# Patient Record
Sex: Female | Born: 1955 | Race: Black or African American | Hispanic: No | Marital: Single | Smoking: Never smoker
Health system: Southern US, Community
[De-identification: ages and names within clinical notes are randomized; demographics above are authoritative.]

## PROBLEM LIST (undated history)

## (undated) DIAGNOSIS — I739 Peripheral vascular disease, unspecified: Secondary | ICD-10-CM

## (undated) DIAGNOSIS — E785 Hyperlipidemia, unspecified: Secondary | ICD-10-CM

## (undated) DIAGNOSIS — A630 Anogenital (venereal) warts: Secondary | ICD-10-CM

## (undated) DIAGNOSIS — I219 Acute myocardial infarction, unspecified: Secondary | ICD-10-CM

## (undated) DIAGNOSIS — I10 Essential (primary) hypertension: Secondary | ICD-10-CM

## (undated) DIAGNOSIS — Z95820 Peripheral vascular angioplasty status with implants and grafts: Secondary | ICD-10-CM

## (undated) HISTORY — DX: Acute myocardial infarction, unspecified: I21.9

## (undated) HISTORY — DX: Anogenital (venereal) warts: A63.0

## (undated) HISTORY — DX: Peripheral vascular angioplasty status with implants and grafts: Z95.820

## (undated) HISTORY — DX: Hyperlipidemia, unspecified: E78.5

## (undated) HISTORY — DX: Peripheral vascular disease, unspecified: I73.9

## (undated) HISTORY — DX: Essential (primary) hypertension: I10

---

## 2006-07-08 HISTORY — PX: ANGIOPLASTY / STENTING FEMORAL: SUR30

## 2008-08-31 ENCOUNTER — Encounter: Admission: RE | Admit: 2008-08-31 | Discharge: 2008-08-31 | Payer: Self-pay | Admitting: General Practice

## 2009-09-04 ENCOUNTER — Ambulatory Visit: Payer: Self-pay | Admitting: Surgery

## 2009-09-11 ENCOUNTER — Ambulatory Visit: Payer: Self-pay | Admitting: Surgery

## 2009-09-11 ENCOUNTER — Encounter: Admission: RE | Admit: 2009-09-11 | Discharge: 2009-09-11 | Payer: Self-pay | Admitting: Surgery

## 2010-10-02 ENCOUNTER — Other Ambulatory Visit: Payer: Self-pay | Admitting: Orthopedic Surgery

## 2010-10-02 DIAGNOSIS — M25511 Pain in right shoulder: Secondary | ICD-10-CM

## 2010-10-08 ENCOUNTER — Other Ambulatory Visit: Payer: Self-pay

## 2010-10-13 ENCOUNTER — Other Ambulatory Visit: Payer: Self-pay

## 2010-10-15 ENCOUNTER — Ambulatory Visit
Admission: RE | Admit: 2010-10-15 | Discharge: 2010-10-15 | Disposition: A | Discharge status: Home / Self Care | Payer: BC Managed Care – PPO | Source: Ambulatory Visit | Attending: Orthopedic Surgery | Admitting: Orthopedic Surgery

## 2010-10-15 DIAGNOSIS — M25511 Pain in right shoulder: Secondary | ICD-10-CM

## 2010-10-30 ENCOUNTER — Encounter (HOSPITAL_COMMUNITY)
Admission: RE | Admit: 2010-10-30 | Discharge: 2010-10-30 | Disposition: A | Discharge status: Home / Self Care | Payer: BC Managed Care – PPO | Source: Ambulatory Visit | Attending: Orthopedic Surgery | Admitting: Orthopedic Surgery

## 2010-10-30 ENCOUNTER — Other Ambulatory Visit (HOSPITAL_COMMUNITY): Payer: Self-pay | Admitting: Orthopedic Surgery

## 2010-10-30 DIAGNOSIS — M7540 Impingement syndrome of unspecified shoulder: Secondary | ICD-10-CM

## 2010-10-30 LAB — COMPREHENSIVE METABOLIC PANEL
ALT: 33 U/L (ref 0–35)
AST: 25 U/L (ref 0–37)
Albumin: 4 g/dL (ref 3.5–5.2)
Alkaline Phosphatase: 68 U/L (ref 39–117)
BUN: 28 mg/dL — ABNORMAL HIGH (ref 6–23)
CO2: 29 mEq/L (ref 19–32)
Calcium: 10.1 mg/dL (ref 8.4–10.5)
Chloride: 109 mEq/L (ref 96–112)
Creatinine, Ser: 0.87 mg/dL (ref 0.4–1.2)
GFR calc Af Amer: >60 mL/min (ref >60)
GFR calc non Af Amer: >60 mL/min (ref >60)
Glucose, Bld: 102 mg/dL — ABNORMAL HIGH (ref 70–99)
Potassium: 4.8 mEq/L (ref 3.5–5.1)
Sodium: 143 mEq/L (ref 135–145)
Total Bilirubin: 0.3 mg/dL (ref 0.3–1.2)
Total Protein: 6.7 g/dL (ref 6.0–8.3)

## 2010-10-30 LAB — URINALYSIS, ROUTINE W REFLEX MICROSCOPIC
Bilirubin Urine: NEGATIVE
Glucose, UA: NEGATIVE mg/dL
Hgb urine dipstick: NEGATIVE
Ketones, ur: NEGATIVE mg/dL
Nitrite: NEGATIVE
Protein, ur: NEGATIVE mg/dL
Specific Gravity, Urine: 1.025 (ref 1.005–1.030)
Urobilinogen, UA: 0.2 mg/dL (ref 0.0–1.0)
pH: 5 (ref 5.0–8.0)

## 2010-10-30 LAB — URINE MICROSCOPIC-ADD ON

## 2010-10-30 LAB — CBC
HCT: 45.2 % (ref 36.0–46.0)
Hemoglobin: 15.1 g/dL — ABNORMAL HIGH (ref 12.0–15.0)
MCH: 28.1 pg (ref 26.0–34.0)
MCHC: 33.4 g/dL (ref 30.0–36.0)
MCV: 84 fL (ref 78.0–100.0)
Platelets: 207 10*3/uL (ref 150–400)
RBC: 5.38 MIL/uL — ABNORMAL HIGH (ref 3.87–5.11)
RDW: 14.5 % (ref 11.5–15.5)
WBC: 9.5 10*3/uL (ref 4.0–10.5)

## 2010-10-30 LAB — PROTIME-INR
INR: 0.92 (ref 0.00–1.49)
Prothrombin Time: 12.6 seconds (ref 11.6–15.2)

## 2010-10-30 LAB — SURGICAL PCR SCREEN
MRSA, PCR: NEGATIVE
Staphylococcus aureus: NEGATIVE

## 2010-10-30 LAB — APTT: aPTT: 25 seconds (ref 24–37)

## 2010-11-05 ENCOUNTER — Ambulatory Visit (HOSPITAL_COMMUNITY)
Admission: RE | Admit: 2010-11-05 | Discharge: 2010-11-05 | Disposition: A | Discharge status: Home / Self Care | Payer: BC Managed Care – PPO | Source: Ambulatory Visit | Attending: Orthopedic Surgery | Admitting: Orthopedic Surgery

## 2010-11-05 ENCOUNTER — Ambulatory Visit (HOSPITAL_BASED_OUTPATIENT_CLINIC_OR_DEPARTMENT_OTHER)
Admission: RE | Admit: 2010-11-05 | Discharge: 2010-11-05 | Disposition: A | Discharge status: Home / Self Care | Payer: BC Managed Care – PPO | Source: Ambulatory Visit | Attending: Orthopedic Surgery | Admitting: Orthopedic Surgery

## 2010-11-05 DIAGNOSIS — M67919 Unspecified disorder of synovium and tendon, unspecified shoulder: Secondary | ICD-10-CM | POA: Insufficient documentation

## 2010-11-05 DIAGNOSIS — Z01812 Encounter for preprocedural laboratory examination: Secondary | ICD-10-CM | POA: Insufficient documentation

## 2010-11-05 DIAGNOSIS — M719 Bursopathy, unspecified: Secondary | ICD-10-CM | POA: Insufficient documentation

## 2010-11-05 DIAGNOSIS — I739 Peripheral vascular disease, unspecified: Secondary | ICD-10-CM | POA: Insufficient documentation

## 2010-11-05 DIAGNOSIS — M19019 Primary osteoarthritis, unspecified shoulder: Secondary | ICD-10-CM | POA: Insufficient documentation

## 2010-11-05 DIAGNOSIS — M25819 Other specified joint disorders, unspecified shoulder: Secondary | ICD-10-CM | POA: Insufficient documentation

## 2010-11-05 DIAGNOSIS — E669 Obesity, unspecified: Secondary | ICD-10-CM | POA: Insufficient documentation

## 2010-11-05 DIAGNOSIS — Z01810 Encounter for preprocedural cardiovascular examination: Secondary | ICD-10-CM | POA: Insufficient documentation

## 2010-11-05 DIAGNOSIS — Z01818 Encounter for other preprocedural examination: Secondary | ICD-10-CM | POA: Insufficient documentation

## 2010-11-05 DIAGNOSIS — I10 Essential (primary) hypertension: Secondary | ICD-10-CM | POA: Insufficient documentation

## 2010-11-13 NOTE — Op Note (Signed)
  NAMETRU, LEOPARD               ACCOUNT NO.:  [id number]  MEDICAL RECORD NO.:  [id number]           PATIENT TYPE:  O  LOCATION:                                FACILITY:  [facility]  PHYSICIAN:  Loreta Ave, M.D. DATE OF BIRTH:  02/20/1956  DATE OF PROCEDURE:  11/07/2010 DATE OF DISCHARGE:  11/05/2010                              OPERATIVE REPORT   PREOPERATIVE DIAGNOSES:  Right shoulder chronic impingement, partial rotator cuff tear, distal clavicle osteolysis.  POSTOPERATIVE DIAGNOSES:  Right shoulder chronic impingement, partial rotator cuff tear, distal clavicle osteolysis with very superficial tearing rotator cuff above.  PROCEDURES:  Right shoulder exam under anesthesia, arthroscopy with bursectomy, rotator cuff debridement, acromioplasty, CA ligament release, excision of distal clavicle.  SURGEON:  Loreta Ave, MD  ASSISTANT:  Zonia Kief, PA present throughout the entire case and necessary for timely completion of procedure.  ANESTHESIA:  General.  ESTIMATED BLOOD LOSS:  Minimal.  SPECIMENS:  None.  CULTURES:  None.  COMPLICATIONS:  None.  DRESSINGS:  Soft compressive with sling.  PROCEDURE:  The patient was brought to the operating room, placed on the operating table in a supine position.  After adequate anesthesia had been obtained, shoulder examined.  Full motion stable shoulder.  Placed in beach-chair position on the shoulder positioner.  Prepped and draped in usual sterile fashion.  Three portals anterior, posterior, and lateral.  Arthroscope introduced, shoulder distended and inspected.  The interior of the shoulder looked good.  Articular cartilage, biceps tendon, biceps anchor, labrum intact.  A little roughening on the undersurface of the cuff where she has tendinopathy, but no structural tearing seen from below.  Articular cartilage looked good.  Cannula redirected subacromially.  Type 2 to 3 acromion.  Bursa resected. Abrasive changes on  the top of the cuff debrided.  Acromioplasty to a type 1 acromion with shaver and high-speed bur.  CA ligament released with cautery.  Adequacy of decompression confirmed.  Distal clavicle grade 4 changes.  Periarticular spurs.  Periarticular spurs and lateral centimeter of clavicle resected.  Adequacy of decompression confirmed viewing from all portals.  Top of the cuff thoroughly inspected.  No other repair necessary.  Instruments and fluid removed.  Portals of shoulder bursa injected with Marcaine.  Portals closed with nylon.  Sterile compressive dressing applied.  Sling applied.  Anesthesia reversed.  Brought to the recovery room.  Tolerated surgery well.  No complications.     Loreta Ave, M.D.     DFM/MEDQ  D:  11/07/2010  T:  11/08/2010  Job:  [id number]  Electronically Signed by Mckinley Jewel M.D. on 11/13/2010 01:32:22 PM

## 2010-11-20 NOTE — Assessment & Plan Note (Signed)
OFFICE VISIT   Debbie [name], Debbie [name]  DOB:  [birth year]                                       09/11/2009  CHART#:[id number]   REASON FOR VISIT:  Follow-up.   HISTORY:  Patient is a 55 year old female that came in to see me last  week at the request of Dr. Eulah Pont for evaluation of right leg numbness.  This was a new finding for her.  It happened while she was working out  with a Systems analyst.  She has undergone percutaneous intervention in  her right leg in [place].  This was a Viabahn stent to her right SFA  and popliteal artery.  Her ABIs when I last saw her were 0.6 on the  right and 1 on the left.  She also had a small ulcer on her ankle.  I  had planned to get an arteriogram; however, she said that she had  bleeding problems in the past with an arteriogram; therefore, I had  scheduled her for a CT scans instead.  She comes back in today to  discuss the results.  The patient states that she gets numbness with  walking around.  When she rests, it goes away.  She is not having any  acute ischemic symptoms.   PHYSICAL EXAMINATION:  Heart rate 87.  Blood pressure is 136/83.  Respirations are 20.  General:  She is in no acute distress.  Respirations are not labored.  Extremities are warm and well-perfused.  The ulcer that was present last week atraumatically improved.   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   ASSESSMENT:  Right leg numbness.   PLAN:  I certainly feel that with the occlusion of her right leg stent,  that some of her symptoms are related to arterial insufficiency.  She  does not, however, given off the impression that this is limb-  threatening.  Her symptoms appear to be relatively stable and mild.  She  is not having any rest pain, and she has been able to heal a wound on  her ankle.  I discussed that I think our best course of action is  continued exercise, to help  improve the distance walking she can  tolerate.  I have also given her prescription for cilostazol to see if  this helps her symptoms.  I will plan on seeing her back in 6 months or  sooner if she has worsening problems.  I have also told her to continue  to explore her back as being a potential source of the numbness.     Jorge Ny, MD  Electronically Signed   VWB/MEDQ  D:  09/11/2009  T:  09/12/2009  Job:  [id number]   cc:   Loreta Ave, M.D.

## 2010-11-20 NOTE — Assessment & Plan Note (Signed)
OFFICE VISIT   Debbie [name], Debbie [name]  DOB:  March 10, 1956                                       09/04/2009  CHART#:[id number]   REASON FOR VISIT:  Right leg numbness.   REFERRING PHYSICIAN:  Loreta Ave, M.D.   HISTORY:  This is a 55 year old female who I am seeing at request of Dr.  Eulah Pont for evaluation of right leg numbness and pain.  The patient  states this has been going on since this past Friday.  It happened while  she was working with her Systems analyst.  Currently she is doing  multiple exercises, including lunges, which after her work out, she  suffers numbness in her right leg.  She is also getting numbness at home  without significant activity.  Her feet feel better after icing them  following her workouts.   The patient has a history of a nonhealing right lateral ankle stasis  ulcer in [place], [place].  She underwent placement of a right above-  knee 6 x 10 Viabahn stent following suboptimal results from a popliteal  angioplasty.  Operative findings from that case included an occluded  right above-knee popliteal artery, as well as a tibioperoneal trunk  occlusion and single-vessel runoff via the peroneal artery.  Patient  also says that she has been told she has degenerative disk disease that  could potentially causing her symptoms.   REVIEW OF SYSTEMS:  Positive for weight loss, otherwise negative except  for what is documented above, negative x 10 and detailed in the  encounter form.   PAST MEDICAL HISTORY:  Hypercholesterolemia, history of an MI.   FAMILY HISTORY:  Negative for cardiovascular disease at an early age.   SOCIAL HISTORY:  She is single.  She works as an Charity fundraiser.  Does not smoke,  does not drink.   ALLERGIES:  Sulfa.   PHYSICAL EXAMINATION:  Heart rate is 69, blood pressure 147/90,  temperature is 98.2.  General:  She is well-appearing in no distress.  HEENT:  Within normal limits.  Respirations are nonlabored.  Her  abdomen  is obese.  Musculoskeletal is without major deformities.  Neuro:  She  has no focal deficits.  Skin without rash.  Extremities are warm and  well-perfused.  Pedal pulses are not palpable on the right.   DIAGNOSTIC STUDIES:  Ultrasound has been independently reviewed.  This  reveals an ABI of 0.6 on the right and 1.0 on the left.   ASSESSMENT:  Right leg numbness.   PLAN:  I have recommended that we proceed with arteriogram to further  delineate the patient's anatomy, given that she has significant  atherosclerotic changes in her right leg.  She told me that her of  procedure in [place] was complicated by significant left groin hematoma  which required inpatient hospitalization.  She is a little reluctant to  proceed with arteriogram; therefore, I have recommended that we obtain a  CT angiogram of the abdomen and pelvis with bilateral runoff to serve as  our baseline.  I stated that if her CT angiogram shows reveals the  findings that are detailed in the operative report from her initial  study in 2008, I feel that her symptoms would be most likely neuropathic  in origin and that we should focus on the back.  If however we find that  her Viobahn stent  is occluded, we would need to proceed with an  arteriogram to figure out our next options to revascularize her leg.  I  have encouraged her to continue with her exercises.  She will not do any  damage by continuing to exercise.  I will see her back in a week after  her study.     Jorge Ny, MD  Electronically Signed   VWB/MEDQ  D:  09/04/2009  T:  09/05/2009  Job:  [id number]   cc:   Loreta Ave, M.D.

## 2011-08-01 ENCOUNTER — Ambulatory Visit (INDEPENDENT_AMBULATORY_CARE_PROVIDER_SITE_OTHER): Payer: No Typology Code available for payment source | Admitting: Vascular Surgery

## 2011-08-01 ENCOUNTER — Other Ambulatory Visit (INDEPENDENT_AMBULATORY_CARE_PROVIDER_SITE_OTHER): Payer: BC Managed Care – PPO | Admitting: *Deleted

## 2011-08-01 ENCOUNTER — Encounter: Payer: Self-pay | Admitting: Vascular Surgery

## 2011-08-01 ENCOUNTER — Other Ambulatory Visit: Payer: Self-pay | Admitting: *Deleted

## 2011-08-01 VITALS — BP 153/82 | HR 84 | Resp 16 | Ht 65.0 in | Wt 279.0 lb

## 2011-08-01 DIAGNOSIS — Z9861 Coronary angioplasty status: Secondary | ICD-10-CM

## 2011-08-01 DIAGNOSIS — M79609 Pain in unspecified limb: Secondary | ICD-10-CM | POA: Insufficient documentation

## 2011-08-01 DIAGNOSIS — Z95820 Peripheral vascular angioplasty status with implants and grafts: Secondary | ICD-10-CM | POA: Insufficient documentation

## 2011-08-01 DIAGNOSIS — I739 Peripheral vascular disease, unspecified: Secondary | ICD-10-CM

## 2011-08-01 DIAGNOSIS — I70219 Atherosclerosis of native arteries of extremities with intermittent claudication, unspecified extremity: Secondary | ICD-10-CM

## 2011-08-01 NOTE — Progress Notes (Signed)
[facility] [place] HISTORY AND PHYSICAL   History of Present Illness:  Patient is a 55 y.o. year old female who presents for evaluation of right leg pain. The patient was previously seen by Dr. Brabham in March of 2011. At that time she was noted to have occlusion of her right superficial femoral artery Viabahn stent. This had been placed in [place] and 2008. At that time Dr. Brabham opted for conservative management with Pletal and a walking program. The patient was initially compliant with this and lost approximately 50 pounds. However she began to have some problems with other physical ailments and stopped walking. She has noticed worsening symptoms in her right leg since that time. She is also regained all of her weight. She has some complaints of aching this and tingling in her right foot at night, and she sleeping. She gets up and walks around this improves. She has not really ambulatory enough to really describe classic claudication symptoms. She does have some vague complaints of calf pain with walking. She states her symptoms got worse over the last few days.  Other medical problems include hypertension. She also has a history of hyperlipidemia and coronary artery disease. These apparently are stable currently. She states that she was on a statin in the past but stopped this because she was worried about potential side effects.  She does take aspirin.  Past Medical History  Diagnosis Date  . Peripheral arterial disease   . Status post angioplasty with stent     Known femoral stent occlusion  . Myocardial infarction   . Hyperlipidemia     Past Surgical History  Procedure Date  . Angioplasty / stenting femoral 2008    Right femoral artery stenting done in [place]     Social History History  Substance Use Topics  . Smoking status: Never Smoker   . Smokeless tobacco: Not on file  . Alcohol Use: No    Family History No family history on  file.  Allergies  Allergies  Allergen Reactions  . Sulfa Antibiotics      No current outpatient prescriptions on file.    ROS:   General:  No weight loss, Fever, chills  HEENT: No recent headaches, no nasal bleeding, no visual changes, no sore throat  Neurologic: No dizziness, blackouts, seizures. No recent symptoms of stroke or mini- stroke. No recent episodes of slurred speech, or temporary blindness.  Cardiac: No recent episodes of chest pain/pressure, no shortness of breath at rest.  No shortness of breath with exertion.  Denies history of atrial fibrillation or irregular heartbeat  Vascular: No history of rest pain in feet.  No history of claudication.  No history of non-healing ulcer, No history of DVT   Pulmonary: No home oxygen, no productive cough, no hemoptysis,  No asthma or wheezing  Musculoskeletal:  [ ] Arthritis, [ ] Low back pain,  [ ] Joint pain  Hematologic:No history of hypercoagulable state.  No history of easy bleeding.  No history of anemia  Gastrointestinal: No hematochezia or melena,  No gastroesophageal reflux, no trouble swallowing  Urinary: [ ] chronic Kidney disease, [ ] on HD - [ ] MWF or [ ] TTHS, [ ] Burning with urination, [ ] Frequent urination, [ ] Difficulty urinating;   Skin: No rashes  Psychological: No history of anxiety,  No history of depression   Physical Examination  Filed Vitals:   08/01/11 1132  BP: 153/82  Pulse: 84  Resp: 16    Height: 5' 5" (1.651 m)  Weight: 279 lb (126.554 kg)  SpO2: 97%    Body mass index is 46.43 kg/(m^2).  General:  Alert and oriented, no acute distress HEENT: Normal Neck: No bruit or JVD Pulmonary: Clear to auscultation bilaterally Cardiac: Regular Rate and Rhythm without murmur Abdomen: Soft, non-tender, non-distended, no mass, no scars, very obese Skin: No rash Extremity Pulses:  2+ radial, brachial, femoral, dorsalis pedis, posterior tibial pulses left leg, right leg absent posterior  tibial and dorsalis pedis pulse Musculoskeletal: No deformity or edema  Neurologic: Upper and lower extremity motor 5/5 and symmetric  DATA: She had bilateral ABIs performed today which are reviewed and interpreted. Left ABI was 0.99 with biphasic waveforms right ABI was 0.39 which is a decline of approximately 30% since her previous ABI of 0.6 one year ago   ASSESSMENT: Claudication right lower extremity with early rest pain, obesity, multiple risk factors for peripheral arterial disease   PLAN: Risks benefits possible complications and procedure details of arteriogram was explained to the patient today. I believe she needs this for operative planning purposes. These risks were including but limited to bleeding infection vessel injury contrast reaction. We will also schedule her for a cardiology risk stratification visit. She will most likely need a right femoropopliteal bypass to improve perfusion in her right lower extremity since she currently has rest pain.  Kristi Norment, MD [facility] [place] Office: [phone] Pager: [phone]   Tieasha Larsen, MD [facility] [place] Office: [phone] Pager: [phone]  

## 2011-08-02 ENCOUNTER — Other Ambulatory Visit: Payer: Self-pay

## 2011-08-02 ENCOUNTER — Ambulatory Visit: Payer: BC Managed Care – PPO | Admitting: Cardiovascular Disease

## 2011-08-06 ENCOUNTER — Encounter (HOSPITAL_COMMUNITY): Payer: Self-pay | Admitting: Pharmacy Technician

## 2011-08-08 MED ORDER — SODIUM CHLORIDE 0.9 % IV SOLN
INTRAVENOUS | Status: DC
  Administered 2011-08-09: 1000 mL via INTRAVENOUS

## 2011-08-09 ENCOUNTER — Encounter (HOSPITAL_COMMUNITY)
Admission: RE | Disposition: A | Discharge status: Home / Self Care | Payer: Self-pay | Source: Ambulatory Visit | Attending: Vascular Surgery

## 2011-08-09 ENCOUNTER — Ambulatory Visit (HOSPITAL_COMMUNITY)
Admission: RE | Admit: 2011-08-09 | Discharge: 2011-08-09 | Disposition: A | Discharge status: Home / Self Care | Payer: BC Managed Care – PPO | Source: Ambulatory Visit | Attending: Vascular Surgery | Admitting: Vascular Surgery

## 2011-08-09 ENCOUNTER — Other Ambulatory Visit: Payer: Self-pay

## 2011-08-09 DIAGNOSIS — E785 Hyperlipidemia, unspecified: Secondary | ICD-10-CM | POA: Insufficient documentation

## 2011-08-09 DIAGNOSIS — I70219 Atherosclerosis of native arteries of extremities with intermittent claudication, unspecified extremity: Secondary | ICD-10-CM

## 2011-08-09 DIAGNOSIS — I10 Essential (primary) hypertension: Secondary | ICD-10-CM | POA: Insufficient documentation

## 2011-08-09 DIAGNOSIS — I739 Peripheral vascular disease, unspecified: Secondary | ICD-10-CM

## 2011-08-09 DIAGNOSIS — E669 Obesity, unspecified: Secondary | ICD-10-CM | POA: Insufficient documentation

## 2011-08-09 DIAGNOSIS — I252 Old myocardial infarction: Secondary | ICD-10-CM | POA: Insufficient documentation

## 2011-08-09 DIAGNOSIS — I2510 Atherosclerotic heart disease of native coronary artery without angina pectoris: Secondary | ICD-10-CM | POA: Insufficient documentation

## 2011-08-09 HISTORY — PX: FEMORAL BYPASS: SHX50

## 2011-08-09 HISTORY — PX: ABDOMINAL AORTAGRAM: SHX5454

## 2011-08-09 LAB — POCT I-STAT, CHEM 8
BUN: 24 mg/dL — ABNORMAL HIGH (ref 6–23)
Calcium, Ion: 1.21 mmol/L (ref 1.12–1.32)
Chloride: 111 mEq/L (ref 96–112)
Creatinine, Ser: 0.9 mg/dL (ref 0.50–1.10)
Glucose, Bld: 118 mg/dL — ABNORMAL HIGH (ref 70–99)
HCT: 41 % (ref 36.0–46.0)
Hemoglobin: 13.9 g/dL (ref 12.0–15.0)
Potassium: 3.7 mEq/L (ref 3.5–5.1)
Sodium: 145 mEq/L (ref 135–145)
TCO2: 24 mmol/L (ref 0–100)

## 2011-08-09 LAB — HEMOGLOBIN A1C
Hgb A1c MFr Bld: 6.8 % — ABNORMAL HIGH (ref <5.7)
Mean Plasma Glucose: 148 mg/dL — ABNORMAL HIGH (ref <117)

## 2011-08-09 LAB — GLUCOSE, CAPILLARY: Glucose-Capillary: 109 mg/dL — ABNORMAL HIGH (ref 70–99)

## 2011-08-09 SURGERY — ABDOMINAL AORTAGRAM
Anesthesia: LOCAL

## 2011-08-09 MED ORDER — LIDOCAINE HCL (PF) 1 % IJ SOLN
INTRAMUSCULAR | Status: AC
  Filled 2011-08-09: qty 30

## 2011-08-09 MED ORDER — HEPARIN (PORCINE) IN NACL 2-0.9 UNIT/ML-% IJ SOLN
INTRAMUSCULAR | Status: AC
  Filled 2011-08-09: qty 1000

## 2011-08-09 MED ORDER — MIDAZOLAM HCL 2 MG/2ML IJ SOLN
INTRAMUSCULAR | Status: AC
  Filled 2011-08-09: qty 2

## 2011-08-09 MED ORDER — FENTANYL CITRATE 0.05 MG/ML IJ SOLN
INTRAMUSCULAR | Status: AC
  Filled 2011-08-09: qty 2

## 2011-08-09 NOTE — H&P (View-Only) (Signed)
[facility] [place] HISTORY AND PHYSICAL   History of Present Illness:  Patient is a 56 y.o. year old female who presents for evaluation of right leg pain. The patient was previously seen by Dr. Myra Gianotti in March of 2011. At that time she was noted to have occlusion of her right superficial femoral artery Viabahn stent. This had been placed in [place] and 2008. At that time Dr. Myra Gianotti opted for conservative management with Pletal and a walking program. The patient was initially compliant with this and lost approximately 50 pounds. However she began to have some problems with other physical ailments and stopped walking. She has noticed worsening symptoms in her right leg since that time. She is also regained all of her weight. She has some complaints of aching this and tingling in her right foot at night, and she sleeping. She gets up and walks around this improves. She has not really ambulatory enough to really describe classic claudication symptoms. She does have some vague complaints of calf pain with walking. She states her symptoms got worse over the last few days.  Other medical problems include hypertension. She also has a history of hyperlipidemia and coronary artery disease. These apparently are stable currently. She states that she was on a statin in the past but stopped this because she was worried about potential side effects.  She does take aspirin.  Past Medical History  Diagnosis Date  . Peripheral arterial disease   . Status post angioplasty with stent     Known femoral stent occlusion  . Myocardial infarction   . Hyperlipidemia     Past Surgical History  Procedure Date  . Angioplasty / stenting femoral 2008    Right femoral artery stenting done in [place]     Social History History  Substance Use Topics  . Smoking status: Never Smoker   . Smokeless tobacco: Not on file  . Alcohol Use: No    Family History No family history on  file.  Allergies  Allergies  Allergen Reactions  . Sulfa Antibiotics      No current outpatient prescriptions on file.    ROS:   General:  No weight loss, Fever, chills  HEENT: No recent headaches, no nasal bleeding, no visual changes, no sore throat  Neurologic: No dizziness, blackouts, seizures. No recent symptoms of stroke or mini- stroke. No recent episodes of slurred speech, or temporary blindness.  Cardiac: No recent episodes of chest pain/pressure, no shortness of breath at rest.  No shortness of breath with exertion.  Denies history of atrial fibrillation or irregular heartbeat  Vascular: No history of rest pain in feet.  No history of claudication.  No history of non-healing ulcer, No history of DVT   Pulmonary: No home oxygen, no productive cough, no hemoptysis,  No asthma or wheezing  Musculoskeletal:  [ ]  Arthritis, [ ]  Low back pain,  [ ]  Joint pain  Hematologic:No history of hypercoagulable state.  No history of easy bleeding.  No history of anemia  Gastrointestinal: No hematochezia or melena,  No gastroesophageal reflux, no trouble swallowing  Urinary: [ ]  chronic Kidney disease, [ ]  on HD - [ ]  MWF or [ ]  TTHS, [ ]  Burning with urination, [ ]  Frequent urination, [ ]  Difficulty urinating;   Skin: No rashes  Psychological: No history of anxiety,  No history of depression   Physical Examination  Filed Vitals:   08/01/11 1132  BP: 153/82  Pulse: 84  Resp: 16  Height: 5\' 5"  (1.651 m)  Weight: 279 lb (126.554 kg)  SpO2: 97%    Body mass index is 46.43 kg/(m^2).  General:  Alert and oriented, no acute distress HEENT: Normal Neck: No bruit or JVD Pulmonary: Clear to auscultation bilaterally Cardiac: Regular Rate and Rhythm without murmur Abdomen: Soft, non-tender, non-distended, no mass, no scars, very obese Skin: No rash Extremity Pulses:  2+ radial, brachial, femoral, dorsalis pedis, posterior tibial pulses left leg, right leg absent posterior  tibial and dorsalis pedis pulse Musculoskeletal: No deformity or edema  Neurologic: Upper and lower extremity motor 5/5 and symmetric  DATA: She had bilateral ABIs performed today which are reviewed and interpreted. Left ABI was 0.99 with biphasic waveforms right ABI was 0.39 which is a decline of approximately 30% since her previous ABI of 0.6 one year ago   ASSESSMENT: Claudication right lower extremity with early rest pain, obesity, multiple risk factors for peripheral arterial disease   PLAN: Risks benefits possible complications and procedure details of arteriogram was explained to the patient today. I believe she needs this for operative planning purposes. These risks were including but limited to bleeding infection vessel injury contrast reaction. We will also schedule her for a cardiology risk stratification visit. She will most likely need a right femoropopliteal bypass to improve perfusion in her right lower extremity since she currently has rest pain.  Fabienne Bruns, MD [facility] [place] Office: [phone] Pager: [phone]   Fabienne Bruns, MD [facility] [place] Office: [phone] Pager: [phone]

## 2011-08-09 NOTE — Op Note (Signed)
Procedure: Aortogram with bilateral lower extremity runoff  Preoperative diagnosis: Claudication  Postoperative diagnosis: Same  Anesthesia Local  Operative details: After obtaining informed consent, the patient was taken to the [facility] LAB. The patient was placed in supine position on the Angio table. Both groins were prepped and draped in usual sterile fashion. Local anesthesia was infiltrated over the left common femoral artery. Initially, ultrasound was used to identify the common femoral artery and an attempt was made to use a micropuncture to establish access.  After 2 passes I felt that I could not visualized the artery very well so I reverted to a standard technique of palpation.  An introducer needle was used to cannulate the left common femoral artery and 035 versacore wire threaded into the abdominal aorta under fluoroscopic guidance. Next a 5 French sheath is placed over the guidewire in the left common femoral artery. A 5 French pigtail catheter was placed over the guidewire into the abdominal aorta and abdominal aortogram was obtained. The infrarenal abdominal aorta is patent. The left and right common internal and external iliac arteries are patent.    In order to get increased opacification of the tibials a 5 Fr crossover catheter was brought up on the field.  The crossover catheter was used to selectively catheterize the right common iliac artery and the guidewire advanced into the right distal external iliac artery. The crossover catheter was removed and replaced with a 5 French straight catheter. Angiogram was then performed the right lower extremity.  In the right leg, the common femoral artery is patent.  The right SFA has a Viabahn stent in its distal portion which is occluded. The stent extends into the above knee popliteal artery which is also occluded.  The profunda is patent.  The below knee popliteal artery is patent and reconstituted by SFA and profunda collaterals. The posterior  tibial artery is occluded.  The peroneal artery is patent to the ankle but diseased diffusely.  The anterior tibial artery is occluded at its origin but refills distally via collaterals.  It is a small vessel.  The peroneal artery is the primary runoff vessel and fills the DP artery which does fill a complete plantar arch.    Next the 5 French straight catheter was removed over a guidewire. The 5 French sheath was then connected to obtain a left lower extremity runoff.  In the left leg, the common femoral artery is patent.  The left SFA is patent. The profunda is patent.  The popliteal artery is patent. The posterior tibial artery is occluded.  The peroneal artery is occluded at its origin but does fill late via collaterals but is diminutive.  The anterior tibial artery is the dominant runoff to the foot. This is in continuity to the DP.  An attempt was then made to use a Proglide suture device.  I place the [employer] through the 5 Fr sheath and exchanged for a 6 Fr Proglide.  However, due to the patients obesity the proglide was bowing in its central portion and would not easily advance into the artery so attempt at closure was aborted.  The Proglide was removed over the [facility] wire and a 6 Fr sheath placed in the left femoral artery.  The 6 Fr sheath was left in place to be pulled in the holding area. The patient tolerated the procedure well and there were no complications. Patient was taken to the holding area in stable condition.  Operative findings: Left leg- patent inflow, SFA,  profunda, popliteal, 1 vessel runoff via the peroneal Right leg- SFA occlusion, patent below knee popliteal one vessel runoff via the AT There is a complete plantar arch.     Operative management: The patient will be scheduled for a right femoral to below knee bypass in the near future.  Fabienne Bruns, MD [facility] [place] Office: [phone] Pager: [phone]

## 2011-08-09 NOTE — Interval H&P Note (Signed)
History and Physical Interval Note:  08/09/2011 7:58 AM  Debbie [name]  has presented today for surgery, with the diagnosis of PVD  The various methods of treatment have been discussed with the patient and family. After consideration of risks, benefits and other options for treatment, the patient has consented to  Procedure(s): ABDOMINAL AORTAGRAM as a surgical intervention .  The patients' history has been reviewed, patient examined, no change in status, stable for surgery.  I have reviewed the patients' chart and labs.  Questions were answered to the patient's satisfaction.     Gessica Jawad E

## 2011-08-12 ENCOUNTER — Encounter: Payer: Self-pay | Admitting: Vascular Surgery

## 2011-08-12 ENCOUNTER — Ambulatory Visit: Payer: BC Managed Care – PPO | Admitting: Surgery

## 2011-08-12 DIAGNOSIS — E119 Type 2 diabetes mellitus without complications: Secondary | ICD-10-CM | POA: Insufficient documentation

## 2011-08-20 ENCOUNTER — Ambulatory Visit: Payer: BC Managed Care – PPO | Admitting: Cardiovascular Disease

## 2011-08-28 ENCOUNTER — Telehealth: Payer: Self-pay | Admitting: Vascular Surgery

## 2011-08-28 NOTE — Telephone Encounter (Addendum)
Patient reports increased difficulty walking and having to get up every two hours due to tingling, numbness, and swelling in her right toes, spreading to her entire right foot.  She is not resting at night and she feels this is causing her to have problems with sleepiness and depression during the day.  She is on the waiting list for [facility] for cancellations before her currently scheduled appointment on March 5th with Dr. Clifton James, but she wants to be seen sooner if there is another cardiologist who can see her at another practice or if Dr. Darrick Penna is willing to call a [place] doctor to discuss her case and see if she can have an earlier appointment.  She understands that she has cancelled previous appointments, but her symptoms have increased over since then.  She saw a provider at [facility] for a second opinion who told her to go to the emergency room if her toes or foot  turned blue.  She also asks if bypass is her only viable option - she has researched some other treatments and wants to know if they are a possibility.  Her next appointment is with Dr. Darrick Penna on March 7th.  She reports that she has never had a myocardial infarction of which she is aware and so that should be taken out of her past medical history.    When her records were released to her appointment at [facility], she noticed a diagnosis of diabetes in her record (it is on her problem list).  She reports that she has not been told that she has diabetes and would like some follow-up on that. She is calling Dr. Wylene Simmer to confirm an appointment that she believes she has scheduled in March and would like the lab results sent to him.    This patient had an arteriogram recently and was set up for cardiac risk stratification appt. prior to lower extremity bypass.  She has shown up to our office on numerous occasions previously without an appointment demanding to be seen immediately.  She cancelled her recent cardiology evaluation and then  demanded to have the appt rescheduled.  She does have PAD and symptoms of early rest pain; however, I will not do her bypass without appropriate pre-operative cardiac work-up.  The patient was informed of her HGBA1C results on the day of her angiogram and a note and results forwarded to her primary MD.  Fabienne Bruns, MD [facility] Office: [phone] Pager: [phone]

## 2011-08-28 NOTE — Telephone Encounter (Signed)
Debbie [name] walked into clinic yesterday complaining of her right foot "keeping her up at night pacing the floor". She requested that we call around to different cardiologists to see if they could see her sooner. [name] called [facility] and they could not. She then called [facility] again and she was already scheduled for the first they had but was on a cancellation list to see anyone if an appt became available. She did say that she had an appt on Ash Wed but had to miss because of "church duties". I told her that if her toes, foot or leg became blue or cold to please go to the ER for treatment. I will discuss with Dr Darrick Penna Thursday during clinic.

## 2011-09-06 DIAGNOSIS — E785 Hyperlipidemia, unspecified: Secondary | ICD-10-CM | POA: Insufficient documentation

## 2011-09-06 DIAGNOSIS — R7303 Prediabetes: Secondary | ICD-10-CM | POA: Insufficient documentation

## 2011-09-10 ENCOUNTER — Ambulatory Visit: Payer: BC Managed Care – PPO | Admitting: Cardiovascular Disease

## 2011-09-11 ENCOUNTER — Encounter: Payer: Self-pay | Admitting: Vascular Surgery

## 2011-09-12 ENCOUNTER — Ambulatory Visit: Payer: BC Managed Care – PPO | Admitting: Vascular Surgery

## 2011-09-12 NOTE — Progress Notes (Signed)
Pt was a no show for appt today  Fabienne Bruns, MD [facility] [place] Office: [phone] Pager: [phone]

## 2012-05-13 DIAGNOSIS — E559 Vitamin D deficiency, unspecified: Secondary | ICD-10-CM | POA: Insufficient documentation

## 2014-06-16 ENCOUNTER — Encounter (HOSPITAL_COMMUNITY): Payer: Self-pay | Admitting: Vascular Surgery

## 2016-07-09 ENCOUNTER — Other Ambulatory Visit: Payer: Self-pay | Admitting: Family Medicine

## 2016-07-09 DIAGNOSIS — Z1239 Encounter for other screening for malignant neoplasm of breast: Secondary | ICD-10-CM

## 2016-08-12 ENCOUNTER — Ambulatory Visit (INDEPENDENT_AMBULATORY_CARE_PROVIDER_SITE_OTHER): Payer: BLUE CROSS/BLUE SHIELD | Admitting: Obstetrics & Gynecology

## 2016-08-12 ENCOUNTER — Encounter: Payer: Self-pay | Admitting: Obstetrics & Gynecology

## 2016-08-12 VITALS — BP 106/68 | HR 70 | Resp 14 | Ht 65.5 in | Wt 247.0 lb

## 2016-08-12 DIAGNOSIS — Z6841 Body Mass Index (BMI) 40.0 and over, adult: Secondary | ICD-10-CM | POA: Diagnosis not present

## 2016-08-12 DIAGNOSIS — E669 Obesity, unspecified: Secondary | ICD-10-CM

## 2016-08-12 DIAGNOSIS — Z01419 Encounter for gynecological examination (general) (routine) without abnormal findings: Secondary | ICD-10-CM

## 2016-08-12 DIAGNOSIS — Z0000 Encounter for general adult medical examination without abnormal findings: Secondary | ICD-10-CM | POA: Diagnosis not present

## 2016-08-12 DIAGNOSIS — Z124 Encounter for screening for malignant neoplasm of cervix: Secondary | ICD-10-CM | POA: Diagnosis not present

## 2016-08-12 DIAGNOSIS — IMO0001 Reserved for inherently not codable concepts without codable children: Secondary | ICD-10-CM

## 2016-08-12 LAB — POCT URINALYSIS DIPSTICK
Bilirubin, UA: NEGATIVE
Blood, UA: NEGATIVE
Glucose, UA: NEGATIVE
Ketones, UA: NEGATIVE
Leukocytes, UA: NEGATIVE
Nitrite, UA: NEGATIVE
Protein, UA: NEGATIVE
Urobilinogen, UA: NEGATIVE
pH, UA: 5

## 2016-08-12 MED ORDER — METRONIDAZOLE 0.75 % VA GEL: 1.0000 | Freq: Every day | VAGINAL | 0 refills | Status: DC

## 2016-08-12 NOTE — Progress Notes (Signed)
61 y.o. G1P1 SingleAfrican AmericanF here for annual exam.  She is a new pt who I have seen only once in the past.  It has been several years since I saw her last.  Pt states "I wouldn't let anyone do a pelvic exam on my until I saw you" but states she waited to see me again as she has worked on weight loss.    However, this has been complicated by h/o PVD.  She had a femoral bypass at [facility] in 2013.  Has extensive scar on RLE and had lengthy recovery according to her.  She was evaluated by cardiology for clearance for procedure.  Still sees vascular surgeon.  Did go to diet/nutrition program at [facility].  Has lost some weight but states she was 50 pound lighter than now.  Frustrated with self.  Speaks negatively about her self on several occasions.  Wants information about weight loss surgery and plastic surgery for pannus.  Denies vaginal bleeding.    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  PCP:  Dr. Johny Drilling, in [facility] MD at [facility]:  Dr. Tobie Poet  No LMP recorded (lmp unknown). Patient is postmenopausal.          Sexually active: No.  The current method of family planning is post menopausal status.    Exercising: Yes.    cardio, weights Smoker:  no  Health Maintenance: Pap:  Patient states was last done with Dr. Sabra Heck History of abnormal Pap:  yes MMG:  07/09/16 BIRADS 1 negative- in Care Everywhere Colonoscopy:  declines BMD:   remote TDaP:  07/08/08 Pneumonia vaccine(s):  Not done Zostavax:   unsure Hep C testing: vrecommended she have this done Screening Labs: PCP, Hb today: PCP, Urine today: normal    reports that she has never smoked. She has never used smokeless tobacco. She reports that she does not drink alcohol or use drugs.  Past Medical History:  Diagnosis Date  . Genital warts   . Hyperlipidemia   . Hypertension   . Myocardial infarction   . Peripheral arterial disease ([place])   . Status post angioplasty with  stent    Known femoral stent occlusion    Past Surgical History:  Procedure Laterality Date  . ABDOMINAL AORTAGRAM N/A 08/09/2011   Procedure: ABDOMINAL Maxcine Ham;  Surgeon: Elam Dutch, MD;  Location: [facility] CATH LAB;  Service: Cardiovascular;  Laterality: N/A;  . ANGIOPLASTY / STENTING FEMORAL  2008   Right femoral artery stenting done in [place]    Current Outpatient Prescriptions  Medication Sig Dispense Refill  . atorvastatin (LIPITOR) 40 MG tablet Take by mouth.    . clopidogrel (PLAVIX) 75 MG tablet Take by mouth.    . valsartan-hydrochlorothiazide (DIOVAN-HCT) 160-25 MG tablet Take by mouth.    Marland Kitchen aspirin EC 81 MG tablet Take by mouth.     No current facility-administered medications for this visit.     Family History  Problem Relation Age of Onset  . Uterine cancer Mother   . Diabetes Sister   . Hypertension Sister   . Breast cancer Maternal Aunt   . Stroke Maternal Uncle     ROS:  Pertinent items are noted in HPI.  Otherwise, a comprehensive ROS was negative.  Exam:   BP 106/68 (BP Location: Right Arm, Patient Position: Sitting, Cuff Size: Large)   Pulse 70   Resp 14   Ht 5' 5.5" (1.664 m)   [employer]  247 lb (112 kg) Comment: pt denied weight here- states this is weight from home  LMP  (LMP Unknown)   BMI 40.48 kg/m   Height: 5' 5.5" (166.4 cm)  Ht Readings from Last 3 Encounters:  08/12/16 5' 5.5" (1.664 m)  08/09/11 5' 5.5" (1.664 m)  08/01/11 5\' 5"  (1.651 m)   General appearance: alert, cooperative and appears stated age Head: Normocephalic, without obvious abnormality, atraumatic Neck: no adenopathy, supple, symmetrical, trachea midline and thyroid normal to inspection and palpation Lungs: clear to auscultation bilaterally Breasts: normal appearance, no masses or tenderness Heart: regular rate and rhythm Abdomen: soft, non-tender; bowel sounds normal; no masses,  no organomegaly Extremities: extremities normal, atraumatic, no cyanosis or edema Skin: Skin  color, texture, turgor normal. No rashes or lesions Lymph nodes: Cervical, supraclavicular, and axillary nodes normal. No abnormal inguinal nodes palpated Neurologic: Grossly normal   Pelvic: External genitalia:  no lesions              Urethra:  normal appearing urethra with no masses, tenderness or lesions              Bartholins and Skenes: normal                 Vagina: normal appearing vagina with normal color and discharge, no lesions              Cervix: no lesions              Pap taken: Yes.   Bimanual Exam:  Uterus:  normal size, contour, position, consistency, mobility, non-tender              Adnexa: normal adnexa and no mass, fullness, tenderness               Rectovaginal: Confirms               Anus:  normal sphincter tone, no lesions  Chaperone was present for exam.  A:  Well Woman with normal exam PMP, no HRT Morbid obesity, back at [facility] Weight Management program PVD, s/p right femoral bypass, on Plavix Vaginal odor c/w BV, not SA Lapse of gyn care  P:   Mammogram guidelines reviewed pap smear and HR HPV obtained Declines colonoscopy.  Will try and have pt do Cologuard this year Metrogel 0.75% nightly x 5 nights Recommended having shingles vaccine and Hep C testing bu tshe declines at this time. return annually or prn  Lengthy visit with pt, approximately 1 hour.  Pt and I discussed bariatric surgery, plastic surgery, prior weight loss attempts.

## 2016-08-12 NOTE — Patient Instructions (Signed)
[place]

## 2016-08-14 LAB — IPS PAP TEST WITH HPV

## 2016-08-27 ENCOUNTER — Telehealth: Payer: Self-pay

## 2016-08-27 NOTE — Telephone Encounter (Signed)
Spoke with patient regarding Cologaurd form. Advised patient form has been completed for submission. Advised she will need to contact her insurance company to discuss benefit coverage and cost. Patient is at the grocery store and would like to return call to discuss to obtain all information needed to contact her insurance company.   Cologuard CPT code [id number]  [employer] NPI [id number]  [employer] Tax ID [id number]

## 2016-09-03 NOTE — Telephone Encounter (Signed)
Spoke with patient. Advised patient will need to contact her insurance provider to verify her benefits for her Cologuard before proceeding. All information provided below for patient to call her insurance company. Patient will contact the office to let Dr.Miller know if she would like to proceed or not proceed with Cologuard testing.

## 2016-10-18 NOTE — Telephone Encounter (Signed)
Spoke with patient. Patient states that she has not had time to call her insurance company regarding Cologuard and is not sure if she will proceed. Patient will return call to the office if she decided to proceed with Cologuard testing.  Routing to provider for final review. Patient agreeable to disposition. Will close encounter.

## 2017-02-04 ENCOUNTER — Ambulatory Visit: Payer: Self-pay | Admitting: Dietician

## 2017-02-04 ENCOUNTER — Encounter: Payer: Self-pay | Admitting: Dietician

## 2017-11-13 ENCOUNTER — Ambulatory Visit: Payer: BLUE CROSS/BLUE SHIELD | Admitting: Obstetrics & Gynecology

## 2017-11-13 ENCOUNTER — Encounter

## 2017-11-18 ENCOUNTER — Ambulatory Visit: Payer: BLUE CROSS/BLUE SHIELD | Admitting: Obstetrics & Gynecology

## 2017-11-27 NOTE — Progress Notes (Signed)
62 y.o. Debbie [name] here for annual exam.  Has a job in [place].  Took job there and she is commuting home on the weekends.  Doing some new exercising.  Having some left hip pain and lower back pain.  Is frustrated with weight.  Feels commute and stressor of still having home in [place] have contributed.    Denies vaginal bleeding.    PCP:  Dr. Kym Groom. Saw him in October.  Had blood work then.  Patient's last menstrual period was 07/09/2007 (approximate).          Sexually active: No.  The current method of family planning is post menopausal status.    Exercising: Yes.    upper body only--first time in weeks. Smoker:  no  Health Maintenance: Pap: 08-12-16 Neg:Neg HR HPV History of abnormal Pap:  No. MMG: 07-09-16 Fatty/Neg/BiRads1:[facility]--in Epic Colonoscopy:  NEVER BMD:  Patient states years ago ordered by Dr.Milana Salay--normal TDaP:  07-08-08 Pneumonia vaccine(s):  declines Shingrix:   declines Hep C testing: declines Screening Labs: PCP/vascular   reports that she has never smoked. She has never used smokeless tobacco. She reports that she does not drink alcohol or use drugs.  Past Medical History:  Diagnosis Date  . Genital warts   . Hyperlipidemia   . Hypertension   . Myocardial infarction ([place])   . Peripheral arterial disease ([place])   . Status post angioplasty with stent    Known femoral stent occlusion    Past Surgical History:  Procedure Laterality Date  . ABDOMINAL AORTAGRAM N/A 08/09/2011   Procedure: ABDOMINAL Maxcine Ham;  Surgeon: Elam Dutch, MD;  [facility] C[facility];  Service: Cardiovascular;  Laterality: N/A;  . ANGIOPLASTY / STENTING FEMORAL  2008   Right femoral artery stenting done in [place]  . FEMORAL BYPASS Right 08/2011   Dr. Tobie Poet at [facility]    Current Outpatient Medications  Medication Sig Dispense Refill  . aspirin EC 81 MG tablet Take by mouth.    . clopidogrel (PLAVIX) 75 MG tablet Take 75 mg by mouth daily.    .  valsartan-hydrochlorothiazide (DIOVAN-HCT) 160-25 MG tablet Take by mouth.     No current facility-administered medications for this visit.     Family History  Problem Relation Age of Onset  . Uterine cancer Mother   . Diabetes Sister   . Hypertension Sister   . Breast cancer Maternal Aunt   . Stroke Maternal Uncle     Review of Systems  All other systems reviewed and are negative.   Exam:   BP 124/72 (BP Location: Right Arm, Patient Position: Sitting, Cuff Size: Large)   Pulse 88   Resp 20   Ht 5' 3.5" (1.613 m)   Wt 256 lb (116.1 kg)   LMP 07/09/2007 (Approximate)   BMI 44.64 kg/m   Height: +9#   Height: 5' 3.5" (161.3 cm)  Ht Readings from Last 3 Encounters:  11/28/17 5' 3.5" (1.613 m)  08/12/16 5' 5.5" (1.664 m)  08/09/11 5' 5.5" (1.664 m)    General appearance: alert, cooperative and appears stated age Head: Normocephalic, without obvious abnormality, atraumatic Neck: no adenopathy, supple, symmetrical, trachea midline and thyroid normal to inspection and palpation Lungs: clear to auscultation bilaterally Breasts: normal appearance, no masses or tenderness Heart: regular rate and rhythm Abdomen: soft, non-tender; bowel sounds normal; no masses,  no organomegaly Extremities: extremities normal, atraumatic, no cyanosis or edema Skin: Skin color, texture, turgor normal. No rashes or lesions Lymph nodes: Cervical, supraclavicular, and axillary  nodes normal. No abnormal inguinal nodes palpated Neurologic: Grossly normal   Pelvic: External genitalia:  no lesions              Urethra:  normal appearing urethra with no masses, tenderness or lesions              Bartholins and Skenes: normal                 Vagina: normal appearing vagina with normal color and discharge, no lesions              Cervix: no lesions              Pap taken: No. Bimanual Exam:  Uterus:  normal size, contour, position, consistency, mobility, non-tender              Adnexa: normal adnexa  and no mass, fullness, tenderness               Rectovaginal: Confirms               Anus:  normal sphincter tone, no lesions  Chaperone was present for exam.  A:  Well Woman with normal exam PMP, no HRT Morbid obesity PVD, s/p right femoral bypass, on Plavix  P:   Mammogram guidelines reviewed.  Aware this is due.  She wants [place] to schedule this for her. pap smear and HR HPV obtained 2018.  No pap needed today. IFOB given Reviewed Shingrix vaccination, declined today Lab work done 10/18 with Dr. Jefm Bryant although desires some today as well:  Vit D, Lipids, TSH, HbA1C, CMP, CBC, and B 12 obtained.   Return annually or prn

## 2017-11-28 ENCOUNTER — Other Ambulatory Visit: Payer: Self-pay

## 2017-11-28 ENCOUNTER — Encounter: Payer: Self-pay | Admitting: Obstetrics & Gynecology

## 2017-11-28 ENCOUNTER — Other Ambulatory Visit: Payer: Self-pay | Admitting: Obstetrics & Gynecology

## 2017-11-28 ENCOUNTER — Ambulatory Visit (INDEPENDENT_AMBULATORY_CARE_PROVIDER_SITE_OTHER): Payer: BLUE CROSS/BLUE SHIELD | Admitting: Obstetrics & Gynecology

## 2017-11-28 VITALS — BP 124/72 | HR 88 | Resp 20 | Ht 63.5 in | Wt 256.0 lb

## 2017-11-28 DIAGNOSIS — I10 Essential (primary) hypertension: Secondary | ICD-10-CM | POA: Insufficient documentation

## 2017-11-28 DIAGNOSIS — R7303 Prediabetes: Secondary | ICD-10-CM | POA: Diagnosis not present

## 2017-11-28 DIAGNOSIS — Z1231 Encounter for screening mammogram for malignant neoplasm of breast: Secondary | ICD-10-CM

## 2017-11-28 DIAGNOSIS — Z0000 Encounter for general adult medical examination without abnormal findings: Secondary | ICD-10-CM | POA: Diagnosis not present

## 2017-11-28 DIAGNOSIS — Z1211 Encounter for screening for malignant neoplasm of colon: Secondary | ICD-10-CM | POA: Diagnosis not present

## 2017-11-28 DIAGNOSIS — Z01419 Encounter for gynecological examination (general) (routine) without abnormal findings: Secondary | ICD-10-CM

## 2017-11-28 DIAGNOSIS — Z780 Asymptomatic menopausal state: Secondary | ICD-10-CM | POA: Diagnosis not present

## 2017-11-29 LAB — COMPREHENSIVE METABOLIC PANEL
ALT: 25 IU/L (ref 0–32)
AST: 27 IU/L (ref 0–40)
Albumin/Globulin Ratio: 1.7 (ref 1.2–2.2)
Albumin: 4.2 g/dL (ref 3.6–4.8)
Alkaline Phosphatase: 64 IU/L (ref 39–117)
BUN/Creatinine Ratio: 25 (ref 12–28)
BUN: 29 mg/dL — ABNORMAL HIGH (ref 8–27)
Bilirubin Total: <0.2 mg/dL (ref 0.0–1.2)
CO2: 25 mmol/L (ref 20–29)
Calcium: 10 mg/dL (ref 8.7–10.3)
Chloride: 105 mmol/L (ref 96–106)
Creatinine, Ser: 1.15 mg/dL — ABNORMAL HIGH (ref 0.57–1.00)
GFR calc Af Amer: 58 mL/min/{1.73_m2} — ABNORMAL LOW (ref >59)
GFR calc non Af Amer: 50 mL/min/{1.73_m2} — ABNORMAL LOW (ref >59)
Globulin, Total: 2.5 g/dL (ref 1.5–4.5)
Glucose: 110 mg/dL — ABNORMAL HIGH (ref 65–99)
Potassium: 3.9 mmol/L (ref 3.5–5.2)
Sodium: 146 mmol/L — ABNORMAL HIGH (ref 134–144)
Total Protein: 6.7 g/dL (ref 6.0–8.5)

## 2017-11-29 LAB — VITAMIN D 25 HYDROXY (VIT D DEFICIENCY, FRACTURES): Vit D, 25-Hydroxy: 24.3 ng/mL — ABNORMAL LOW (ref 30.0–100.0)

## 2017-11-29 LAB — CBC
Hematocrit: 40.9 % (ref 34.0–46.6)
Hemoglobin: 13.3 g/dL (ref 11.1–15.9)
MCH: 26.2 pg — ABNORMAL LOW (ref 26.6–33.0)
MCHC: 32.5 g/dL (ref 31.5–35.7)
MCV: 81 fL (ref 79–97)
Platelets: 237 10*3/uL (ref 150–450)
RBC: 5.07 x10E6/uL (ref 3.77–5.28)
RDW: 14.8 % (ref 12.3–15.4)
WBC: 6.5 10*3/uL (ref 3.4–10.8)

## 2017-11-29 LAB — LIPID PANEL
Chol/HDL Ratio: 4.4 ratio (ref 0.0–4.4)
Cholesterol, Total: 208 mg/dL — ABNORMAL HIGH (ref 100–199)
HDL: 47 mg/dL (ref >39)
LDL Calculated: 138 mg/dL — ABNORMAL HIGH (ref 0–99)
Triglycerides: 117 mg/dL (ref 0–149)
VLDL Cholesterol Cal: 23 mg/dL (ref 5–40)

## 2017-11-29 LAB — HEMOGLOBIN A1C
Est. average glucose Bld gHb Est-mCnc: 123 mg/dL
Hgb A1c MFr Bld: 5.9 % — ABNORMAL HIGH (ref 4.8–5.6)

## 2017-11-29 LAB — VITAMIN B12: Vitamin B-12: >2000 pg/mL — ABNORMAL HIGH (ref 232–1245)

## 2017-11-29 LAB — TSH: TSH: 1.53 u[IU]/mL (ref 0.450–4.500)

## 2017-12-10 LAB — FECAL OCCULT BLOOD, IMMUNOCHEMICAL: Fecal Occult Bld: NEGATIVE

## 2017-12-12 ENCOUNTER — Telehealth: Payer: Self-pay | Admitting: Obstetrics & Gynecology

## 2017-12-12 NOTE — Telephone Encounter (Signed)
Spoke with patient. Advised per result note from 11/28/2017 she should be taking Vitamin D 800 IU daily. Patient verbalizes understanding. Encounter closed.

## 2017-12-12 NOTE — Telephone Encounter (Signed)
Patient would like to know dose of Vitamin D she was recommended to take.

## 2018-01-09 ENCOUNTER — Other Ambulatory Visit: Payer: Self-pay

## 2018-01-09 ENCOUNTER — Ambulatory Visit: Payer: Self-pay

## 2018-02-24 ENCOUNTER — Ambulatory Visit
Admission: RE | Admit: 2018-02-24 | Discharge: 2018-02-24 | Disposition: A | Discharge status: Home / Self Care | Payer: BLUE CROSS/BLUE SHIELD | Source: Ambulatory Visit | Attending: Obstetrics & Gynecology | Admitting: Obstetrics & Gynecology

## 2018-02-24 ENCOUNTER — Telehealth: Payer: Self-pay | Admitting: Obstetrics & Gynecology

## 2018-02-24 DIAGNOSIS — Z780 Asymptomatic menopausal state: Secondary | ICD-10-CM

## 2018-02-24 DIAGNOSIS — Z1231 Encounter for screening mammogram for malignant neoplasm of breast: Secondary | ICD-10-CM

## 2018-02-24 NOTE — Telephone Encounter (Signed)
Spoke with patient. Confirmed orders in Epic for bilateral 3D screening MMG and BMD, no referral needed. Patient verbalizes understanding and is agreeable. Encounter closed.

## 2018-02-24 NOTE — Telephone Encounter (Signed)
Left message to call Debbie [name] at [phone].  

## 2018-02-24 NOTE — Telephone Encounter (Signed)
Patient is going to the breast center for 3d mammogram and want to make sure she does not need an order or referral.

## 2018-06-09 ENCOUNTER — Ambulatory Visit: Payer: BLUE CROSS/BLUE SHIELD | Admitting: Obstetrics & Gynecology

## 2018-06-09 ENCOUNTER — Encounter

## 2019-03-12 ENCOUNTER — Other Ambulatory Visit: Payer: Self-pay

## 2019-03-12 ENCOUNTER — Other Ambulatory Visit: Payer: Self-pay | Admitting: Obstetrics & Gynecology

## 2019-03-12 ENCOUNTER — Encounter: Payer: Self-pay | Admitting: *Deleted

## 2019-03-12 ENCOUNTER — Encounter: Payer: Self-pay | Admitting: Obstetrics & Gynecology

## 2019-03-12 ENCOUNTER — Ambulatory Visit (INDEPENDENT_AMBULATORY_CARE_PROVIDER_SITE_OTHER): Payer: BC Managed Care – PPO | Admitting: Obstetrics & Gynecology

## 2019-03-12 VITALS — BP 122/72 | HR 84 | Temp 97.1°F | Resp 14 | Ht 63.5 in | Wt 253.0 lb

## 2019-03-12 DIAGNOSIS — Z23 Encounter for immunization: Secondary | ICD-10-CM | POA: Diagnosis not present

## 2019-03-12 DIAGNOSIS — Z01419 Encounter for gynecological examination (general) (routine) without abnormal findings: Secondary | ICD-10-CM | POA: Diagnosis not present

## 2019-03-12 DIAGNOSIS — Z1231 Encounter for screening mammogram for malignant neoplasm of breast: Secondary | ICD-10-CM

## 2019-03-12 NOTE — Progress Notes (Signed)
63 y.o. [place] or [place] American female here for annual exam.  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  She was living in an apartment around [place].  She has been doing work remotely.    Denies vaginal bleeding.    Has been having hip issues.  Has seen ortho.  She did have an ultrasound with injection of steroid in the hip.    PCP:  Dr. Kym Groom.  Last appt was in August.  Had blood work.  Cholesterol was 191.  Is on crestor now.  HbA1C was 6.0.  Vit D was low.  She is on 4000 IU daily.  Patient's last menstrual period was 07/09/2007 (approximate).          Sexually active: No.  The current method of family planning is post menopausal status.    Exercising: Yes.    Walking and Physical Therapy  Smoker:  no  Health Maintenance: Pap:  08/12/16 Neg. HR HPV:neg  History of abnormal Pap:  no MMG:  02/24/18 BIRADS1:neg  Colonoscopy:  None.  Cologuard was discussed last year but her insurance did not cover it.   BMD:   02/24/18 Normal  TDaP:  Thinks she is Due  Pneumonia vaccine(s):  Declines Shingrix:   Declines Hep C testing: Decline  Screening Labs: PCP    reports that she has never smoked. She has never used smokeless tobacco. She reports that she does not drink alcohol or use drugs.  Past Medical History:  Diagnosis Date  . Genital warts   . Hyperlipidemia   . Hypertension   . Myocardial infarction ([place])   . Peripheral arterial disease ([place])   . Status post angioplasty with stent    Known femoral stent occlusion    Past Surgical History:  Procedure Laterality Date  . ABDOMINAL AORTAGRAM N/A 08/09/2011   Procedure: ABDOMINAL Maxcine Ham;  Surgeon: Elam Dutch, MD;  Location: [facility] CATH LAB;  Service: Cardiovascular;  Laterality: N/A;  . ANGIOPLASTY / STENTING FEMORAL  2008   Right femoral artery stenting done in [place]  . FEMORAL BYPASS Right 08/2011   [facility] Tobie Poet at [facility]    Current Outpatient Medications  Medication Sig Dispense Refill  . aspirin EC 81 MG tablet Take by  mouth.    . clopidogrel (PLAVIX) 75 MG tablet Take 75 mg by mouth daily.    . valsartan-hydrochlorothiazide (DIOVAN-HCT) 160-25 MG tablet Take by mouth.     No current facility-administered medications for this visit.     Family History  Problem Relation Age of Onset  . Uterine cancer Mother   . Diabetes Sister   . Hypertension Sister   . Breast cancer Maternal Aunt   . Stroke Maternal Uncle     Review of Systems  All other systems reviewed and are negative.   Exam:   BP 122/72   Pulse 84   Temp (!) 97.1 F (36.2 C)   Resp 14   Ht 5' 3.5" (1.613 m)   Wt 253 lb (114.8 kg) Comment: Patient weighed this morning at home refused at office.  LMP 07/09/2007 (Approximate)   BMI 44.11 kg/m   Height:   Height: 5' 3.5" (161.3 cm)  Ht Readings from Last 3 Encounters:  03/12/19 5' 3.5" (1.613 m)  11/28/17 5' 3.5" (1.613 m)  08/12/16 5' 5.5" (1.664 m)    General appearance: alert, cooperative and appears stated age Head: Normocephalic, without obvious abnormality, atraumatic Neck: no adenopathy, supple, symmetrical, trachea midline and thyroid normal to inspection and  palpation Lungs: clear to auscultation bilaterally Breasts: normal appearance, no masses or tenderness Heart: regular rate and rhythm Abdomen: soft, non-tender; bowel sounds normal; no masses,  no organomegaly Extremities: extremities normal, atraumatic, no cyanosis or edema Skin: Skin color, texture, turgor normal. No rashes or lesions Lymph nodes: Cervical, supraclavicular, and axillary nodes normal. No abnormal inguinal nodes palpated Neurologic: Grossly normal   Pelvic: External genitalia:  no lesions              Urethra:  normal appearing urethra with no masses, tenderness or lesions              Bartholins and Skenes: normal                 Vagina: normal appearing vagina with normal color and discharge, no lesions              Cervix: no lesions              Pap taken: Yes.   Bimanual Exam:  Uterus:   normal size, contour, position, consistency, mobility, non-tender              Adnexa: normal adnexa and no mass, fullness, tenderness               Rectovaginal: Confirms               Anus:  normal sphincter tone, no lesions  Chaperone was present for exam.  A:  Well Woman with normal exam PMP, no HRT Morbid obesity PVD, s/p right femoral bypass, on Plavix  P:   Mammogram guidelines reviewed.   pap smear with neg HR HPV 2018.  Not indicated today Tdap updated today Lab work done in August Declines pneumonia and shingrix vaccination Cologuard order placed today Return annually or prn

## 2019-04-26 ENCOUNTER — Other Ambulatory Visit: Payer: Self-pay

## 2019-04-26 ENCOUNTER — Ambulatory Visit
Admission: RE | Admit: 2019-04-26 | Discharge: 2019-04-26 | Disposition: A | Discharge status: Home / Self Care | Payer: BC Managed Care – PPO | Source: Ambulatory Visit | Attending: Obstetrics & Gynecology | Admitting: Obstetrics & Gynecology

## 2019-04-26 DIAGNOSIS — Z1231 Encounter for screening mammogram for malignant neoplasm of breast: Secondary | ICD-10-CM

## 2019-10-13 ENCOUNTER — Telehealth: Payer: Self-pay

## 2019-10-13 DIAGNOSIS — Z8049 Family history of malignant neoplasm of other genital organs: Secondary | ICD-10-CM

## 2019-10-13 NOTE — Telephone Encounter (Signed)
Patient called and stated Dr. Sabra Heck wanted her to get a colon test. Patient needs reference number to give to insurance company to see if it is covered.

## 2019-10-13 NOTE — Telephone Encounter (Signed)
Left message to call Sharee Pimple, RN at [facility].    Per review of AEX 03/12/19, cologuard was ordered.   Dx: Z12.11 & Z12.12 Encounter for screening for malignant neoplasm of colon and rectum

## 2019-10-13 NOTE — Telephone Encounter (Signed)
Spoke with patient.   1. Patient requesting Dx codes for Cologuard. MyChart message sent.   2. Patient reports family Hx of uterine cancer in mother, patient states she has discussed PUS with Dr. Sabra Heck, would like to proceed. Last AEX 03/12/19. PUS scheduled for 10/28/19 at 2:30pm, consult to follow with Dr. Sabra Heck.   Advised I will review with Dr. Sabra Heck and return call if any additional recommendations. Advised our business office will precert PUS and call prior to appt to review. Patient verbalizes understanding and is agreeable.   Order pended for PUS.   Dr. Sabra Heck -ok to proceed with PUS as scheduled?

## 2019-10-15 NOTE — Telephone Encounter (Signed)
Order placed for PUS. Message to business office.   Encounter closed.

## 2019-10-15 NOTE — Telephone Encounter (Signed)
Yes, PUS is fine.  Thanks.

## 2019-10-28 ENCOUNTER — Telehealth: Payer: Self-pay | Admitting: Obstetrics & Gynecology

## 2019-10-28 ENCOUNTER — Other Ambulatory Visit: Payer: Self-pay | Admitting: Obstetrics & Gynecology

## 2019-10-28 ENCOUNTER — Other Ambulatory Visit: Payer: BC Managed Care – PPO

## 2019-10-28 ENCOUNTER — Other Ambulatory Visit: Payer: Self-pay | Admitting: Family Medicine

## 2019-10-28 DIAGNOSIS — Z780 Asymptomatic menopausal state: Secondary | ICD-10-CM

## 2019-10-28 DIAGNOSIS — Z1231 Encounter for screening mammogram for malignant neoplasm of breast: Secondary | ICD-10-CM

## 2019-10-28 NOTE — Telephone Encounter (Signed)
Patient canceled her PUS appointment today. She woke up with a fever and request to reschedule. To triage to reschedule.

## 2019-10-28 NOTE — Telephone Encounter (Signed)
Spoke with patient. Patient request to r/s PUS for family Hx of uterine cancer in mother. PUS scheduled for 12/02/19 at 12:30pm with consult to follow at 1pm with Dr. Sabra Heck. Patient declined earlier appt, will be traveling.   Order previously placed for PUS.   Routing to provider for final review. Patient is agreeable to disposition. Will close encounter.  Cc: Wilson Singer, Magdalene Patricia

## 2019-12-01 ENCOUNTER — Other Ambulatory Visit: Payer: Self-pay

## 2019-12-02 ENCOUNTER — Encounter: Payer: Self-pay | Admitting: Obstetrics & Gynecology

## 2019-12-02 ENCOUNTER — Ambulatory Visit (INDEPENDENT_AMBULATORY_CARE_PROVIDER_SITE_OTHER): Payer: BC Managed Care – PPO

## 2019-12-02 ENCOUNTER — Ambulatory Visit (INDEPENDENT_AMBULATORY_CARE_PROVIDER_SITE_OTHER): Payer: BC Managed Care – PPO | Admitting: Obstetrics & Gynecology

## 2019-12-02 VITALS — BP 122/80 | Temp 97.2°F | Wt 265.4 lb

## 2019-12-02 DIAGNOSIS — Z8049 Family history of malignant neoplasm of other genital organs: Secondary | ICD-10-CM | POA: Diagnosis not present

## 2019-12-02 NOTE — Progress Notes (Signed)
64 y.o. [place] or [place] American female here for pelvic ultrasound due to family hx of uterine cancer in her mother.  Has not had vaginal bleeding in >10 years.  Is aware this is the most important symptom to watch for and to call about for evaluation.  We discussed this again today.  Pt requested ultrasound.  We have discussed there are no recommended screening tests for this.  Pt just came by from [place], [place] due to interviewing for position at [employer] in [place], [place].    Patient's last menstrual period was 07/09/2007 (approximate).  Contraception: PMP  Findings:  UTERUS: 6.8 x 4.5 x 4.1cm, calcifications with shadowing present, possible fibroids with calcifications vs uterine calcifications EMS: 3.17mm with  Scant free fluid within endometrial cavity measuring 9 x 5 x 2mm ADNEXA: Left ovary: 1.4 x 1.0 x 0.9cm       Right ovary: 1.9 x 1.0 x 1.0cm CUL DE SAC: no free fluid noted  Discussion:  Ultrasonographer supervised.  Images reviewed.  Pt reassured about findings.  No additional evaluation needed for incidental finding of endometrial fluid.  Possible history of prior fibroids discussed.  She is aware if ever has VB, should be seen for endometrial biopsy.  Questions answered.  Assessment:  Family hx of ovarian cancer Uterine calcifications Sliver of endometrial fluid  Plan:  Will return for AEX in November which is already scheduled.  Pt advised to call and let me know if gets jobs in [facility].  Depending on when this is to start, may try to move up appt.  20 minutes spent in face to face discussion with pt regarding ultrasound findings, screening for endometrial cancer, signs/symptoms of endometrial cancer.

## 2020-04-07 ENCOUNTER — Telehealth: Payer: Self-pay

## 2020-04-07 NOTE — Telephone Encounter (Signed)
Left message on voicemail to call and reschedule cancelled appointment. °

## 2020-05-15 ENCOUNTER — Ambulatory Visit: Payer: BC Managed Care – PPO | Admitting: Obstetrics & Gynecology

## 2020-05-19 ENCOUNTER — Ambulatory Visit: Payer: BC Managed Care – PPO | Admitting: Obstetrics & Gynecology

## 2020-06-19 ENCOUNTER — Other Ambulatory Visit (HOSPITAL_COMMUNITY)
Admission: RE | Admit: 2020-06-19 | Discharge: 2020-06-19 | Disposition: A | Discharge status: Home / Self Care | Payer: Medicare Other | Source: Ambulatory Visit | Attending: Obstetrics & Gynecology | Admitting: Obstetrics & Gynecology

## 2020-06-19 ENCOUNTER — Encounter: Payer: Self-pay | Admitting: Obstetrics & Gynecology

## 2020-06-19 ENCOUNTER — Ambulatory Visit (INDEPENDENT_AMBULATORY_CARE_PROVIDER_SITE_OTHER): Payer: Medicare Other | Admitting: Obstetrics & Gynecology

## 2020-06-19 VITALS — BP 135/82 | HR 60 | Ht 65.5 in | Wt 244.0 lb

## 2020-06-19 DIAGNOSIS — Z124 Encounter for screening for malignant neoplasm of cervix: Secondary | ICD-10-CM | POA: Insufficient documentation

## 2020-06-19 DIAGNOSIS — Z95820 Peripheral vascular angioplasty status with implants and grafts: Secondary | ICD-10-CM

## 2020-06-19 DIAGNOSIS — I10 Essential (primary) hypertension: Secondary | ICD-10-CM

## 2020-06-19 DIAGNOSIS — Z1231 Encounter for screening mammogram for malignant neoplasm of breast: Secondary | ICD-10-CM | POA: Diagnosis not present

## 2020-06-19 DIAGNOSIS — Z780 Asymptomatic menopausal state: Secondary | ICD-10-CM | POA: Diagnosis not present

## 2020-06-19 DIAGNOSIS — Z8049 Family history of malignant neoplasm of other genital organs: Secondary | ICD-10-CM

## 2020-06-19 DIAGNOSIS — I739 Peripheral vascular disease, unspecified: Secondary | ICD-10-CM

## 2020-06-19 DIAGNOSIS — E7800 Pure hypercholesterolemia, unspecified: Secondary | ICD-10-CM

## 2020-06-19 NOTE — Progress Notes (Signed)
64 y.o. G1P1 Single Black or [place] American female here for breast and pelvic exam.  Job in [place] finished in August.  Didn't get job in [place], [place].  Decided to retire but is still considering options.  Last HbA1C was 6.2.  Followed by DR. Olmeda.  Denies vaginal bleeding.  Patient's last menstrual period was 07/09/2007 (approximate).          Sexually active: No.  The current method of family planning is post menopausal status.    Exercising: Yes.    gyn regularly Smoker:  no  Health Maintenance: Pap:  Neg with neg HR HPV 08/12/2016 History of abnormal Pap:  no MMG:  04/2019 Colonoscopy:  Did mail in test for Dr. Jefm Bryant BMD:   Normal 02/2019 Vaccines and screening Labs: Dr. Jefm Bryant   reports that she has never smoked. She has never used smokeless tobacco. She reports that she does not drink alcohol and does not use drugs.  Past Medical History:  Diagnosis Date  . Genital warts   . Hyperlipidemia   . Hypertension   . Myocardial infarction ([place])   . Peripheral arterial disease ([place])   . Status post angioplasty with stent    Known femoral stent occlusion    Past Surgical History:  Procedure Laterality Date  . ABDOMINAL AORTAGRAM N/A 08/09/2011   Procedure: ABDOMINAL Maxcine Ham;  Surgeon: Elam Dutch, MD;  Location: [facility] CATH LAB;  Service: Cardiovascular;  Laterality: N/A;  . ANGIOPLASTY / STENTING FEMORAL  2008   Right femoral artery stenting done in [place]  . FEMORAL BYPASS Right 08/2011   Dr. Tobie Poet at [facility]    Current Outpatient Medications  Medication Sig Dispense Refill  . aspirin EC 81 MG tablet Take by mouth.    . clopidogrel (PLAVIX) 75 MG tablet Take 75 mg by mouth daily.    . rosuvastatin (CRESTOR) 20 MG tablet Take 20 mg by mouth at bedtime.    . valsartan-hydrochlorothiazide (DIOVAN-HCT) 160-25 MG tablet Take by mouth.     No current facility-administered medications for this visit.    Family History  Problem Relation Age of Onset  . Uterine cancer  Mother   . Diabetes Sister   . Hypertension Sister   . Breast cancer Maternal Aunt   . Stroke Maternal Uncle     Review of Systems  Exam:   BP 135/82   Pulse 60   Ht 5' 5.5" (1.664 m)   Wt 244 lb (110.7 kg)   LMP 07/09/2007 (Approximate)   BMI 39.99 kg/m   Height: 5' 5.5" (166.4 cm)  General appearance: alert, cooperative and appears stated age Head: Normocephalic, without obvious abnormality, atraumatic Breasts: normal appearance, no masses or tenderness Abdomen: soft, non-tender; bowel sounds normal; no masses,  no organomegaly Extremities: extremities normal, atraumatic, no cyanosis or edema Skin: Skin color, texture, turgor normal. No rashes or lesions Lymph nodes: Cervical, supraclavicular, and axillary nodes normal. No abnormal inguinal nodes palpated Neurologic: Grossly normal   Pelvic: External genitalia:  no lesions              Urethra:  normal appearing urethra with no masses, tenderness or lesions              Bartholins and Skenes: normal                 Vagina: normal appearing vagina with normal color and discharge, no lesions              Cervix: no lesions  Pap taken: Yes.   Bimanual Exam:  Uterus:  normal size, contour, position, consistency, mobility, non-tender              Adnexa: no mass, fullness, tenderness               Rectovaginal: Confirms               Anus:  normal sphincter tone, no lesions  Chaperone CMA, was present for exam.  Assessment/Plan: 1. Family history of uterine cancer - Shared decision making about pap smears discussed today.  She desires to continue.  Pt may desire to be seen yearly. - Pt knows to call with any vaginal bleeding  2. Postmenopausal - No HRT  3. Primary hypertension/Pure hypercholesterolemia/PAD (peripheral artery disease) (HCC) - Followed by Dr. Jefm Bryant  4. Encounter for screening mammogram for malignant neoplasm of breast - MM 3D SCREEN BREAST BILATERAL; Future   32 minutes of total time  was spent for this patient encounter, including preparation, face-to-face counseling with the patient and coordination of care, and documentation of the encounter.

## 2020-06-19 NOTE — Patient Instructions (Addendum)
Dr. Delano [facility] [address] [place], [place] [address] [phone]  Dr. Crissie Reese Address: [address], [place], [place] [address] Phone: [phone]  Make sure to get your pneumonia vaccine with Dr. Jefm Bryant at next visit

## 2020-06-20 LAB — CYTOLOGY - PAP: Diagnosis: NEGATIVE

## 2020-07-28 ENCOUNTER — Ambulatory Visit: Payer: Medicare Other

## 2020-08-08 ENCOUNTER — Institutional Professional Consult (permissible substitution): Payer: Medicare Other | Admitting: Plastic Surgery

## 2020-09-14 ENCOUNTER — Ambulatory Visit
Admission: RE | Admit: 2020-09-14 | Discharge: 2020-09-14 | Disposition: A | Discharge status: Home / Self Care | Payer: Medicare Other | Source: Ambulatory Visit | Attending: Obstetrics & Gynecology | Admitting: Obstetrics & Gynecology

## 2020-09-14 ENCOUNTER — Other Ambulatory Visit: Payer: Self-pay

## 2020-09-14 DIAGNOSIS — Z1231 Encounter for screening mammogram for malignant neoplasm of breast: Secondary | ICD-10-CM

## 2020-09-15 ENCOUNTER — Ambulatory Visit
Admission: RE | Admit: 2020-09-15 | Discharge: 2020-09-15 | Disposition: A | Discharge status: Home / Self Care | Payer: Medicare Other | Source: Ambulatory Visit | Attending: Family Medicine | Admitting: Family Medicine

## 2020-09-15 ENCOUNTER — Other Ambulatory Visit: Payer: Self-pay | Admitting: Family Medicine

## 2020-09-15 DIAGNOSIS — R1011 Right upper quadrant pain: Secondary | ICD-10-CM | POA: Diagnosis present

## 2020-11-08 ENCOUNTER — Ambulatory Visit: Payer: Medicare Other

## 2021-04-21 LAB — COLOGUARD: COLOGUARD: POSITIVE — AB

## 2021-04-24 ENCOUNTER — Telehealth: Payer: Self-pay

## 2021-04-24 NOTE — Telephone Encounter (Signed)
Pt. Calling about scheduling colonoscopy. Pt says her Dr. Durene Cal the referral for her to have a procedure only.

## 2021-04-25 ENCOUNTER — Other Ambulatory Visit: Payer: Self-pay

## 2021-04-25 ENCOUNTER — Other Ambulatory Visit: Payer: Self-pay | Admitting: Gastroenterology

## 2021-04-25 DIAGNOSIS — Z1211 Encounter for screening for malignant neoplasm of colon: Secondary | ICD-10-CM

## 2021-04-25 DIAGNOSIS — R195 Other fecal abnormalities: Secondary | ICD-10-CM

## 2021-04-25 MED ORDER — CLENPIQ 10-3.5-12 MG-GM -GM/160ML PO SOLN: 1.0000 | Freq: Once | ORAL | 0 refills | Status: DC

## 2021-04-25 NOTE — Progress Notes (Signed)
Gastroenterology Pre-Procedure Review  Request Date: 05/15/21 Requesting Physician: Dr. Vicente Males  PATIENT REVIEW QUESTIONS: The patient responded to the following health history questions as indicated:    1. Are you having any GI issues? no 2. Do you have a personal history of Polyps? no 3. Do you have a family history of Colon Cancer or Polyps? no 4. Diabetes Mellitus? no 5. Joint replacements in the past 12 months?no 6. Major health problems in the past 3 months?no 7. Any artificial heart valves, MVP, or defibrillator?no    MEDICATIONS & ALLERGIES:    Patient reports the following regarding taking any anticoagulation/antiplatelet therapy:   Plavix, Coumadin, Eliquis, Xarelto, Lovenox, Pradaxa, Brilinta, or Effient? Yes, Plavix 75 mg Aspirin? yes (81 mg)  Patient confirms/reports the following medications:  Current Outpatient Medications  Medication Sig Dispense Refill   aspirin EC 81 MG tablet Take by mouth.     clopidogrel (PLAVIX) 75 MG tablet Take 75 mg by mouth daily.     rosuvastatin (CRESTOR) 20 MG tablet Take 20 mg by mouth at bedtime.     valsartan-hydrochlorothiazide (DIOVAN-HCT) 160-25 MG tablet Take by mouth.     No current facility-administered medications for this visit.    Patient confirms/reports the following allergies:  Allergies  Allergen Reactions   Sulfa Antibiotics     No orders of the defined types were placed in this encounter.   AUTHORIZATION INFORMATION Primary Insurance: 1D#: Group #:  Secondary Insurance: 1D#: Group #:  SCHEDULE INFORMATION: Date: 05/15/21 Time: Location: [place]

## 2021-04-25 NOTE — Telephone Encounter (Signed)
Returned patients call. LVM to call back °

## 2021-04-26 ENCOUNTER — Telehealth: Payer: Self-pay

## 2021-04-26 NOTE — Telephone Encounter (Signed)
Pt. Calling to reschedule colonoscopy

## 2021-04-27 NOTE — Telephone Encounter (Signed)
Spoke with patient and she would like to keep her original procedure date now. She was able to find someone to bring her.

## 2021-05-01 ENCOUNTER — Other Ambulatory Visit: Payer: Self-pay

## 2021-05-01 ENCOUNTER — Telehealth: Payer: Self-pay

## 2021-05-01 MED ORDER — NA SULFATE-K SULFATE-MG SULF 17.5-3.13-1.6 GM/177ML PO SOLN: 354.0000 mL | Freq: Once | ORAL | 0 refills | Status: AC

## 2021-05-01 NOTE — Telephone Encounter (Signed)
Patient has been informed of blood thinner clearance. Per Dr. Ezzard Flax patient can stop Plavix (5) days prior to procedure and restart (1) day after. Procedure has been rescheduled to 05/24/21. Endo unit has been notified.

## 2021-05-04 ENCOUNTER — Other Ambulatory Visit: Payer: Self-pay

## 2021-05-04 MED ORDER — NA SULFATE-K SULFATE-MG SULF 17.5-3.13-1.6 GM/177ML PO SOLN: 354.0000 mL | Freq: Once | ORAL | 0 refills | Status: AC

## 2021-06-08 ENCOUNTER — Encounter: Payer: Self-pay | Admitting: Gastroenterology

## 2021-06-11 ENCOUNTER — Ambulatory Visit
Admission: RE | Admit: 2021-06-11 | Discharge: 2021-06-11 | Disposition: A | Discharge status: Home / Self Care | Payer: Medicare Other | Attending: Gastroenterology | Admitting: Gastroenterology

## 2021-06-11 ENCOUNTER — Ambulatory Visit: Payer: Medicare Other | Admitting: Certified Registered"

## 2021-06-11 ENCOUNTER — Encounter
Admission: RE | Disposition: A | Discharge status: Home / Self Care | Payer: Self-pay | Source: Home / Self Care | Attending: Gastroenterology

## 2021-06-11 DIAGNOSIS — D122 Benign neoplasm of ascending colon: Secondary | ICD-10-CM | POA: Insufficient documentation

## 2021-06-11 DIAGNOSIS — R195 Other fecal abnormalities: Secondary | ICD-10-CM | POA: Insufficient documentation

## 2021-06-11 DIAGNOSIS — D125 Benign neoplasm of sigmoid colon: Secondary | ICD-10-CM | POA: Insufficient documentation

## 2021-06-11 DIAGNOSIS — D126 Benign neoplasm of colon, unspecified: Secondary | ICD-10-CM | POA: Diagnosis not present

## 2021-06-11 DIAGNOSIS — Z1211 Encounter for screening for malignant neoplasm of colon: Secondary | ICD-10-CM

## 2021-06-11 HISTORY — PX: COLONOSCOPY WITH PROPOFOL: SHX5780

## 2021-06-11 SURGERY — COLONOSCOPY WITH PROPOFOL
Anesthesia: General

## 2021-06-11 MED ORDER — SODIUM CHLORIDE 0.9 % IV SOLN
INTRAVENOUS | Status: DC
  Administered 2021-06-11: 20 mL/h via INTRAVENOUS

## 2021-06-11 MED ORDER — LIDOCAINE HCL (PF) 2 % IJ SOLN
INTRAMUSCULAR | Status: AC
  Filled 2021-06-11: qty 5

## 2021-06-11 MED ORDER — PROPOFOL 500 MG/50ML IV EMUL
INTRAVENOUS | Status: AC
  Filled 2021-06-11: qty 100

## 2021-06-11 MED ORDER — LIDOCAINE 2% (20 MG/ML) 5 ML SYRINGE
INTRAMUSCULAR | Status: DC | PRN
  Administered 2021-06-11: 20 mg via INTRAVENOUS

## 2021-06-11 MED ORDER — PROPOFOL 10 MG/ML IV BOLUS
INTRAVENOUS | Status: DC | PRN
  Administered 2021-06-11: 100 mg via INTRAVENOUS

## 2021-06-11 MED ORDER — PROPOFOL 500 MG/50ML IV EMUL
INTRAVENOUS | Status: AC
  Filled 2021-06-11: qty 50

## 2021-06-11 MED ORDER — PROPOFOL 500 MG/50ML IV EMUL
INTRAVENOUS | Status: DC | PRN
  Administered 2021-06-11: 120 ug/kg/min via INTRAVENOUS

## 2021-06-11 NOTE — H&P (Signed)
Debbie Bellows, MD [address] [place], [place], [place], [address] [address], [place], [place], [place], [address] Phone: [phone]  Fax: [phone]  Primary Care Physician:  Valera Castle, MD   Pre-Procedure History & Physical: HPI:  Debbie [name] is a 65 y.o. female is here for an colonoscopy.   Past Medical History:  Diagnosis Date   Genital warts    Hyperlipidemia    Hypertension    Myocardial infarction [facility])    Peripheral arterial disease ([place])    Status post angioplasty with stent    Known femoral stent occlusion    Past Surgical History:  Procedure Laterality Date   ABDOMINAL AORTAGRAM N/A 08/09/2011   Procedure: ABDOMINAL Maxcine Ham;  Surgeon: Elam Dutch, MD;  Location: [facility] CATH LAB;  Service: Cardiovascular;  Laterality: N/A;   ANGIOPLASTY / STENTING FEMORAL  2008   Right femoral artery stenting done in [place] Right 08/2011   Dr. Tobie Poet at [facility]    Prior to Admission medications   Medication Sig Start Date End Date Taking? Authorizing Provider  aspirin EC 81 MG tablet Take by mouth.   Yes [provider]  clopidogrel (PLAVIX) 75 MG tablet Take 75 mg by mouth daily.   Yes [provider]  polyethylene glycol-electrolytes (NULYTELY) 420 g solution Prepare according to package instructions. Starting at 5:00 PM: Drink one 8 oz glass of mixture every 15 minutes until you finish half of the jug. Five hours prior to procedure, drink 8 oz glass of mixture every 15 minutes until it is all gone. Make sure you do not drink anything 4 hours prior to your procedure. 04/25/21  Yes Debbie Bellows, MD  rosuvastatin (CRESTOR) 20 MG tablet Take 20 mg by mouth at bedtime. 11/05/19  Yes [provider]  valsartan-hydrochlorothiazide (DIOVAN-HCT) 160-25 MG tablet Take by mouth. 07/09/16  Yes [provider]    Allergies as of 04/25/2021 - Review Complete 06/19/2020  Allergen Reaction Noted   Sulfa antibiotics   08/01/2011    Family History  Problem Relation Age of Onset   Uterine cancer Mother    Diabetes Sister    Hypertension Sister    Breast cancer Maternal Aunt    Stroke Maternal Uncle     Social History   Socioeconomic History   Marital status: Single    Spouse name: Not on file   Number of children: Not on file   Years of education: Not on file   Highest education level: Not on file  Occupational History   Not on file  Tobacco Use   Smoking status: Never   Smokeless tobacco: Never  Vaping Use   Vaping Use: Never used  Substance and Sexual Activity   Alcohol use: No   Drug use: No   Sexual activity: Not Currently    Birth control/protection: Post-menopausal  Other Topics Concern   Not on file  Social History Narrative   Not on file   Social Determinants of Health   Financial Resource Strain: Not on file  Food Insecurity: Not on file  Transportation Needs: Not on file  Physical Activity: Not on file  Stress: Not on file  Social Connections: Not on file  Intimate Partner Violence: Not on file    Review of Systems: See HPI, otherwise negative ROS  Physical Exam: BP (!) 194/88   Temp (!) 96.2 F (35.7 C) (Temporal)   Resp 20   Ht 5' 5.5" (1.664 m)  Wt 114.8 kg   LMP 07/09/2007 (Approximate)   SpO2 97%   BMI 41.46 kg/m  General:   Alert,  pleasant and cooperative in NAD Head:  Normocephalic and atraumatic. Neck:  Supple; no masses or thyromegaly. Lungs:  Clear throughout to auscultation, normal respiratory effort.    Heart:  +S1, +S2, Regular rate and rhythm, No edema. Abdomen:  Soft, nontender and nondistended. Normal bowel sounds, without guarding, and without rebound.   Neurologic:  Alert and  oriented x4;  grossly normal neurologically.  Impression/Plan: Debbie [name] is here for an colonoscopy to be performed for positive cologuard Risks, benefits, limitations, and alternatives regarding  colonoscopy have been reviewed with the patient.   Questions have been answered.  All parties agreeable.   Debbie Bellows, MD  06/11/2021, 8:00 AM

## 2021-06-11 NOTE — Anesthesia Preprocedure Evaluation (Signed)
Anesthesia Evaluation  Patient identified by MRN, date of birth, ID band Patient awake    Reviewed: Allergy & Precautions, H&P , NPO status , Patient's Chart, lab work & pertinent test results, reviewed documented beta blocker date and time   Airway Mallampati: II   Neck ROM: full    Dental  (+) Poor Dentition   Pulmonary neg pulmonary ROS,    Pulmonary exam normal        Cardiovascular Exercise Tolerance: Poor hypertension, On Medications + Past MI and + Peripheral Vascular Disease  Normal cardiovascular exam Rhythm:regular Rate:Normal     Neuro/Psych negative neurological ROS  negative psych ROS   GI/Hepatic negative GI ROS, Neg liver ROS,   Endo/Other  Morbid obesity  Renal/GU negative Renal ROS  negative genitourinary   Musculoskeletal   Abdominal   Peds  Hematology negative hematology ROS (+)   Anesthesia Other Findings Past Medical History: No date: Genital warts No date: Hyperlipidemia No date: Hypertension No date: Myocardial infarction ([place]) No date: Peripheral arterial disease (HCC) No date: Status post angioplasty with stent     Comment:  Known femoral stent occlusion Past Surgical History: 08/09/2011: ABDOMINAL AORTAGRAM; N/A     Comment:  Procedure: ABDOMINAL AORTAGRAM;  Surgeon: Elam Dutch, MD;  Location: [facility] CATH LAB;  Service:               Cardiovascular;  Laterality: N/A; 2008: ANGIOPLASTY / STENTING FEMORAL     Comment:  Right femoral artery stenting done in [place] 08/2011: FEMORAL BYPASS; Right     Comment:  Dr. Tobie Poet at [facility]    Body Mass Index: 41.46 kg/m     Reproductive/Obstetrics negative OB ROS                             Anesthesia Physical Anesthesia Plan  ASA: 3  Anesthesia Plan: General   Post-op Pain Management:    Induction:   PONV Risk Score and Plan:   Airway Management Planned:   Additional Equipment:    Intra-op Plan:   Post-operative Plan:   Informed Consent: I have reviewed the patients History and Physical, chart, labs and discussed the procedure including the risks, benefits and alternatives for the proposed anesthesia with the patient or authorized representative who has indicated his/her understanding and acceptance.     Dental Advisory Given  Plan Discussed with: CRNA  Anesthesia Plan Comments:         Anesthesia Quick Evaluation

## 2021-06-11 NOTE — Transfer of Care (Signed)
Immediate Anesthesia Transfer of Care Note  Patient: Debbie [name]  Procedure(s) Performed: COLONOSCOPY WITH PROPOFOL  Patient Location: Endoscopy Unit  Anesthesia Type:General  Level of Consciousness: drowsy  Airway & Oxygen Therapy: Patient Spontanous Breathing  Post-op Assessment: Report given to RN and Post -op Vital signs reviewed and stable  Post vital signs: Reviewed  Last Vitals:  Vitals Value Taken Time  BP    Temp    Pulse 55 06/11/21 0839  Resp 17 06/11/21 0839  SpO2 100 % 06/11/21 0839  Vitals shown include unvalidated device data.  Last Pain:  Vitals:   06/11/21 0723  TempSrc: Temporal  PainSc: 0-No pain         Complications: No notable events documented.

## 2021-06-11 NOTE — Op Note (Signed)
[facility] Gastroenterology Patient Name: Debbie [name] Procedure Date: 06/11/2021 7:16 AM MRN: [id number] Account #: [id number] Date of Birth: 08-19-1955 Admit Type: Outpatient Age: 65 Room: [facility] ENDO ROOM 4 Gender: Female Note Status: Finalized Instrument Name: Jasper Riling [id number] Procedure:             Colonoscopy Indications:           Positive Cologuard test Providers:             [name] MD, MD Referring MD:          No Local Md, MD (Referring MD) Medicines:             Monitored Anesthesia Care Complications:         No immediate complications. Procedure:             Pre-Anesthesia Assessment:                        - Prior to the procedure, a History and Physical was                         performed, and patient medications, allergies and                         sensitivities were reviewed. The patient's tolerance                         of previous anesthesia was reviewed.                        - The risks and benefits of the procedure and the                         sedation options and risks were discussed with the                         patient. All questions were answered and informed                         consent was obtained.                        - ASA Grade Assessment: II - A patient with mild                         systemic disease.                        After obtaining informed consent, the colonoscope was                         passed under direct vision. Throughout the procedure,                         the patient's blood pressure, pulse, and oxygen                         saturations were monitored continuously. The                         Colonoscope was introduced through  the anus and                         advanced to the the cecum, identified by the                         appendiceal orifice. The colonoscopy was performed                         with ease. The patient tolerated the procedure well.                          The quality of the bowel preparation was fair. Findings:      The perianal and digital rectal examinations were normal.      Three sessile polyps were found in the ascending colon. The polyps were       4 to 6 mm in size. These polyps were removed with a cold snare.       Resection and retrieval were complete.      A 15 mm polyp was found in the sigmoid colon. The polyp was       semi-sessile. Preparations were made for mucosal resection. Saline was       injected to raise the lesion. Snare mucosal resection was performed.       Resection and retrieval were complete. To prevent bleeding after mucosal       resection, one hemostatic clip was successfully placed. There was no       bleeding during, or at the end, of the procedure. Margins of polyp       marked using blue or nbi light      A 20 mm polyp was found in the proximal ascending colon. The polyp was       sessile. Biopsies were taken with a cold forceps for histology. Polyp       not resected as it had areas of ulceration and under blue light there       was marked abnormality of pit pattern , lesion was large Area was       tattooed with an injection of 4 mL of Spot (carbon black). placed at       distal portion of the polyp      The exam was otherwise without abnormality on direct and retroflexion       views. Impression:            - Preparation of the colon was fair.                        - Three 4 to 6 mm polyps in the ascending colon,                         removed with a cold snare. Resected and retrieved.                        - One 15 mm polyp in the sigmoid colon, removed with                         mucosal resection. Resected and retrieved. Clip was  placed.                        - One 20 mm polyp in the proximal ascending colon.                         Biopsied. Tattooed.                        - The examination was otherwise normal on direct and                         retroflexion  views.                        - Mucosal resection was performed. Resection and                         retrieval were complete. Recommendation:        - Discharge patient to home (with escort).                        - Resume previous diet.                        - Continue present medications.                        - Await pathology results.                        - Repeat colonoscopy for surveillance based on                         pathology results. Procedure Code(s):     --- Professional ---                        [phone], Colonoscopy, flexible; with endoscopic mucosal                         resection                        45385, 59, Colonoscopy, flexible; with removal of                         tumor(s), polyp(s), or other lesion(s) by snare                         technique                        45381, 59, Colonoscopy, flexible; with directed                         submucosal injection(s), any substance                        54650, 72, Colonoscopy, flexible; with biopsy, single                         or multiple Diagnosis Code(s):     --- Professional ---  K63.5, Polyp of colon                        R19.5, Other fecal abnormalities CPT copyright 2019 American Medical Association. All rights reserved. The codes documented in this report are preliminary and upon coder review may  be revised to meet current compliance requirements. [name], MD [name] MD, MD 06/11/2021 8:41:08 AM This report has been signed electronically. Number of Addenda: 0 Note Initiated On: 06/11/2021 7:16 AM Scope Withdrawal Time: 0 hours 21 minutes 46 seconds  Total Procedure Duration: 0 hours 26 minutes 53 seconds  Estimated Blood Loss:  Estimated blood loss: none.      [facility]

## 2021-06-11 NOTE — Anesthesia Postprocedure Evaluation (Signed)
Anesthesia Post Note  Patient: Debbie [name]  Procedure(s) Performed: COLONOSCOPY WITH PROPOFOL  Patient location during evaluation: PACU Anesthesia Type: General Level of consciousness: awake and alert Pain management: pain level controlled Vital Signs Assessment: post-procedure vital signs reviewed and stable Respiratory status: spontaneous breathing, nonlabored ventilation, respiratory function stable and patient connected to nasal cannula oxygen Cardiovascular status: blood pressure returned to baseline and stable Postop Assessment: no apparent nausea or vomiting Anesthetic complications: no   No notable events documented.   Last Vitals:  Vitals:   06/11/21 0723 06/11/21 0838  BP: (!) 194/88 (!) 126/58  Pulse:  (!) 58  Resp: 20 17  Temp: (!) 35.7 C 36.4 C  SpO2: 97% 100%    Last Pain:  Vitals:   06/11/21 0844  TempSrc:   PainSc: 0-No pain                 Molli Barrows

## 2021-06-12 ENCOUNTER — Encounter: Payer: Self-pay | Admitting: Gastroenterology

## 2021-06-12 ENCOUNTER — Telehealth: Payer: Self-pay

## 2021-06-12 DIAGNOSIS — D122 Benign neoplasm of ascending colon: Secondary | ICD-10-CM

## 2021-06-12 LAB — SURGICAL PATHOLOGY

## 2021-06-12 NOTE — Telephone Encounter (Signed)
Called patient to let her know that her pathology showed features very close to cancer. Therefore, I told her that Dr. Vicente Males wanted her to have a consult with a general surgeon. A referral will be sent to [facility] and that someone will be calling her to schedule the appointment date and time.

## 2021-06-12 NOTE — Telephone Encounter (Signed)
-----   Message from [name], MD sent at 06/12/2021 10:48 AM EST ----- Inform that the biopsy of the lesion in the ascending colon taken yesterday  showed features very close to cancer- I am concerned about this lesion and would like to refer this patient to Dr Dahlia Byes to discuss about surgery . I am going on vacation hen ce I feel the best way would be to have her see Dr Dahlia Byes. The location has been tatooed in the colon to help guide Dr Barb Merino next steps. Inform her that it is very important that she follows through.   C.c Dr Dahlia Byes   Dr [name] MD,MRCP [facility]) Gastroenterology/Hepatology Pager: [phone]

## 2021-06-12 NOTE — Progress Notes (Signed)
Inform that the biopsy of the lesion in the ascending colon taken yesterday  showed features very close to cancer- I am concerned about this lesion and would like to refer this patient to Dr Dahlia Byes to discuss about surgery . I am going on vacation hence I feel the best way would be to have her see Dr Dahlia Byes. The location has been tatooed in the colon to help guide Dr Barb Merino next steps. Inform her that it is very important that she follows through.   C.c Dr Dahlia Byes   Dr [name] MD,MRCP [facility]) Gastroenterology/Hepatology Pager: [phone]

## 2021-06-13 ENCOUNTER — Other Ambulatory Visit: Payer: Self-pay

## 2021-06-13 DIAGNOSIS — R195 Other fecal abnormalities: Secondary | ICD-10-CM

## 2021-06-13 DIAGNOSIS — D126 Benign neoplasm of colon, unspecified: Secondary | ICD-10-CM

## 2021-06-13 DIAGNOSIS — R7989 Other specified abnormal findings of blood chemistry: Secondary | ICD-10-CM

## 2021-06-13 DIAGNOSIS — D122 Benign neoplasm of ascending colon: Secondary | ICD-10-CM

## 2021-06-18 ENCOUNTER — Other Ambulatory Visit: Payer: Self-pay

## 2021-06-18 ENCOUNTER — Ambulatory Visit (INDEPENDENT_AMBULATORY_CARE_PROVIDER_SITE_OTHER): Payer: Medicare Other | Admitting: Surgery

## 2021-06-18 ENCOUNTER — Telehealth: Payer: Self-pay

## 2021-06-18 ENCOUNTER — Encounter: Payer: Self-pay | Admitting: Surgery

## 2021-06-18 VITALS — BP 183/88 | HR 73 | Temp 96.7°F | Ht 65.5 in | Wt 255.0 lb

## 2021-06-18 DIAGNOSIS — D122 Benign neoplasm of ascending colon: Secondary | ICD-10-CM

## 2021-06-18 DIAGNOSIS — Z01812 Encounter for preprocedural laboratory examination: Secondary | ICD-10-CM

## 2021-06-18 DIAGNOSIS — K635 Polyp of colon: Secondary | ICD-10-CM | POA: Diagnosis not present

## 2021-06-18 DIAGNOSIS — D126 Benign neoplasm of colon, unspecified: Secondary | ICD-10-CM

## 2021-06-18 DIAGNOSIS — K6389 Other specified diseases of intestine: Secondary | ICD-10-CM

## 2021-06-18 DIAGNOSIS — R195 Other fecal abnormalities: Secondary | ICD-10-CM

## 2021-06-18 DIAGNOSIS — R7989 Other specified abnormal findings of blood chemistry: Secondary | ICD-10-CM

## 2021-06-18 NOTE — Progress Notes (Signed)
Patient ID: Debbie [name], [name]   DOB: 31-Aug-1955, 65 y.o.   MRN: [id number]  HPI Debbie [name] is a 65 y.o. [name] seen in consultation at the request of Dr. Vicente Males for an ascending colon mass , she recently had Cologuard + and underwent a colonoscopy.  Please note that I personally reviewed.  There is evidence of a 20 mm polyp in the ascending colon unable to be resected.  She did have an additional sigmoid colon that it was consistent with tubular adenoma.  The ascending colon mass had high-grade dysplasia.  Polyp was tattooed. She does have a history of peripheral vascular disease had a right fem distal bypass with native vein several years ago.  She is currently on Plavix and aspirin.  She also has a femoral stent.  Please note that I have personally reviewed the endoscopy BMP was completely normal and hemoglobin A1c was 6.1.  CEA was normal.  She does have a pending CT scan this week She is able to perform more than 4 METS of activity.  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  She is currently a Theme park manager at [employer]. Hx CAD and prior MI He did have an ultrasound of the right upper quadrant that have personally reviewed showing hepatic steatosis.  No gallstones.   HPI  Past Medical History:  Diagnosis Date   Genital warts    Hyperlipidemia    Hypertension    Myocardial infarction [facility])    Peripheral arterial disease ([place])    Status post angioplasty with stent    Known femoral stent occlusion    Past Surgical History:  Procedure Laterality Date   ABDOMINAL AORTAGRAM N/A 08/09/2011   Procedure: ABDOMINAL Maxcine Ham;  Surgeon: Elam Dutch, MD;  Location: [facility] CATH LAB;  Service: Cardiovascular;  Laterality: N/A;   ANGIOPLASTY / STENTING FEMORAL  2008   Right femoral artery stenting done in [place]   COLONOSCOPY WITH PROPOFOL N/A 06/11/2021   Procedure: COLONOSCOPY WITH PROPOFOL;  Surgeon: [name], MD;  Location: [facility] ENDOSCOPY;  Service: Gastroenterology;   Laterality: N/A;  2ND ARRIVAL, PLEASE   FEMORAL BYPASS Right 08/2011   Dr. Tobie Poet at [facility] History  Problem Relation Age of Onset   Uterine cancer Mother    Diabetes Sister    Hypertension Sister    Breast cancer Maternal Aunt    Stroke Maternal Uncle     Social History Social History   Tobacco Use   Smoking status: Never   Smokeless tobacco: Never  Vaping Use   Vaping Use: Never used  Substance Use Topics   Alcohol use: No   Drug use: No    Allergies  Allergen Reactions   Sulfa Antibiotics     Current Outpatient Medications  Medication Sig Dispense Refill   aspirin EC 81 MG tablet Take by mouth.     clopidogrel (PLAVIX) 75 MG tablet Take 75 mg by mouth daily.     polyethylene glycol-electrolytes (NULYTELY) 420 g solution Prepare according to package instructions. Starting at 5:00 PM: Drink one 8 oz glass of mixture every 15 minutes until you finish half of the jug. Five hours prior to procedure, drink 8 oz glass of mixture every 15 minutes until it is all gone. Make sure you do not drink anything 4 hours prior to your procedure. 4000 mL 0   rosuvastatin (CRESTOR) 20 MG tablet Take 20 mg by mouth at bedtime.     valsartan-hydrochlorothiazide (DIOVAN-HCT) 160-25 MG  tablet Take by mouth.     No current facility-administered medications for this visit.     Review of Systems Full ROS  was asked and was negative except for the information on the HPI  Physical Exam Last menstrual period 07/09/2007. CONSTITUTIONAL: NAD. EYES: Pupils are equal, round,  Sclera are non-icteric. EARS, NOSE, MOUTH AND THROAT: She is wearing a mask. Hearing is intact to voice. LYMPH NODES:  Lymph nodes in the neck are normal. RESPIRATORY:  Lungs are clear. There is normal respiratory effort, with equal breath sounds bilaterally, and without pathologic use of accessory muscles. CARDIOVASCULAR: Heart is regular without murmurs, gallops, or rubs. GI: The abdomen is  soft, nontender, and  nondistended. There are no palpable masses. There is no hepatosplenomegaly. There are normal bowel sounds in all quadrants. GU: Rectal deferred.   MUSCULOSKELETAL: Normal muscle strength and tone. No cyanosis or edema.  Prior scars on the right leg from prior Fem distal bypass SKIN: Turgor is good and there are no pathologic skin lesions or ulcers. NEUROLOGIC: Motor and sensation is grossly normal. Cranial nerves are grossly intact. PSYCH:  Oriented to person, place and time. Affect is normal.  Data Reviewed  I have personally reviewed the patient's imaging, laboratory findings and medical records.    Assessment/Plan This is a 65 year old [name] with an ascending colon unresectable polyp. i discussed with the patient in detail about her disease process.  Given the endoscopic findings certainly that polyp can be a upgraded to adenocarcinoma I DisCussed with the patient in detail about options and I do recommend completion colectomy.  I do think that she will be a good candidate for minimally invasive approach.  Procedure discussed with the patient in detail.  Risks, benefits and possible medications including but not limited to, bleeding, infection anastomotic leak, pain and upgrading of the pathology. Is extremely hesitant about surgical intervention and is requesting a second opinion.  We talked about the possibility of endoscopic mucosal resection.  We have sent a referral to Dr. Rush Landmark and actually have spoken with him regarding this case.  He is willing to see her in consultation. I also disclosed with her that there is a risk associated with endoscopic mucosal resection to include perforation.  Advise endoscopy will be able to provide more information regarding this risk. She is a skeptical about the need for colon resection and even about the need for an extensive endoscopic mucosal resection.  I also discussed with her about the inability to appropriately stage the patient with EMR were she  had to have invasive adenocarcinoma. Spent greater than 90 minutes in this encounter including extensive counseling, coordination of her care, placing orders and performing appropriate documentation    Caroleen Hamman, MD FACS General Surgeon 06/18/2021, 1:43 PM

## 2021-06-18 NOTE — Telephone Encounter (Signed)
Patient saw Dr. Dahlia Byes today 06/18/2021 and they have a plan. Patient also has her CT Scan approved and has her appointment scheduled with [facility].

## 2021-06-18 NOTE — Telephone Encounter (Signed)
error 

## 2021-06-18 NOTE — Patient Instructions (Addendum)
Please see your follow up appointment. Referral  placed to Dr.Gabriel Mansouraty .Someone from their office will call you within 7-10 days to schedule an appointment. If you do not hear from their office within the time frame listed above please call our office an let us know.

## 2021-06-19 ENCOUNTER — Telehealth: Payer: Self-pay | Admitting: Gastroenterology

## 2021-06-19 ENCOUNTER — Telehealth: Payer: Self-pay

## 2021-06-19 LAB — CREATININE, SERUM
Creatinine, Ser: 0.82 mg/dL (ref 0.57–1.00)
eGFR: 78 mL/min/{1.73_m2} (ref >59)

## 2021-06-19 LAB — CEA: CEA: 4 ng/mL (ref 0.0–4.7)

## 2021-06-19 NOTE — Telephone Encounter (Signed)
Hi Dr. Rush Landmark,   We received an urgent referral for a mass of colon. She is currently with [facility] and her records are available in [place].   Please let me know if you need anything else and advise on scheduling.  Thanks

## 2021-06-19 NOTE — Telephone Encounter (Signed)
07/10/21 at 930 am appt scheduled with GM to discuss Colon EMR for rectal mass

## 2021-06-19 NOTE — Telephone Encounter (Signed)
Left message on machine to call back to offer Colon EMR spot and provide appt information

## 2021-06-19 NOTE — Telephone Encounter (Signed)
Left detailed VM letting patient know she can call Dr.Mansouraty at [facility] to schedule consult for second opinion-I also spoke with Janett Billow at [facility] to see if referral was received and was told yes.

## 2021-06-19 NOTE — Telephone Encounter (Signed)
Patty, This is the patient that Dr. Dahlia Byes and I discussed yesterday. Please schedule patient for clinic visit to discuss advanced resection techniques and offer her a colonoscopy with EMR slot (since we filled up so quickly). If she wants to wait until after the clinic visit that is her decision. Please update Dr. Dahlia Byes and myself when she is scheduled. Thanks. GM

## 2021-06-20 ENCOUNTER — Ambulatory Visit
Admission: RE | Admit: 2021-06-20 | Discharge: 2021-06-20 | Disposition: A | Discharge status: Home / Self Care | Payer: Medicare Other | Source: Ambulatory Visit | Attending: Gastroenterology | Admitting: Gastroenterology

## 2021-06-20 ENCOUNTER — Other Ambulatory Visit: Payer: Self-pay

## 2021-06-20 DIAGNOSIS — K6289 Other specified diseases of anus and rectum: Secondary | ICD-10-CM

## 2021-06-20 DIAGNOSIS — D126 Benign neoplasm of colon, unspecified: Secondary | ICD-10-CM | POA: Diagnosis present

## 2021-06-20 DIAGNOSIS — D122 Benign neoplasm of ascending colon: Secondary | ICD-10-CM | POA: Insufficient documentation

## 2021-06-20 MED ORDER — IOHEXOL 300 MG/ML  SOLN
100.0000 mL | Freq: Once | INTRAMUSCULAR | Status: AC | PRN
  Administered 2021-06-20: 09:00:00 100 mL via INTRAVENOUS

## 2021-06-20 MED ORDER — PEG 3350-KCL-NA BICARB-NACL 420 G PO SOLR: 4000.0000 mL | Freq: Once | ORAL | 0 refills | Status: AC

## 2021-06-20 NOTE — Telephone Encounter (Signed)
I have spoken with the pt and she is aware of the office visit on 07/10/21 at 930 am and the colon EMR on 08/2021.  She will be given the written instructions at that visit.  All information has also been sent to My Chart.  The pt would like sooner appt for colon if we have any cancellations I have sent a request to hold plavix to the prescriber.

## 2021-06-20 NOTE — Telephone Encounter (Signed)
[facility] Dr Ezzard Flax is the prescriber of the plavix. Letter faxed to that office.

## 2021-06-21 ENCOUNTER — Encounter: Payer: Self-pay | Admitting: Surgery

## 2021-06-22 ENCOUNTER — Encounter: Payer: Self-pay | Admitting: Gastroenterology

## 2021-06-22 ENCOUNTER — Ambulatory Visit (HOSPITAL_BASED_OUTPATIENT_CLINIC_OR_DEPARTMENT_OTHER): Payer: Medicare Other | Admitting: Obstetrics & Gynecology

## 2021-06-25 ENCOUNTER — Ambulatory Visit (HOSPITAL_BASED_OUTPATIENT_CLINIC_OR_DEPARTMENT_OTHER): Payer: Medicare Other | Admitting: Obstetrics & Gynecology

## 2021-06-28 ENCOUNTER — Telehealth: Payer: Self-pay | Admitting: Gastroenterology

## 2021-06-28 NOTE — Telephone Encounter (Signed)
Patient called this morning.  Please make sure you cancel this patient's hospital procedure for February 20.  Thank you.

## 2021-07-03 NOTE — Telephone Encounter (Signed)
I spoke with the pt and confirmed that she does wish to cancel the Feb 20 case.  She has a sooner appt at [facility]  The appt has been cancelled per her instructions.

## 2021-07-04 ENCOUNTER — Telehealth: Payer: Self-pay | Admitting: Gastroenterology

## 2021-07-04 NOTE — Telephone Encounter (Signed)
Inbound call from pt returning your phone call. Thank you.

## 2021-07-04 NOTE — Telephone Encounter (Signed)
I called at 9:25 AM to return a phone call as I was away on vacation we will try and call her later this morning

## 2021-07-10 ENCOUNTER — Ambulatory Visit: Payer: Medicare Other | Admitting: Gastroenterology

## 2021-07-11 ENCOUNTER — Ambulatory Visit: Payer: Medicare Other | Admitting: Surgery

## 2021-08-06 ENCOUNTER — Encounter (HOSPITAL_BASED_OUTPATIENT_CLINIC_OR_DEPARTMENT_OTHER): Payer: Self-pay

## 2021-08-06 ENCOUNTER — Ambulatory Visit: Payer: Medicare Other | Admitting: Surgery

## 2021-08-06 ENCOUNTER — Ambulatory Visit (HOSPITAL_BASED_OUTPATIENT_CLINIC_OR_DEPARTMENT_OTHER): Payer: Medicare Other | Admitting: Obstetrics & Gynecology

## 2021-08-07 ENCOUNTER — Telehealth: Payer: Self-pay

## 2021-08-07 NOTE — Telephone Encounter (Signed)
Patient called on Friday and today requesting to speak the the physician in reference to her consult with the surgeon that she went to see.

## 2021-08-07 NOTE — Telephone Encounter (Signed)
Called patient to set up a MyChart appointment with Dr. Vicente Males tomorrow. Patient agreed.

## 2021-08-07 NOTE — Telephone Encounter (Signed)
Set up video visit tomorrow or telephone visit

## 2021-08-08 ENCOUNTER — Ambulatory Visit (INDEPENDENT_AMBULATORY_CARE_PROVIDER_SITE_OTHER): Payer: Medicare Other | Admitting: Obstetrics & Gynecology

## 2021-08-08 ENCOUNTER — Other Ambulatory Visit (HOSPITAL_COMMUNITY)
Admission: RE | Admit: 2021-08-08 | Discharge: 2021-08-08 | Disposition: A | Discharge status: Home / Self Care | Payer: BC Managed Care – PPO | Source: Ambulatory Visit | Attending: Obstetrics & Gynecology | Admitting: Obstetrics & Gynecology

## 2021-08-08 ENCOUNTER — Telehealth (INDEPENDENT_AMBULATORY_CARE_PROVIDER_SITE_OTHER): Payer: Medicare Other | Admitting: Gastroenterology

## 2021-08-08 ENCOUNTER — Encounter (HOSPITAL_BASED_OUTPATIENT_CLINIC_OR_DEPARTMENT_OTHER): Payer: Self-pay | Admitting: Obstetrics & Gynecology

## 2021-08-08 ENCOUNTER — Other Ambulatory Visit: Payer: Self-pay

## 2021-08-08 VITALS — BP 162/66 | HR 61 | Ht 65.5 in | Wt 249.0 lb

## 2021-08-08 DIAGNOSIS — Z780 Asymptomatic menopausal state: Secondary | ICD-10-CM | POA: Diagnosis not present

## 2021-08-08 DIAGNOSIS — I739 Peripheral vascular disease, unspecified: Secondary | ICD-10-CM

## 2021-08-08 DIAGNOSIS — Z124 Encounter for screening for malignant neoplasm of cervix: Secondary | ICD-10-CM | POA: Insufficient documentation

## 2021-08-08 DIAGNOSIS — C182 Malignant neoplasm of ascending colon: Secondary | ICD-10-CM

## 2021-08-08 DIAGNOSIS — I10 Essential (primary) hypertension: Secondary | ICD-10-CM

## 2021-08-08 DIAGNOSIS — Z9289 Personal history of other medical treatment: Secondary | ICD-10-CM

## 2021-08-08 DIAGNOSIS — Z01419 Encounter for gynecological examination (general) (routine) without abnormal findings: Secondary | ICD-10-CM

## 2021-08-08 DIAGNOSIS — Z8049 Family history of malignant neoplasm of other genital organs: Secondary | ICD-10-CM

## 2021-08-08 DIAGNOSIS — E7800 Pure hypercholesterolemia, unspecified: Secondary | ICD-10-CM | POA: Diagnosis not present

## 2021-08-08 MED ORDER — BETAMETHASONE DIPROPIONATE 0.05 % EX CREA: TOPICAL_CREAM | Freq: Two times a day (BID) | CUTANEOUS | 0 refills | Status: DC

## 2021-08-08 MED ORDER — CLOPIDOGREL BISULFATE 75 MG PO TABS: 75.0000 mg | ORAL_TABLET | Freq: Every day | ORAL | Status: DC

## 2021-08-08 NOTE — Progress Notes (Signed)
66 y.o. [place] female here for breast and pelvic exam.  Denies vaginal bleeding.  Finally had colonoscopy last year.  Has new diagnosis of colon cancer with signet cells findings.  She is followed at [facility].  She is scheduled for next week.  She did have a CT scan that did not show any metastatic disease.  CEA was 4.0.  Immunohistochemistry was negative for Lynch Syndrome.    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  Patient's last menstrual period was 07/09/2007 (approximate).          Sexually active: No.  H/O STD:  no  Health Maintenance: PCP:  Dr. Jefm Bryant.  Last wellness appt was 04/2021.  Did blood work at that appt:  yes Vaccines are up to date:  has declined pneumonia, shingles, and influenza vaccination Colonoscopy:  06/11/2021 MMG:  09/14/2020 Negative BMD:  02/24/2018 Last pap smear:  06/19/2020.   H/o abnormal pap smear:  06/2020   reports that she has never smoked. She has never used smokeless tobacco. She reports that she does not drink alcohol and does not use drugs.  Past Medical History:  Diagnosis Date   Genital warts    Hyperlipidemia    Hypertension    Myocardial infarction [facility])    Peripheral arterial disease ([place])    Status post angioplasty with stent    Known femoral stent occlusion    Past Surgical History:  Procedure Laterality Date   ABDOMINAL AORTAGRAM N/A 08/09/2011   Procedure: ABDOMINAL Maxcine Ham;  Surgeon: Elam Dutch, MD;  Location: [facility] CATH LAB;  Service: Cardiovascular;  Laterality: N/A;   ANGIOPLASTY / STENTING FEMORAL  2008   Right femoral artery stenting done in [place]   COLONOSCOPY WITH PROPOFOL N/A 06/11/2021   Procedure: COLONOSCOPY WITH PROPOFOL;  Surgeon: [name], MD;  Location: [facility] ENDOSCOPY;  Service: Gastroenterology;  Laterality: N/A;  2ND ARRIVAL, PLEASE   FEMORAL BYPASS Right 08/2011   Dr. Tobie Poet at [facility]    Current Outpatient Medications  Medication Sig Dispense Refill   aspirin  81 MG EC tablet Take 1 tablet by mouth daily.     Magnesium 100 MG CAPS Take 1 mg by mouth 1 day or 1 dose.     metroNIDAZOLE (FLAGYL) 500 MG tablet Take 1 tablet at 3 pm, 4 pm, and 10 pm the day prior to your surgery     ondansetron (ZOFRAN) 8 MG tablet Take 8 mg by mouth every 8 (eight) hours as needed.     polyethylene glycol-electrolytes (NULYTELY) 420 g solution Prepare according to package instructions. Starting at 5:00 PM: Drink one 8 oz glass of mixture every 15 minutes until you finish half of the jug. Five hours prior to procedure, drink 8 oz glass of mixture every 15 minutes until it is all gone. Make sure you do not drink anything 4 hours prior to your procedure. 4000 mL 0   rosuvastatin (CRESTOR) 20 MG tablet Take 20 mg by mouth at bedtime.     triamcinolone ointment (KENALOG) 0.1 %      valsartan-hydrochlorothiazide (DIOVAN-HCT) 160-25 MG tablet Take by mouth.     clopidogrel (PLAVIX) 75 MG tablet Take 1 tablet (75 mg total) by mouth daily.     No current facility-administered medications for this visit.    Family History  Problem Relation Age of Onset   Uterine cancer Mother    Diabetes Sister    Hypertension Sister    Breast cancer Maternal Aunt  Stroke Maternal Uncle     Review of Systems  Constitutional: Negative.   Respiratory: Negative.    Cardiovascular: Negative.   Gastrointestinal: Negative.   Genitourinary: Negative.    Exam:   BP (!) 162/66 (BP Location: Left Arm, Patient Position: Sitting, Cuff Size: Large)    Pulse 61    Ht 5' 5.5" (1.664 m) Comment: reported   Wt 249 lb (112.9 kg) Comment: reported   LMP 07/09/2007 (Approximate)    BMI 40.81 kg/m   Height: 5' 5.5" (166.4 cm) (reported)  General appearance: alert, cooperative and appears stated age Breasts: normal appearance, no masses or tenderness Abdomen: soft, non-tender; bowel sounds normal; no masses,  no organomegaly Lymph nodes: Cervical, supraclavicular, and axillary nodes normal.  No abnormal  inguinal nodes palpated Neurologic: Grossly normal  Pelvic: External genitalia:  no lesions              Urethra:  normal appearing urethra with no masses, tenderness or lesions              Bartholins and Skenes: normal                 Vagina: normal appearing vagina with atrophic changes and no discharge, no lesions              Cervix: no lesions              Pap taken: No. Bimanual Exam:  Uterus:  normal size, contour, position, consistency, mobility, non-tender              Adnexa: normal adnexa and no mass, fullness, tenderness               Rectovaginal: Confirms               Anus:  normal sphincter tone, no lesions  Chaperone, Octaviano Batty, CMA, was present for exam.  Assessment/Plan: 1. Encntr for gyn exam (general) (routine) w/o abn findings - pt desires pap smear today.  Aware pap was negative in 2021.  We have discussed guidelines in the past. - MMG 09/2020 - BMD 2019 - vaccines reviewed/updated - lab work done with Dr. Jefm Bryant  2. Postmenopausal - no HRT  3. Family history of uterine cancer  4. Malignant neoplasm of ascending colon ([place]) - has surgery scheduled next week at [facility]  5. Pure hypercholesterolemia - on statin  6. Primary hypertension - on valsartan/HCTZ  7. PAD (peripheral artery disease) (HCC)  8. Vulvar itching - topical betamethasone BID up to 5 days can be used.  No abnormal finding on exam noted today.  If continues with steroid, will recommend proceeding with skin biopsy.

## 2021-08-08 NOTE — Progress Notes (Signed)
[name] , MD [address]  [place]  [place], [place] [address]  Main: [phone]  Fax: [phone]   Primary Care Physician: Valera Castle, MD  Virtual Visit via Video Note  I connected with patient on 08/08/21 at 12:45 PM EST by video and verified that I am speaking with the correct person using two identifiers.   I discussed the limitations, risks, security and privacy concerns of performing an evaluation and management service by video  and the availability of in person appointments. I also discussed with the patient that there may be a patient responsible charge related to this service. The patient expressed understanding and agreed to proceed.  Location of Patient: Home Location of Provider: Home Persons involved: Patient and provider only   History of Present Illness: Chief Complaint  Patient presents with   Colon Cancer    HPI: Debbie [name] is a 66 y.o. female  She is here today to discuss about her upcoming colectomy which is being performed for colon cancer.  She initially was seen by myself for a positive Cologuard test and underwent a colonoscopy on 06/11/2021.  There were 3 sessile polyps in the ascending colon subcentimeter resected.  A 20 mm polyp was found in the proximal ascending colon which had features of possible malignancy with induration in the center.  I did not attempt to resect the area but took a biopsy from the edge which showed an adenoma with high-grade dysplasia.  Area was tattooed to help with the surgeon to resect.  I subsequently referred her to see Dr. Dahlia Byes who recommended surgery but the patient requested a second opinion for endoscopic mucosal resection which was attempted at [facility] by Dr. 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  For unclear reasons she subsequently went to [facility] for surgical opinion and has been recommended to undergo surgery which she has scheduled on 08/14/2021.    Current Outpatient Medications  Medication Sig Dispense Refill   aspirin 81 MG EC tablet Take 1 tablet by mouth daily.     Cholecalciferol 25 MCG (1000 UT) tablet Take 1 tablet by mouth daily.     clopidogrel (PLAVIX) 75 MG tablet Take 75 mg by mouth daily.     erythromycin base (E-MYCIN) 500 MG tablet Take 1,000 mg by mouth daily.     Magnesium 100 MG CAPS Take 1 mg by mouth 1 day or 1 dose.     metroNIDAZOLE (FLAGYL) 500 MG tablet Take 1 tablet at 3 pm, 4 pm, and 10 pm the day prior to your surgery     ondansetron (ZOFRAN) 8 MG tablet Take 8 mg by mouth every 8 (eight) hours as needed.     polyethylene glycol-electrolytes (NULYTELY) 420 g solution Prepare according to package instructions. Starting at 5:00 PM: Drink one 8 oz glass of mixture every 15 minutes until you finish half of the jug. Five hours prior to procedure, drink 8 oz glass of mixture every 15 minutes until it is all gone. Make sure you do not drink anything 4 hours prior to your procedure. 4000 mL 0   RESTASIS 0.05 % ophthalmic emulsion      rosuvastatin (CRESTOR) 20 MG tablet Take 20 mg by mouth at bedtime.     triamcinolone ointment (KENALOG) 0.1 %      valsartan-hydrochlorothiazide (DIOVAN-HCT) 160-25 MG tablet  Take by mouth.     No current facility-administered medications for this visit.    Allergies as of 08/08/2021 - Review Complete 06/21/2021  Allergen Reaction Noted   Sulfa antibiotics  08/01/2011    Review of Systems:    All systems reviewed and negative except where noted in HPI.  General Appearance:    Alert, cooperative, no distress, appears stated age  Head:    Normocephalic, without obvious abnormality, atraumatic  Eyes:    PERRL, conjunctiva/corneas clear,  Ears:    Grossly normal hearing    Neurologic:  Grossly normal     Observations/Objective:  Labs: CMP     Component Value Date/Time   NA 146 (H) 11/28/2017 1656   K 3.9 11/28/2017 1656   CL 105 11/28/2017 1656   CO2 25 11/28/2017 1656   GLUCOSE 110 (H) 11/28/2017 1656   GLUCOSE 118 (H) 08/09/2011 0624   BUN 29 (H) 11/28/2017 1656   CREATININE 0.82 06/18/2021 1304   CALCIUM 10.0 11/28/2017 1656   PROT 6.7 11/28/2017 1656   ALBUMIN 4.2 11/28/2017 1656   AST 27 11/28/2017 1656   ALT 25 11/28/2017 1656   ALKPHOS 64 11/28/2017 1656   BILITOT <0.2 11/28/2017 1656   GFRNONAA 50 (L) 11/28/2017 1656   GFRAA 58 (L) 11/28/2017 1656   Lab Results  Component Value Date   WBC 6.5 11/28/2017   HGB 13.3 11/28/2017   HCT 40.9 11/28/2017   MCV 81 11/28/2017   PLT 237 11/28/2017    Imaging Studies: No results found.  Assessment and Plan:   Debbie [name] is a 66 y.o. y/o female is here today to discuss about her upcoming right hemicolectomy at [facility] for colon cancer detected on colonoscopy after a positive Cologuard.  She was previously seen by Dr. Dahlia Byes at [facility] who also recommended surgery but the patient requested a second opinion to see if the polyp could be resected endoscopically which failed and hence surgery was again recommended and subsequently was seen by a surgeon at [facility] and has been scheduled for surgery .We discussed about staging , surgery and lymph node sampling , Answered her questions   Follow up in 1 year for repeat colonoscopy . Advised all her 10 siblings should have a colonoscopy . Advised her to have her daughter get a colonoscopy at age 59     I discussed the assessment and treatment plan with the patient. The patient was provided an opportunity to ask questions and all were answered. The patient agreed with the plan and demonstrated an understanding of the instructions.   The patient was advised to call back or seek an in-person evaluation if the symptoms worsen or if the condition fails to improve as anticipated.  I provided  21 minutes of face-to-face time during this encounter.  Dr [name] MD,MRCP [facility]) Gastroenterology/Hepatology Pager: [phone]   Speech recognition software was used to dictate this note.

## 2021-08-09 LAB — CYTOLOGY - PAP: Diagnosis: NEGATIVE

## 2021-08-14 DIAGNOSIS — C189 Malignant neoplasm of colon, unspecified: Secondary | ICD-10-CM | POA: Insufficient documentation

## 2021-08-15 ENCOUNTER — Encounter: Payer: Self-pay | Admitting: Gastroenterology

## 2021-08-21 ENCOUNTER — Other Ambulatory Visit: Payer: Self-pay | Admitting: Orthopedic Surgery

## 2021-08-21 ENCOUNTER — Other Ambulatory Visit (HOSPITAL_COMMUNITY): Payer: Self-pay | Admitting: Orthopedic Surgery

## 2021-08-21 DIAGNOSIS — M25559 Pain in unspecified hip: Secondary | ICD-10-CM

## 2021-08-23 ENCOUNTER — Telehealth (INDEPENDENT_AMBULATORY_CARE_PROVIDER_SITE_OTHER): Payer: Medicare Other | Admitting: Gastroenterology

## 2021-08-23 ENCOUNTER — Encounter: Payer: Self-pay | Admitting: Gastroenterology

## 2021-08-23 DIAGNOSIS — C182 Malignant neoplasm of ascending colon: Secondary | ICD-10-CM | POA: Diagnosis not present

## 2021-08-23 NOTE — Progress Notes (Signed)
Debbie [name] , MD [address]  [place]  [place], [place] [address]  Main: [phone]  Fax: [phone]   Primary Care Physician: Valera Castle, MD  Virtual Visit via Video Note  I connected with patient on 08/23/21 at 12:45 PM EST by video and verified that I am speaking with the correct person using two identifiers.   I discussed the limitations, risks, security and privacy concerns of performing an evaluation and management service by video  and the availability of in person appointments. I also discussed with the patient that there may be a patient responsible charge related to this service. The patient expressed understanding and agreed to proceed.  Location of Patient: Home Location of Provider: Home Persons involved: Patient and provider only   History of Present Illness: Chief Complaint  Patient presents with   Colon Cancer    HPI: Debbie [name] is a 66 y.o. female  Summary of history : She initially was seen by myself for a positive Cologuard test and underwent a colonoscopy on 06/11/2021.  There were 3 sessile polyps in the ascending colon subcentimeter resected.  A 20 mm polyp was found in the proximal ascending colon which had features of possible malignancy with induration in the center.  I did not attempt to resect the area but took a biopsy from the edge which showed an adenoma with high-grade dysplasia.  Area was tattooed to help with the surgeon to resect.  I subsequently referred her to see Dr. Dahlia Byes who recommended surgery but the patient requested a second opinion for endoscopic mucosal resection which was attempted at [facility] by Dr. 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   For unclear reasons she subsequently went to [facility] for surgical opinion and   subsequently had a Rt hemicolectomy . There were no lymph nodes involved.      Current Outpatient Medications  Medication Sig Dispense Refill   aspirin 81 MG EC tablet Take 1 tablet by mouth daily.     betamethasone dipropionate 0.05 % cream Apply topically 2 (two) times daily. Do not use for more than 10 days 30 g 0   clopidogrel (PLAVIX) 75 MG tablet Take 1 tablet (75 mg total) by mouth daily.     Magnesium 100 MG CAPS Take 1 mg by mouth 1 day or 1 dose.     metroNIDAZOLE (FLAGYL) 500 MG tablet Take 1 tablet at 3 pm, 4 pm, and 10 pm the day prior to your surgery     ondansetron (ZOFRAN) 8 MG tablet Take 8 mg by mouth every 8 (eight) hours as needed.     polyethylene glycol-electrolytes (NULYTELY) 420 g solution Prepare according to package instructions. Starting at 5:00 PM: Drink one 8 oz glass of mixture every 15 minutes until you finish half of the jug. Five hours prior to procedure, drink 8 oz glass of mixture every 15 minutes until it is all gone. Make sure you do not drink anything 4 hours prior to your procedure. 4000 mL 0   rosuvastatin (CRESTOR) 20 MG tablet Take 20 mg by mouth at bedtime.     triamcinolone ointment (KENALOG) 0.1 %      valsartan-hydrochlorothiazide (DIOVAN-HCT) 160-25 MG tablet Take by mouth.     No current facility-administered medications for this visit.    Allergies as of 08/23/2021 - Review Complete 08/08/2021  Allergen Reaction Noted   Sulfa antibiotics  08/01/2011    Review of Systems:    All systems reviewed and negative except where noted in HPI.  General Appearance:    Alert, cooperative, no distress, appears stated age  Head:    Normocephalic, without obvious abnormality, atraumatic  Eyes:    PERRL, conjunctiva/corneas clear,  Ears:    Grossly normal hearing    Neurologic:  Grossly normal    Observations/Objective:  Labs: CMP     Component Value Date/Time   NA 146 (H) 11/28/2017 1656   K 3.9 11/28/2017 1656   CL 105 11/28/2017 1656    CO2 25 11/28/2017 1656   GLUCOSE 110 (H) 11/28/2017 1656   GLUCOSE 118 (H) 08/09/2011 0624   BUN 29 (H) 11/28/2017 1656   CREATININE 0.82 06/18/2021 1304   CALCIUM 10.0 11/28/2017 1656   PROT 6.7 11/28/2017 1656   ALBUMIN 4.2 11/28/2017 1656   AST 27 11/28/2017 1656   ALT 25 11/28/2017 1656   ALKPHOS 64 11/28/2017 1656   BILITOT <0.2 11/28/2017 1656   GFRNONAA 50 (L) 11/28/2017 1656   GFRAA 58 (L) 11/28/2017 1656   Lab Results  Component Value Date   WBC 6.5 11/28/2017   HGB 13.3 11/28/2017   HCT 40.9 11/28/2017   MCV 81 11/28/2017   PLT 237 11/28/2017    Imaging Studies: No results found.  Assessment and Plan:   Debbie [name] is a 66 y.o. y/o female recently underwent a right hemicolectomy at [facility] for colon cancer seen in a polyp that could not be resected endoscopically. No lymph node metastasis 0/16 , T1 .    Plan  Colonoscopy in 1 year      I discussed the assessment and treatment plan with the patient. The patient was provided an opportunity to ask questions and all were answered. The patient agreed with the plan and demonstrated an understanding of the instructions.   The patient was advised to call back or seek an in-person evaluation if the symptoms worsen or if the condition fails to improve as anticipated.  I provided 20 minutes of face-to-face time during this encounter.  Dr Debbie Bellows MD,MRCP [facility]) Gastroenterology/Hepatology Pager: [phone]   Speech recognition software was used to dictate this note.

## 2021-08-27 ENCOUNTER — Ambulatory Visit (HOSPITAL_COMMUNITY): Admit: 2021-08-27 | Payer: Medicare Other | Admitting: Gastroenterology

## 2021-08-27 ENCOUNTER — Encounter (HOSPITAL_COMMUNITY): Payer: Self-pay

## 2021-08-27 SURGERY — COLONOSCOPY WITH PROPOFOL
Anesthesia: Monitor Anesthesia Care

## 2021-09-03 ENCOUNTER — Other Ambulatory Visit: Payer: Self-pay

## 2021-09-03 ENCOUNTER — Ambulatory Visit
Admission: RE | Admit: 2021-09-03 | Discharge: 2021-09-03 | Disposition: A | Discharge status: Home / Self Care | Payer: BC Managed Care – PPO | Source: Ambulatory Visit | Attending: Orthopedic Surgery | Admitting: Orthopedic Surgery

## 2021-09-03 DIAGNOSIS — M25559 Pain in unspecified hip: Secondary | ICD-10-CM | POA: Diagnosis not present

## 2022-01-11 ENCOUNTER — Other Ambulatory Visit: Payer: Self-pay | Admitting: Orthopedic Surgery

## 2022-01-25 ENCOUNTER — Other Ambulatory Visit: Payer: BC Managed Care – PPO

## 2022-02-04 ENCOUNTER — Ambulatory Visit: Admit: 2022-02-04 | Payer: BC Managed Care – PPO | Admitting: Orthopedic Surgery

## 2022-02-04 SURGERY — ARTHROSCOPIC TROCHANTERIC BURSECTOMY
Anesthesia: Choice | Laterality: Left

## 2022-03-21 ENCOUNTER — Encounter (HOSPITAL_BASED_OUTPATIENT_CLINIC_OR_DEPARTMENT_OTHER): Payer: Self-pay | Admitting: Obstetrics & Gynecology

## 2022-03-22 ENCOUNTER — Ambulatory Visit (INDEPENDENT_AMBULATORY_CARE_PROVIDER_SITE_OTHER): Payer: Medicare Other | Admitting: *Deleted

## 2022-03-22 ENCOUNTER — Other Ambulatory Visit (HOSPITAL_COMMUNITY)
Admission: RE | Admit: 2022-03-22 | Discharge: 2022-03-22 | Disposition: A | Discharge status: Home / Self Care | Payer: Medicare Other | Source: Ambulatory Visit | Attending: Obstetrics & Gynecology | Admitting: Obstetrics & Gynecology

## 2022-03-22 DIAGNOSIS — N898 Other specified noninflammatory disorders of vagina: Secondary | ICD-10-CM | POA: Insufficient documentation

## 2022-03-22 NOTE — Progress Notes (Signed)
Pt presents to office with complaints of vaginal odor. Denies discharge, itching or any other symptoms. Pt instructed on self swab, specimen obtained and sent for testing. Advised that we would be in contact with her once results are back.

## 2022-03-25 ENCOUNTER — Encounter (HOSPITAL_BASED_OUTPATIENT_CLINIC_OR_DEPARTMENT_OTHER): Payer: Self-pay | Admitting: Obstetrics & Gynecology

## 2022-03-25 LAB — CERVICOVAGINAL ANCILLARY ONLY
Bacterial Vaginitis (gardnerella): NEGATIVE
Candida Glabrata: NEGATIVE
Candida Vaginitis: NEGATIVE
Comment: NEGATIVE
Comment: NEGATIVE
Comment: NEGATIVE

## 2022-07-07 ENCOUNTER — Emergency Department: Payer: Medicare Other

## 2022-07-07 ENCOUNTER — Emergency Department
Admission: EM | Admit: 2022-07-07 | Discharge: 2022-07-07 | Disposition: A | Discharge status: Home / Self Care | Payer: Medicare Other | Attending: Emergency Medicine | Admitting: Emergency Medicine

## 2022-07-07 ENCOUNTER — Other Ambulatory Visit: Payer: Self-pay

## 2022-07-07 DIAGNOSIS — S0101XA Laceration without foreign body of scalp, initial encounter: Secondary | ICD-10-CM | POA: Insufficient documentation

## 2022-07-07 DIAGNOSIS — Z23 Encounter for immunization: Secondary | ICD-10-CM | POA: Diagnosis not present

## 2022-07-07 DIAGNOSIS — U071 COVID-19: Secondary | ICD-10-CM | POA: Diagnosis not present

## 2022-07-07 DIAGNOSIS — W01198A Fall on same level from slipping, tripping and stumbling with subsequent striking against other object, initial encounter: Secondary | ICD-10-CM | POA: Diagnosis not present

## 2022-07-07 DIAGNOSIS — I10 Essential (primary) hypertension: Secondary | ICD-10-CM | POA: Diagnosis not present

## 2022-07-07 DIAGNOSIS — S0990XA Unspecified injury of head, initial encounter: Secondary | ICD-10-CM | POA: Diagnosis present

## 2022-07-07 DIAGNOSIS — Y9222 Religious institution as the place of occurrence of the external cause: Secondary | ICD-10-CM | POA: Insufficient documentation

## 2022-07-07 LAB — COMPREHENSIVE METABOLIC PANEL
ALT: 36 U/L (ref 0–44)
AST: 39 U/L (ref 15–41)
Albumin: 4.2 g/dL (ref 3.5–5.0)
Alkaline Phosphatase: 50 U/L (ref 38–126)
Anion gap: 9 (ref 5–15)
BUN: 23 mg/dL (ref 8–23)
CO2: 27 mmol/L (ref 22–32)
Calcium: 9.8 mg/dL (ref 8.9–10.3)
Chloride: 100 mmol/L (ref 98–111)
Creatinine, Ser: 1.03 mg/dL — ABNORMAL HIGH (ref 0.44–1.00)
GFR, Estimated: 59 mL/min — ABNORMAL LOW (ref >60)
Glucose, Bld: 108 mg/dL — ABNORMAL HIGH (ref 70–99)
Potassium: 3.9 mmol/L (ref 3.5–5.1)
Sodium: 136 mmol/L (ref 135–145)
Total Bilirubin: 0.5 mg/dL (ref 0.3–1.2)
Total Protein: 7.6 g/dL (ref 6.5–8.1)

## 2022-07-07 LAB — CBC WITH DIFFERENTIAL/PLATELET
Abs Immature Granulocytes: 0.04 10*3/uL (ref 0.00–0.07)
Basophils Absolute: 0 10*3/uL (ref 0.0–0.1)
Basophils Relative: 1 %
Eosinophils Absolute: 0 10*3/uL (ref 0.0–0.5)
Eosinophils Relative: 0 %
HCT: 40.9 % (ref 36.0–46.0)
Hemoglobin: 13.3 g/dL (ref 12.0–15.0)
Immature Granulocytes: 1 %
Lymphocytes Relative: 25 %
Lymphs Abs: 1.5 10*3/uL (ref 0.7–4.0)
MCH: 25.8 pg — ABNORMAL LOW (ref 26.0–34.0)
MCHC: 32.5 g/dL (ref 30.0–36.0)
MCV: 79.4 fL — ABNORMAL LOW (ref 80.0–100.0)
Monocytes Absolute: 1 10*3/uL (ref 0.1–1.0)
Monocytes Relative: 17 %
Neutro Abs: 3.4 10*3/uL (ref 1.7–7.7)
Neutrophils Relative %: 56 %
Platelets: 159 10*3/uL (ref 150–400)
RBC: 5.15 MIL/uL — ABNORMAL HIGH (ref 3.87–5.11)
RDW: 14.9 % (ref 11.5–15.5)
WBC: 5.9 10*3/uL (ref 4.0–10.5)
nRBC: 0 % (ref 0.0–0.2)

## 2022-07-07 LAB — RESP PANEL BY RT-PCR (RSV, FLU A&B, COVID)  RVPGX2
Influenza A by PCR: NEGATIVE
Influenza B by PCR: NEGATIVE
Resp Syncytial Virus by PCR: NEGATIVE
SARS Coronavirus 2 by RT PCR: POSITIVE — AB

## 2022-07-07 LAB — TROPONIN I (HIGH SENSITIVITY): Troponin I (High Sensitivity): 7 ng/L (ref <18)

## 2022-07-07 MED ORDER — ONDANSETRON 4 MG PO TBDP: 4.0000 mg | ORAL_TABLET | Freq: Three times a day (TID) | ORAL | 0 refills | Status: AC | PRN

## 2022-07-07 MED ORDER — LIDOCAINE HCL 1 % IJ SOLN
10.0000 mL | Freq: Once | INTRAMUSCULAR | Status: AC
  Administered 2022-07-07: 10 mL
  Filled 2022-07-07: qty 10

## 2022-07-07 MED ORDER — CEPHALEXIN 500 MG PO CAPS: 500.0000 mg | ORAL_CAPSULE | Freq: Three times a day (TID) | ORAL | 0 refills | Status: AC

## 2022-07-07 MED ORDER — ALBUTEROL SULFATE HFA 108 (90 BASE) MCG/ACT IN AERS: 2.0000 | INHALATION_SPRAY | Freq: Four times a day (QID) | RESPIRATORY_TRACT | 2 refills | Status: DC | PRN

## 2022-07-07 MED ORDER — TETANUS-DIPHTH-ACELL PERTUSSIS 5-2.5-18.5 LF-MCG/0.5 IM SUSY
0.5000 mL | PREFILLED_SYRINGE | Freq: Once | INTRAMUSCULAR | Status: AC
  Administered 2022-07-07: 0.5 mL via INTRAMUSCULAR
  Filled 2022-07-07: qty 0.5

## 2022-07-07 MED ORDER — FLUTICASONE PROPIONATE 50 MCG/ACT NA SUSP: 1.0000 | Freq: Every day | NASAL | 2 refills | Status: DC

## 2022-07-07 MED ORDER — ACETAMINOPHEN 325 MG PO TABS
650.0000 mg | ORAL_TABLET | Freq: Once | ORAL | Status: AC
  Administered 2022-07-07: 650 mg via ORAL
  Filled 2022-07-07: qty 2

## 2022-07-07 NOTE — ED Triage Notes (Signed)
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 Denies any headache today. She did eat this morning. Hx of high BS. Pt takes Plavix for bypass surgery.

## 2022-07-07 NOTE — Discharge Instructions (Addendum)
Take Keflex 3 times daily for the next 7 days Have staples removed in the next 7 days

## 2022-07-07 NOTE — ED Provider Notes (Signed)
[facility] Provider Note  Patient Contact: 5:17 PM (approximate)   History   Head Injury   HPI  Debbie [name] is a 66 y.o. female with a history of prior MI, hyperlipidemia, hypertension and peripheral artery disease, presents to the emergency department after a head injury and a feeling of weakness.  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  Patient states that she felt generally weak and has a sore throat.  She states that her daughter recently visited and was in bed most of the time.  She denies a prodrome of headache during her service and denies current chest pain, chest tightness or shortness of breath.  Patient states that once weakness started, she lost her balance and fell down several steps at her church.  After sitting upright, patient states that she felt okay and returned home but realized that she had a scalp laceration at home.  No numbness or tingling in the upper and lower extremities.      Physical Exam   Triage Vital Signs: ED Triage Vitals  Enc Vitals Group     BP 07/07/22 1711 (!) 167/80     Pulse Rate 07/07/22 1711 72     Resp 07/07/22 1711 18     Temp 07/07/22 1711 98.1 F (36.7 C)     Temp Source 07/07/22 1711 Oral     SpO2 07/07/22 1711 100 %     Weight 07/07/22 1712 242 lb (109.8 kg)     Height 07/07/22 1712 '5\' 5"'$  (1.651 m)     Head Circumference --      Peak Flow --      Pain Score 07/07/22 1713 0     Pain Loc --      Pain Edu? --      Excl. in [place]? --     Most recent vital signs: Vitals:   07/07/22 1711  BP: (!) 167/80  Pulse: 72  Resp: 18  Temp: 98.1 F (36.7 C)  SpO2: 100%     General: Alert and in no acute distress. Eyes:  PERRL. EOMI. Head: No acute traumatic findings. Patient has occipital scalp laceration.  ENT:      Nose: No congestion/rhinnorhea.      Mouth/Throat: Mucous membranes are moist. Neck: No stridor. No cervical  spine tenderness to palpation. Cardiovascular:  Good peripheral perfusion Respiratory: Normal respiratory effort without tachypnea or retractions. Lungs CTAB. Good air entry to the bases with no decreased or absent breath sounds. Gastrointestinal: Bowel sounds 4 quadrants. Soft and nontender to palpation. No guarding or rigidity. No palpable masses. No distention. No CVA tenderness. Musculoskeletal: Full range of motion to all extremities.  Neurologic:  No gross focal neurologic deficits are appreciated.  Cranial nerves II through XII are intact.  Negative Romberg. Skin:   No rash noted Other:   ED Results / Procedures / Treatments   Labs (all labs ordered are listed, but only abnormal results are displayed) Labs Reviewed  RESP PANEL BY RT-PCR (RSV, FLU A&B, COVID)  RVPGX2 - Abnormal; Notable for the following components:      Result Value   SARS Coronavirus 2 by RT PCR POSITIVE (*)    All other components within normal limits  CBC WITH DIFFERENTIAL/PLATELET - Abnormal; Notable for the following components:   RBC 5.15 (*)    MCV 79.4 (*)    MCH 25.8 (*)    All  other components within normal limits  COMPREHENSIVE METABOLIC PANEL - Abnormal; Notable for the following components:   Glucose, Bld 108 (*)    Creatinine, Ser 1.03 (*)    GFR, Estimated 59 (*)    All other components within normal limits  TROPONIN I (HIGH SENSITIVITY)  TROPONIN I (HIGH SENSITIVITY)     EKG  Normal sinus rhythm without ST segment elevation or other apparent arrhythmia.   RADIOLOGY  I personally viewed and evaluated these images as part of my medical decision making, as well as reviewing the written report by the radiologist.  ED Provider Interpretation: No acute abnormality on chest x-ray   PROCEDURES:  Critical Care performed: No  ..Laceration Repair  Date/Time: 07/07/2022 6:58 PM  Performed by: Lannie Fields, PA-C Authorized by: Lannie Fields, PA-C   Consent:    Consent obtained:   Verbal   Risks discussed:  Infection and pain Universal protocol:    Procedure explained and questions answered to patient or proxy's satisfaction: yes   Anesthesia:    Anesthesia method:  Local infiltration   Local anesthetic:  Lidocaine 1% w/o epi Laceration details:    Location:  Scalp   Length (cm):  4   Depth (mm):  5 Pre-procedure details:    Preparation:  Patient was prepped and draped in usual sterile fashion Exploration:    Limited defect created (wound extended): no     Imaging outcome: foreign body not noted     Contaminated: no   Treatment:    Area cleansed with:  Povidone-iodine   Amount of cleaning:  Standard   Irrigation solution:  Sterile saline   Debridement:  None   Scar revision: no   Skin repair:    Repair method:  Staples   Number of staples:  5 Repair type:    Repair type:  Simple Post-procedure details:    Dressing:  Non-adherent dressing    MEDICATIONS ORDERED IN ED: Medications  lidocaine (XYLOCAINE) 1 % (with pres) injection 10 mL (10 mLs Infiltration Given by Other 07/07/22 1842)  acetaminophen (TYLENOL) tablet 650 mg (650 mg Oral Given 07/07/22 1841)  Tdap (BOOSTRIX) injection 0.5 mL (0.5 mLs Intramuscular Given 07/07/22 1853)     IMPRESSION / MDM / [place] / ED COURSE  I reviewed the triage vital signs and the nursing notes.                              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  Patient was hypertensive at triage.  On exam, patient was alert and nontoxic-appearing with no neurodeficits noted.  She did have an occipital scalp laceration that was well-approximated and had no active bleeding at the time.  CBC and CMP largely reassuring.  Patient was COVID-19 positive.  Chest x-ray unremarkable.  No acute abnormality on CTs of the head and cervical spine.  Troponin within range.  EKG indicated normal sinus  rhythm without ST segment elevation or other apparent arrhythmia.  Laceration repair occurred in the emergency department without complication.  Supportive medications were prescribed for COVID-19 and patient was started on Keflex for laceration.  Her tetanus status was updated prior to discharge.  Return precautions were given to return with new or worsening symptoms.     FINAL CLINICAL IMPRESSION(S) / ED DIAGNOSES   Final diagnoses:  [id number]  Rx / DC Orders   ED Discharge Orders          Ordered    ondansetron (ZOFRAN-ODT) 4 MG disintegrating tablet  Every 8 hours PRN        07/07/22 1846    fluticasone (FLONASE) 50 MCG/ACT nasal spray  Daily        07/07/22 1846    albuterol (VENTOLIN HFA) 108 (90 Base) MCG/ACT inhaler  Every 6 hours PRN        07/07/22 1846    cephALEXin (KEFLEX) 500 MG capsule  3 times daily        07/07/22 1857             Note:  This document was prepared using Dragon voice recognition software and may include unintentional dictation errors.   Vallarie Mare [place], PA-C 07/07/22 1859    Lavonia Drafts, MD 07/07/22 [phone]

## 2022-07-07 NOTE — ED Notes (Addendum)
Per EDP request put 10cc of saline in pt hair to help expose head wound.

## 2022-07-07 NOTE — ED Notes (Signed)
Pt going to Xray

## 2022-08-16 ENCOUNTER — Ambulatory Visit (INDEPENDENT_AMBULATORY_CARE_PROVIDER_SITE_OTHER): Payer: Medicare Other | Admitting: Obstetrics & Gynecology

## 2022-08-16 ENCOUNTER — Ambulatory Visit (HOSPITAL_BASED_OUTPATIENT_CLINIC_OR_DEPARTMENT_OTHER)
Admission: RE | Admit: 2022-08-16 | Discharge: 2022-08-16 | Disposition: A | Discharge status: Home / Self Care | Payer: Medicare Other | Source: Ambulatory Visit | Attending: Obstetrics & Gynecology | Admitting: Obstetrics & Gynecology

## 2022-08-16 ENCOUNTER — Encounter (HOSPITAL_BASED_OUTPATIENT_CLINIC_OR_DEPARTMENT_OTHER): Payer: Self-pay | Admitting: Obstetrics & Gynecology

## 2022-08-16 VITALS — BP 144/80 | HR 70 | Ht 65.0 in | Wt 237.0 lb

## 2022-08-16 DIAGNOSIS — C189 Malignant neoplasm of colon, unspecified: Secondary | ICD-10-CM | POA: Diagnosis not present

## 2022-08-16 DIAGNOSIS — Z95820 Peripheral vascular angioplasty status with implants and grafts: Secondary | ICD-10-CM

## 2022-08-16 DIAGNOSIS — I739 Peripheral vascular disease, unspecified: Secondary | ICD-10-CM

## 2022-08-16 DIAGNOSIS — E65 Localized adiposity: Secondary | ICD-10-CM

## 2022-08-16 DIAGNOSIS — Z1231 Encounter for screening mammogram for malignant neoplasm of breast: Secondary | ICD-10-CM | POA: Insufficient documentation

## 2022-08-16 DIAGNOSIS — Z01419 Encounter for gynecological examination (general) (routine) without abnormal findings: Secondary | ICD-10-CM | POA: Diagnosis not present

## 2022-08-16 DIAGNOSIS — I10 Essential (primary) hypertension: Secondary | ICD-10-CM | POA: Diagnosis not present

## 2022-08-16 DIAGNOSIS — R7303 Prediabetes: Secondary | ICD-10-CM

## 2022-08-16 NOTE — Patient Instructions (Addendum)
Pneumonia vaccine-- prevnar 20  Home douche: 1 tsp baking soda dissolved in warm water  OR hydrogen peroxide mixed 1:1 with water  OR use boric acid 650m suppositories vaginally x 5 nights

## 2022-08-16 NOTE — Progress Notes (Signed)
67 y.o. G1P1 Single Black or [place] American female here for breast and pelvic exam.  I am also following her for gyn exam.  Having some vaginal odor.  Came in a did a self swab.  This was negative.  Boric acid recommended.  Denies vaginal bleeding.  Douching not recommended unless with baking soda and water.  Instructions provided.    H/o colon cancer, stage 1, with high grade features.  Did not need chemo.  Has follow up colonoscopy scheduled in March and CEA will be drawn.    Denies vaginal bleeding.  Has lost weight this last year.  Has pannus and would like consult to discuss possible surgical correction.  Patient's last menstrual period was 07/09/2007 (approximate).          Sexually active: No.  H/O STD:  no  Health Maintenance: PCP:  Dr. Peterson Lombard.  Last appt was 07/2021.  Blood work done 04/2022 Vaccines are up to date:  reviewed.  Pt is not sure about  Colonoscopy:  06/11/2021 MMG:  09/14/2020 Negative BMD:  02/24/2018 Negative Last pap smear:  08/08/2021 Normal.   H/o abnormal pap smear:  no    reports that she has never smoked. She has never used smokeless tobacco. She reports that she does not drink alcohol and does not use drugs.  Past Medical History:  Diagnosis Date   Genital warts    Hyperlipidemia    Hypertension    Myocardial infarction [facility])    Peripheral arterial disease ([place])    Status post angioplasty with stent    Known femoral stent occlusion    Past Surgical History:  Procedure Laterality Date   ABDOMINAL AORTAGRAM N/A 08/09/2011   Procedure: ABDOMINAL Maxcine Ham;  Surgeon: Elam Dutch, MD;  Location[facility] CATH LAB;  Service: Cardiovascular;  Laterality: N/A;   ANGIOPLASTY / STENTING FEMORAL  2008   Right femoral artery stenting done in [place]   COLONOSCOPY WITH PROPOFOL N/A 06/11/2021   Procedure: COLONOSCOPY WITH PROPOFOL;  Surgeon: [name], MD;  Location: [facility] ENDOSCOPY;  Service: Gastroenterology;  Laterality: N/A;  2ND ARRIVAL, PLEASE    FEMORAL BYPASS Right 08/2011   Dr. Tobie Poet at [facility]    Current Outpatient Medications  Medication Sig Dispense Refill   aspirin 81 MG EC tablet Take 1 tablet by mouth daily.     betamethasone dipropionate 0.05 % cream Apply topically 2 (two) times daily. Do not use for more than 10 days 30 g 0   clopidogrel (PLAVIX) 75 MG tablet Take 1 tablet (75 mg total) by mouth daily.     Magnesium 100 MG CAPS Take 1 mg by mouth 1 day or 1 dose.     rosuvastatin (CRESTOR) 20 MG tablet Take 20 mg by mouth at bedtime.     triamcinolone ointment (KENALOG) 0.1 %      valsartan-hydrochlorothiazide (DIOVAN-HCT) 160-25 MG tablet Take by mouth.     fluticasone (FLONASE) 50 MCG/ACT nasal spray Place 1 spray into both nostrils daily for 7 days. 9.9 mL 2   No current facility-administered medications for this visit.    Family History  Problem Relation Age of Onset   Uterine cancer Mother    Diabetes Sister    Hypertension Sister    Breast cancer Maternal Aunt    Stroke Maternal Uncle     Review of Systems  Constitutional: Negative.   Genitourinary: Negative.     Exam:   BP (!) 144/80   Pulse 70   Ht 5' 5"$  (1.651  m) Comment: Reported  Wt 237 lb (107.5 kg) Comment: Reported  LMP 07/09/2007 (Approximate)   BMI 39.44 kg/m   Height: 5' 5"$  (165.1 cm) (Reported)  General appearance: alert, cooperative and appears stated age Breasts: normal appearance, no masses or tenderness Abdomen: soft, non-tender; bowel sounds normal; no masses,  no organomegaly Lymph nodes: Cervical, supraclavicular, and axillary nodes normal.  No abnormal inguinal nodes palpated Neurologic: Grossly normal  Pelvic: External genitalia:  no lesions              Urethra:  normal appearing urethra with no masses, tenderness or lesions              Bartholins and Skenes: normal                 Vagina: normal appearing vagina with atrophic changes and no discharge, no lesions              Cervix: no lesions              Pap taken:  No. Bimanual Exam:  Uterus:  normal size, contour, position, consistency, mobility, non-tender              Adnexa: normal adnexa and no mass, fullness, tenderness               Rectovaginal: Confirms               Anus:  normal sphincter tone, no lesions  Chaperone, Octaviano Batty, CMA, was present for exam.  Assessment/Plan: There are no diagnoses linked to this encounter.

## 2022-08-26 ENCOUNTER — Encounter: Payer: Self-pay | Admitting: *Deleted

## 2022-10-07 IMAGING — CT CT ABD-PELV W/ CM
2 of 5 series · 16 of 46 positions shown, 18 images · IV contrast (APPLIED)
Comparison: 09/11/2009.

CLINICAL DATA: Colon polyps.

EXAM:
CT ABDOMEN AND PELVIS WITH CONTRAST
TECHNIQUE: Multidetector CT imaging of the abdomen and pelvis was performed
using the standard protocol following bolus administration of
intravenous contrast.
CONTRAST:  100mL OMNIPAQUE IOHEXOL 300 MG/ML  SOLN

[Series 2: routine abd/pel with · axial · 0.98mm/px · z∈[-840,-390]mm · 13 of 102 slices shown, 15 images]
[im 6/102  soft-tissue]
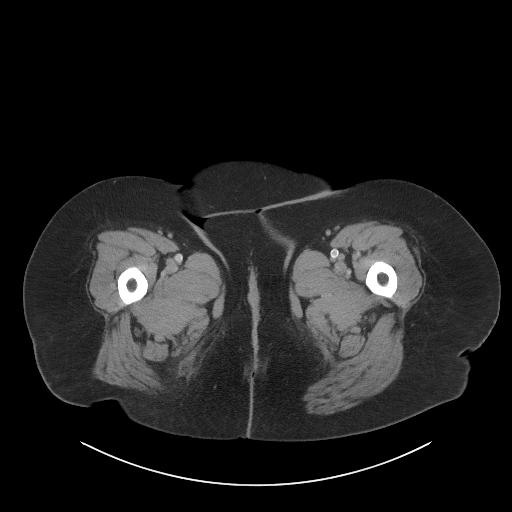
[im 6/102  bone]
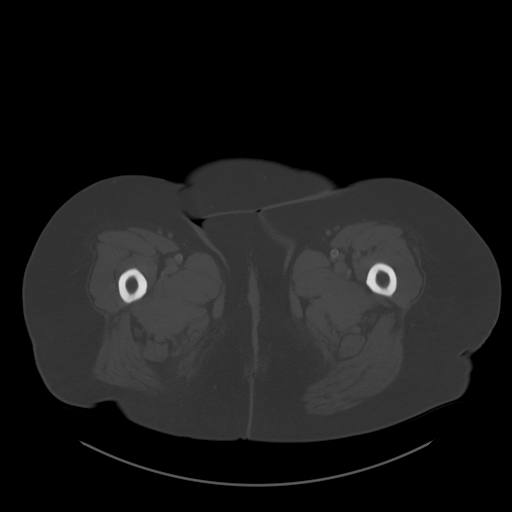
[im 16/102  soft-tissue]
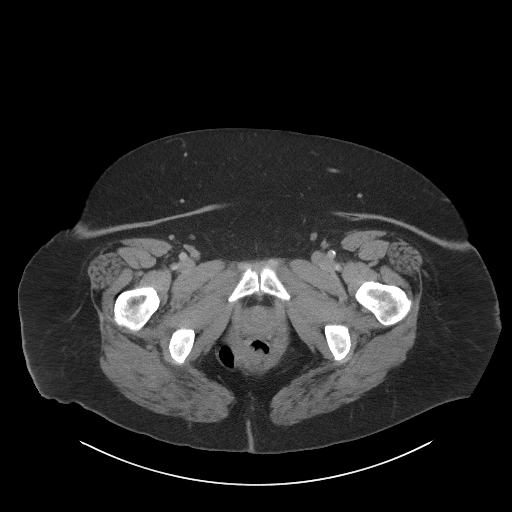
[im 21/102  soft-tissue]
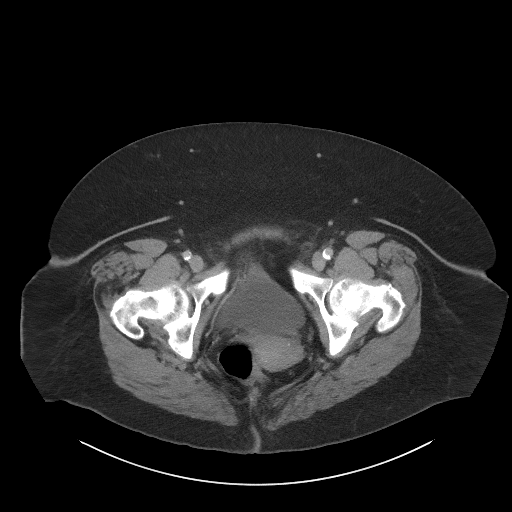
[im 31/102  soft-tissue]
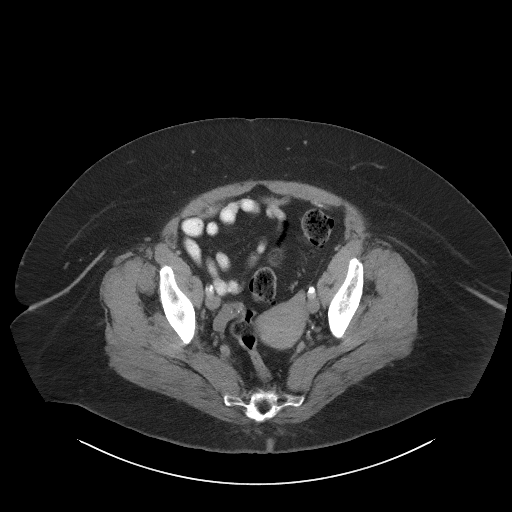
[im 36/102  soft-tissue]
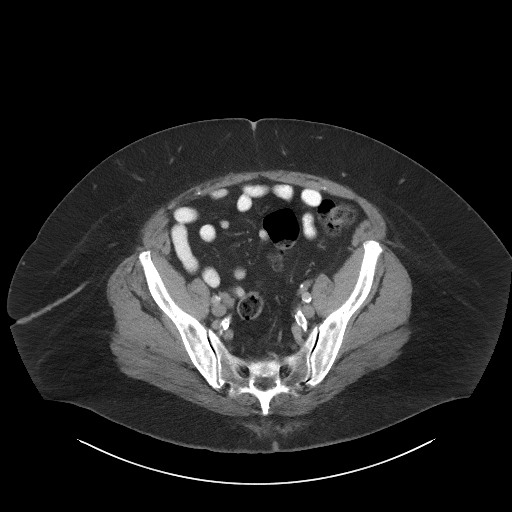
[im 46/102  soft-tissue]
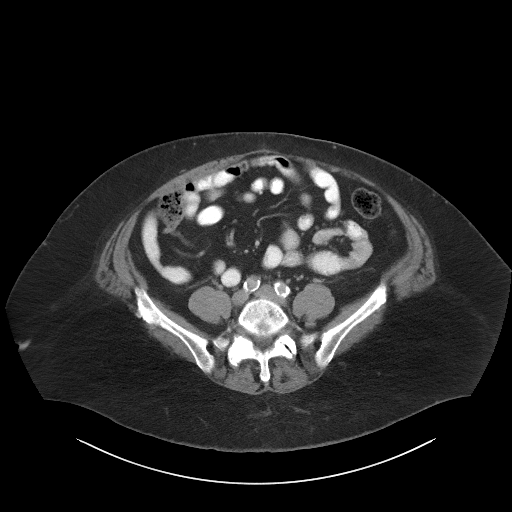
[im 51/102  soft-tissue]
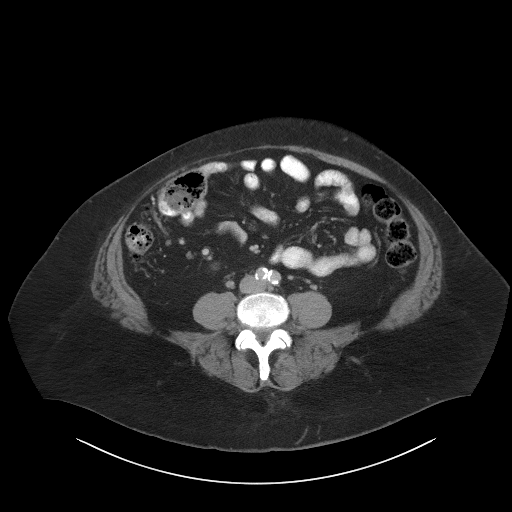
[im 56/102  soft-tissue]
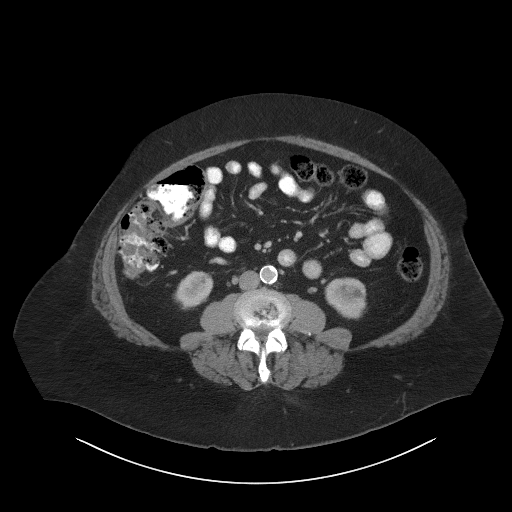
[im 66/102  soft-tissue]
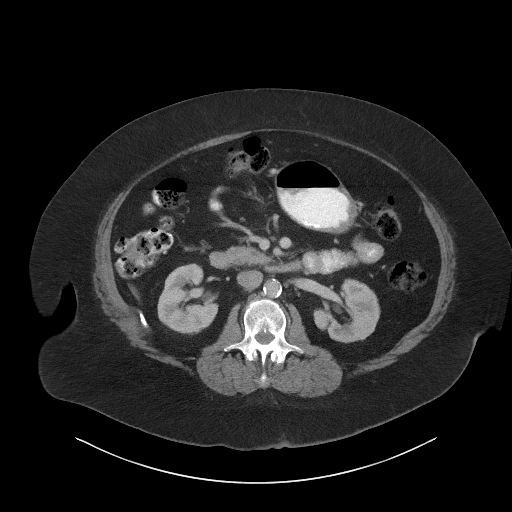
[im 66/102  bone]
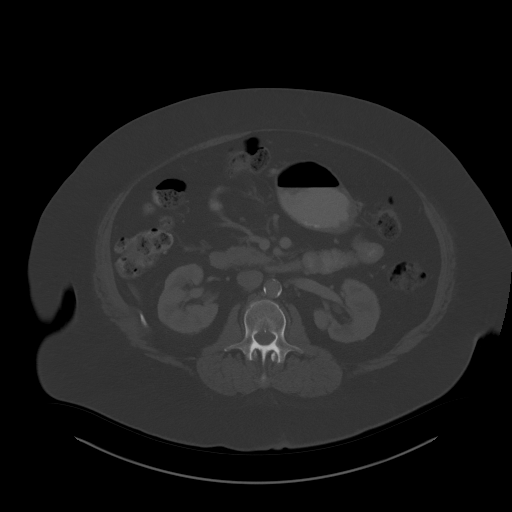
[im 71/102  soft-tissue]
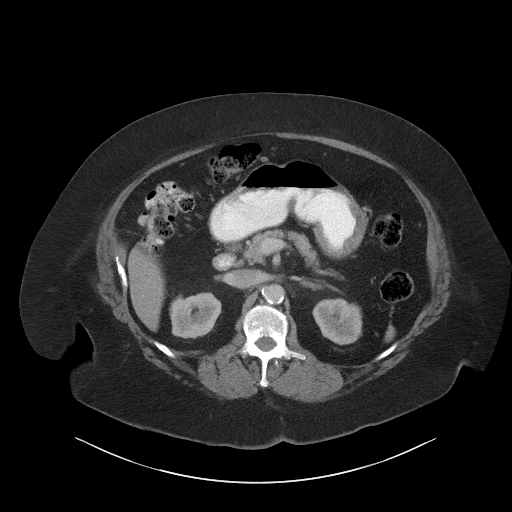
[im 81/102  soft-tissue]
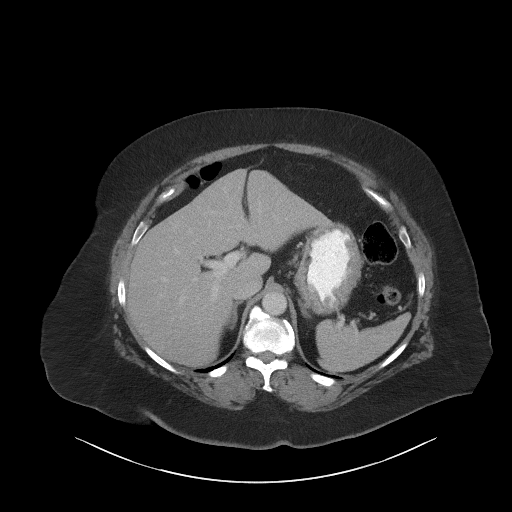
[im 86/102  soft-tissue]
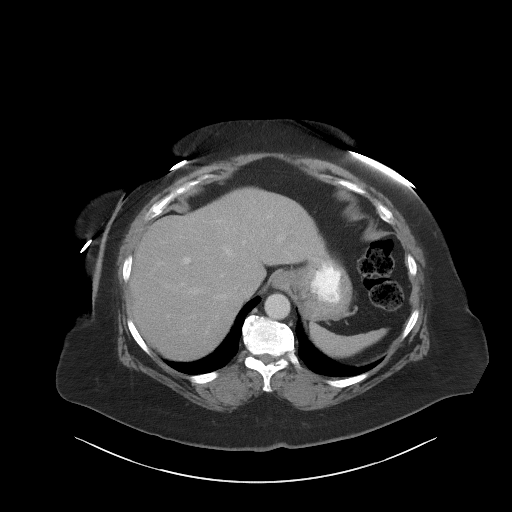
[im 96/102  soft-tissue]
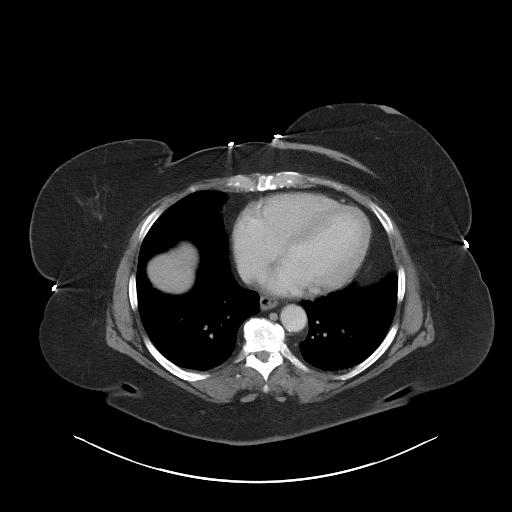

[Series 4: coronal st · coronal · 0.80mm/px · 3 of 115 slices shown]
[im 39/115  soft-tissue]
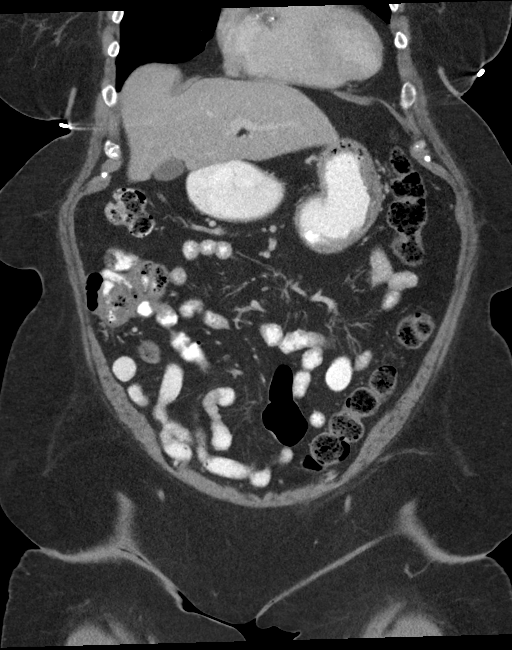
[im 51/115  soft-tissue]
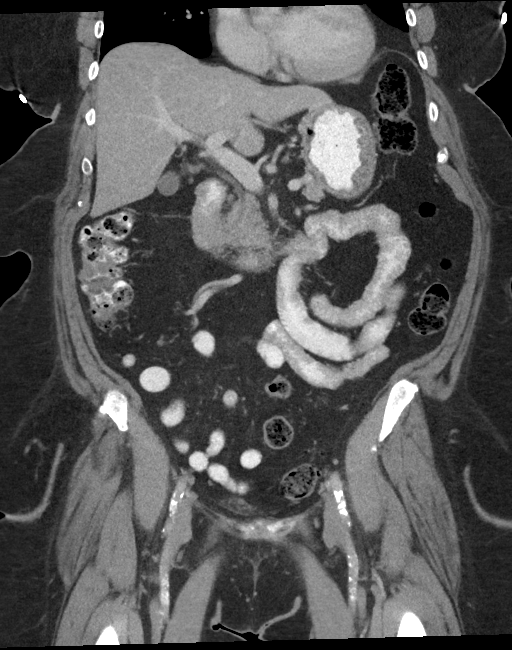
[im 64/115  soft-tissue]
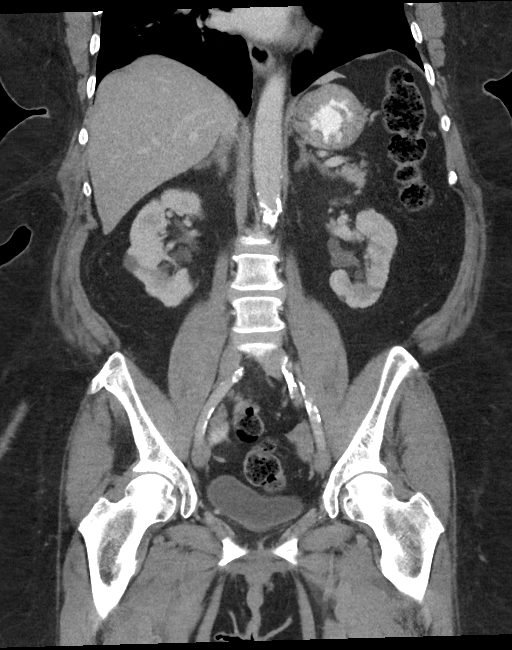

[16 of 46 positions shown; findings below may reference images not displayed]

FINDINGS: Lower chest: Minimal dependent atelectasis bilaterally. Heart is
enlarged. Atherosclerotic calcification of the aorta, aortic valve
and coronary arteries. No pericardial or pleural effusion. Distal
esophagus is unremarkable.

Hepatobiliary: Liver and gallbladder are unremarkable. No biliary
ductal dilatation.

Pancreas: Negative.

Spleen: Negative.

Adrenals/Urinary Tract: Adrenal glands are unremarkable. Scarring in
the kidneys bilaterally. 1.7 cm interpolar right renal cyst. Other
low-attenuation lesions in the kidneys are too small to
characterize. Ureters are decompressed. Bladder is grossly
unremarkable.

Stomach/Bowel: Stomach, small bowel, appendix and colon are
unremarkable.

Vascular/Lymphatic: Atherosclerotic calcification of the aorta. No
pathologically enlarged lymph nodes.

Reproductive: Uterus is visualized.  No adnexal mass.

Other: Tiny bilateral inguinal hernias contain fat. No free fluid.
Mesenteries and peritoneum are unremarkable.

Musculoskeletal: Degenerative changes in the spine. No worrisome
lytic or sclerotic lesions.
IMPRESSION: 1. No evidence of primary malignancy or metastatic disease.
2. Aortic atherosclerosis ([id number]). Coronary artery
calcification.

## 2022-11-22 ENCOUNTER — Institutional Professional Consult (permissible substitution): Payer: Medicare Other | Admitting: Plastic Surgery

## 2023-02-21 ENCOUNTER — Institutional Professional Consult (permissible substitution): Payer: Medicare Other | Admitting: Plastic Surgery

## 2023-03-07 ENCOUNTER — Institutional Professional Consult (permissible substitution): Payer: BC Managed Care – PPO | Admitting: Plastic Surgery

## 2023-09-01 ENCOUNTER — Ambulatory Visit (HOSPITAL_BASED_OUTPATIENT_CLINIC_OR_DEPARTMENT_OTHER): Payer: Medicare Other | Admitting: Obstetrics & Gynecology

## 2023-09-10 ENCOUNTER — Ambulatory Visit (HOSPITAL_BASED_OUTPATIENT_CLINIC_OR_DEPARTMENT_OTHER): Payer: Medicare Other | Admitting: Obstetrics & Gynecology

## 2023-12-12 ENCOUNTER — Ambulatory Visit (HOSPITAL_BASED_OUTPATIENT_CLINIC_OR_DEPARTMENT_OTHER): Admitting: Obstetrics & Gynecology

## 2023-12-12 ENCOUNTER — Other Ambulatory Visit (HOSPITAL_BASED_OUTPATIENT_CLINIC_OR_DEPARTMENT_OTHER): Payer: Self-pay

## 2023-12-12 ENCOUNTER — Encounter (HOSPITAL_BASED_OUTPATIENT_CLINIC_OR_DEPARTMENT_OTHER): Payer: Self-pay | Admitting: Obstetrics & Gynecology

## 2023-12-12 ENCOUNTER — Other Ambulatory Visit (HOSPITAL_COMMUNITY)
Admission: RE | Admit: 2023-12-12 | Discharge: 2023-12-12 | Disposition: A | Discharge status: Home / Self Care | Source: Ambulatory Visit | Attending: Obstetrics & Gynecology | Admitting: Obstetrics & Gynecology

## 2023-12-12 VITALS — BP 138/86 | HR 78 | Ht 65.0 in | Wt 223.0 lb

## 2023-12-12 DIAGNOSIS — Z124 Encounter for screening for malignant neoplasm of cervix: Secondary | ICD-10-CM | POA: Insufficient documentation

## 2023-12-12 DIAGNOSIS — M25552 Pain in left hip: Secondary | ICD-10-CM

## 2023-12-12 DIAGNOSIS — Z1231 Encounter for screening mammogram for malignant neoplasm of breast: Secondary | ICD-10-CM

## 2023-12-12 DIAGNOSIS — E2839 Other primary ovarian failure: Secondary | ICD-10-CM

## 2023-12-12 DIAGNOSIS — Z01419 Encounter for gynecological examination (general) (routine) without abnormal findings: Secondary | ICD-10-CM | POA: Diagnosis not present

## 2023-12-12 MED ORDER — TIRZEPATIDE-WEIGHT MANAGEMENT 15 MG/0.5ML ~~LOC~~ SOAJ
15.0000 mg | SUBCUTANEOUS | 2 refills | Status: DC
  Filled 2023-12-12: qty 2, 28d supply, fill #0
  Filled 2024-01-06: qty 2, 28d supply, fill #1
  Filled 2024-02-19 – 2024-02-23 (×2): qty 2, 28d supply, fill #2

## 2023-12-12 NOTE — Patient Instructions (Signed)
 Prevnar vaccine  Call [phone] to schedule an appointment at [facility] radiology for your mammogram and bone density.

## 2023-12-12 NOTE — Progress Notes (Signed)
 ANNUAL EXAM Patient name: Debbie [name] MRN [id number]  Date of birth: 04-Aug-1955 Chief Complaint:   Annual Exam  History of Present Illness:   Debbie [name] is a 68 y.o. G1P1 African-American female being seen today for wellness gyn exam.  Has lost weight since I saw her last.  Down a little less than 30 pounds.  Still having issues with her left hip.  Had surgery scheduled for bursa removal 01/2022.  She decided to cancel her surgery.  Really doesn't want to have surgery.  Would like other conservative, non surgical options.  Discussed consult with Sports Medicine as she feels all she's being told to do is have surgery.  She understands this is outside my expertise area.  Denies vaginal bleeding.    Patient's last menstrual period was 07/09/2007 (approximate).    Last pap 08/08/2021. Results were: negative. H/O abnormal pap: no Last mammogram: 08/16/2022. Results were: normal. Family h/o breast cancer: no Last colonoscopy:12/04/2022. Results were: normal.  DEXA:  2019     08/16/2022    9:07 AM 08/08/2021    2:37 PM  Depression screen PHQ 2/9  Decreased Interest 0 0  Down, Depressed, Hopeless 0 0  PHQ - 2 Score 0 0      Review of Systems:   Pertinent items are noted in HPI  Denies any bowel or bladder changes. Pertinent History Reviewed:  Reviewed past medical,surgical, social and family history.  Reviewed problem list, medications and allergies. Physical Assessment:   Vitals:   12/12/23 1002  BP: 138/86  Pulse: 78  Weight: 223 lb (101.2 kg)  Height: 5\' 5"  (1.651 m)  Body mass index is 37.11 kg/m.        Physical Examination:   General appearance - well appearing, and in no distress  Mental status - alert, oriented to person, place, and time  Psych:  She has a normal mood and affect  Skin - warm and dry, normal color, no suspicious lesions noted  Chest - effort normal, all lung fields clear to auscultation bilaterally  Heart - normal rate and regular rhythm  Neck:   midline trachea, no thyromegaly or nodules  Breasts - breasts appear normal, no suspicious masses, no skin or nipple changes or axillary nodes  Abdomen - soft, nontender, nondistended, no masses or organomegaly  Pelvic - VULVA: normal appearing vulva with no masses, tenderness or lesions   VAGINA: normal appearing vagina with normal color and discharge, no lesions   CERVIX: normal appearing cervix without discharge or lesions, no CMT  Thin prep pap is done   UTERUS: uterus is felt to be normal size, shape, consistency and nontender   ADNEXA: No adnexal masses or tenderness noted.  Rectal - normal rectal, good sphincter tone, no masses felt.   Extremities:  No swelling or varicosities noted  Chaperone present for exam  No results found for this or any previous visit (from the past 24 hours).  Assessment & Plan:  1. Well woman exam with routine gynecological exam (Primary) - Pap smear updated today - Mammogram 08/16/2022 - Colonoscopy 12/04/2022 - Bone mineral density 2019.  Ordered today to update. - lab work done with PCP, Dr. Derrick Fling - vaccines reviewed/updated  2. Hypoestrogenism - DG BONE DENSITY (DXA); Future  3. Encounter for screening mammogram for malignant neoplasm of breast - MM 3D SCREENING MAMMOGRAM BILATERAL BREAST; Future  4. Cervical cancer screening - Cytology - PAP( [place])  5. Left hip pain - AMB referral to sports medicine  Orders Placed This Encounter  Procedures   DG BONE DENSITY (DXA)   MM 3D SCREENING MAMMOGRAM BILATERAL BREAST   AMB referral to sports medicine    Meds: No orders of the defined types were placed in this encounter.   Follow-up: Return in about 1 year (around 12/11/2024).  Lillian Rein, MD 12/15/2023 10:53 PM

## 2023-12-15 ENCOUNTER — Ambulatory Visit (HOSPITAL_BASED_OUTPATIENT_CLINIC_OR_DEPARTMENT_OTHER): Payer: Self-pay | Admitting: Obstetrics & Gynecology

## 2023-12-15 LAB — CYTOLOGY - PAP: Diagnosis: NEGATIVE

## 2023-12-16 NOTE — Progress Notes (Deleted)
   Joanna Muck, PhD, LAT, ATC acting as a scribe for Garlan Juniper, MD.  Shaelyn Decarli is a 68 y.o. female who presents to [facility] at [facility] today for L hip pain x ***. Pt locates pain to ***  Radiates: Aggravates: Treatments tried:  Dx imaging: 09/03/21 L hip MRI  Pertinent review of systems: ***  Relevant historical information: ***   Exam:  LMP 07/09/2007 (Approximate)  General: Well Developed, well nourished, and in no acute distress.   MSK: ***    Lab and Radiology Results No results found for this or any previous visit (from the past 72 hours). No results found.     Assessment and Plan: 68 y.o. female with ***   PDMP not reviewed this encounter. No orders of the defined types were placed in this encounter.  No orders of the defined types were placed in this encounter.    Discussed warning signs or symptoms. Please see discharge instructions. Patient expresses understanding.   ***

## 2023-12-17 ENCOUNTER — Ambulatory Visit: Admitting: Family Medicine

## 2023-12-23 ENCOUNTER — Ambulatory Visit: Admitting: Family Medicine

## 2023-12-23 ENCOUNTER — Other Ambulatory Visit: Payer: Self-pay

## 2023-12-23 ENCOUNTER — Encounter: Payer: Self-pay | Admitting: Family Medicine

## 2023-12-23 ENCOUNTER — Ambulatory Visit (INDEPENDENT_AMBULATORY_CARE_PROVIDER_SITE_OTHER)

## 2023-12-23 VITALS — BP 122/72 | HR 73 | Ht 65.0 in | Wt 222.0 lb

## 2023-12-23 DIAGNOSIS — M25552 Pain in left hip: Secondary | ICD-10-CM | POA: Diagnosis not present

## 2023-12-23 DIAGNOSIS — G8929 Other chronic pain: Secondary | ICD-10-CM

## 2023-12-23 DIAGNOSIS — M545 Low back pain, unspecified: Secondary | ICD-10-CM

## 2023-12-23 MED ORDER — PREDNISONE 50 MG PO TABS: 50.0000 mg | ORAL_TABLET | Freq: Every day | ORAL | 0 refills | Status: DC

## 2023-12-23 NOTE — Patient Instructions (Addendum)
 Thank you for coming in today.   Please get an Xray today before you leave   We are ordering an MRI for you today.  The imaging office will be calling you to schedule your appointment after we obtain authorization from your insurance company.  If you have not heard from [facility] within a week, please call [phone] to schedule your MRI.   Please be sure you have signed up for MyChart so that we can get your results to you.  We will be in touch with you as soon as we can.  Please know, it can take up to 3-4 business days for the radiologist and Dr. Alease Hunter to have time to review the results and determine the best plan of care.  If there is something that appears to be surgical or needs a referral to other specialists we will let you know through MyChart or telephone.  Otherwise we will plan to schedule a follow up appointment with Dr. Alease Hunter once we have the results.    A referral for physical therapy has been submitted. A representative from the physical therapy office will contact you to coordinate scheduling after confirming your benefits with your insurance provider. If you do not hear from the physical therapy office within the next 1-2 weeks, please let us  know.   See me back after your trip in July.

## 2023-12-23 NOTE — Progress Notes (Signed)
 I, Miquel Amen, CMA acting as a scribe for Garlan Juniper, MD.  Debbie [name] is a 68 y.o. female who presents to [facility] at [facility] today for L hip pain x 6 years, exacerbated over the past few days. Pt locates pain to the lower back radiating into the lateral hip, gluteal region, and upper leg. Sx wax and wane. Denies n/t/w in the L LE. Short-term relief with Tramadol. Notes palpable nodules on the hip/gluteal region. Has done well with steroid injection for sx in the past. Sx worse in the mornings.   She has had extensive physical therapy with a physical therapist at [facility] [employer] which has not helped.  In the past over the years she has had multiple injections which sometimes help and sometimes do not.  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  Radiates: lower back, gluteal region, upper leg Aggravates: standing on one leg, laying in bed, turning over Treatments tried: Tylenol , Tramadol  Dx imaging: 09/03/21 L hip MRI  Pertinent review of systems: No fevers or chills  Relevant historical information: Peripheral arterial disease and history of colon cancer.   Exam:  BP 122/72   Pulse 73   Ht 5' 5 (1.651 m)   Wt 222 lb (100.7 kg)   LMP 07/09/2007 (Approximate)   SpO2 97%   BMI 36.94 kg/m  General: Well Developed, well nourished, and in no acute distress.   MSK: Left hip normal-appearing Decreased range of motion. Tender palpation greater trochanter.  Hip abduction strength is significantly diminished 3+/5.  External rotation strength is diminished 4/5. Mild antalgic gait.    Lab and Radiology Results  X-ray images left hip and lumbar spine obtained today personally and independently interpreted.  Left hip: Mild hip osteoarthritis.  No acute fractures.  No aggressive appearing bony lesions are visible.  Lumbar spine: Multilevel DDD.  No acute fractures are visible.  Aortic atherosclerosis is present.  Await formal  radiology review    Assessment and Plan: 68 y.o. female with left lateral hip pain and weakness with hip adduction.  This is a chronic ongoing problem over the years.  She has had multiple trials of physical therapy which have been somewhat mediocre.  She has had multiple different greater trochanter injections which have been somewhat mediocre as well.  She is just finishing up a greater than 6-week trial of physical therapy and still having pain.  We discussed her options and we will proceed with updated x-rays and MRIs.  Will try physical therapy in a different location.  I prescribed prednisone as an emergency backup plan to use for this upcoming wedding if needed.  Anticipate recheck early July right before she goes on this trip for the wedding so that we can do an injection then if needed or at least review the MRI.   PDMP not reviewed this encounter. Orders Placed This Encounter  Procedures   DG HIP UNILAT W OR W/O PELVIS 2-3 VIEWS LEFT    Standing Status:   Future    Number of Occurrences:   1    Expiration Date:   01/22/2024    Reason for Exam (SYMPTOM  OR DIAGNOSIS REQUIRED):   left hip pain    Preferred imaging location?:   [facility]   DG Lumbar Spine 2-3 Views    Standing Status:   Future    Number of Occurrences:   1    Expiration Date:   01/22/2024  Reason for Exam (SYMPTOM  OR DIAGNOSIS REQUIRED):   low back pain    Preferred imaging location?:   [facility]   MR HIP LEFT WO CONTRAST    Standing Status:   Future    Expiration Date:   01/22/2024    What is the patient's sedation requirement?:   No Sedation    Does the patient have a pacemaker or implanted devices?:   No    Preferred imaging location?:   [facility] (table limit-350lbs)   Ambulatory referral to Physical Therapy    Referral Priority:   Routine    Referral Type:   Physical Medicine    Referral Reason:   Specialty Services Required    Requested Specialty:   Physical Therapy     Number of Visits Requested:   1   Meds ordered this encounter  Medications   predniSONE (DELTASONE) 50 MG tablet    Sig: Take 1 tablet (50 mg total) by mouth daily. Take close to the time of the wedding if pain is still present.    Dispense:  5 tablet    Refill:  0     Discussed warning signs or symptoms. Please see discharge instructions. Patient expresses understanding.   The above documentation has been reviewed and is accurate and complete Garlan Juniper, M.D.

## 2023-12-24 ENCOUNTER — Ambulatory Visit: Payer: Self-pay | Admitting: Family Medicine

## 2023-12-24 DIAGNOSIS — G8929 Other chronic pain: Secondary | ICD-10-CM

## 2023-12-24 NOTE — Progress Notes (Signed)
Lumbar spine x-ray shows some arthritis changes.

## 2023-12-25 ENCOUNTER — Ambulatory Visit (HOSPITAL_BASED_OUTPATIENT_CLINIC_OR_DEPARTMENT_OTHER): Admitting: Obstetrics & Gynecology

## 2023-12-26 NOTE — Telephone Encounter (Signed)
 Forwarding to Dr. Alease Hunter to review and advise pt questions:   Is there any action I need to take for the X-ray results.  Any special exercises.

## 2023-12-27 ENCOUNTER — Ambulatory Visit

## 2023-12-27 DIAGNOSIS — M25552 Pain in left hip: Secondary | ICD-10-CM | POA: Diagnosis not present

## 2023-12-29 NOTE — Addendum Note (Signed)
 Addended by: MARDY LEOTIS RAMAN on: 12/29/2023 01:29 PM   Modules accepted: Orders

## 2023-12-29 NOTE — Telephone Encounter (Signed)
 Dr. Joane, should pt wait to start PT until MRI L-spine has been done?

## 2023-12-29 NOTE — Progress Notes (Signed)
 Left hip MRI looks normal to radiology.  No arthritis.  Tendons and ligaments look okay. This is surprising.  I think we should probably get a lumbar spine MRI to evaluate pinched nerve in your back as a possible source of pain more accurately.  What are your thoughts on getting this MRI?

## 2023-12-29 NOTE — Telephone Encounter (Signed)
Forwarding to Dr. Corey to review.  

## 2024-01-03 ENCOUNTER — Other Ambulatory Visit

## 2024-01-03 ENCOUNTER — Ambulatory Visit

## 2024-01-03 DIAGNOSIS — M545 Low back pain, unspecified: Secondary | ICD-10-CM

## 2024-01-03 DIAGNOSIS — G8929 Other chronic pain: Secondary | ICD-10-CM

## 2024-01-04 ENCOUNTER — Other Ambulatory Visit

## 2024-01-05 ENCOUNTER — Ambulatory Visit: Payer: Self-pay | Admitting: Family Medicine

## 2024-01-05 DIAGNOSIS — M25552 Pain in left hip: Secondary | ICD-10-CM

## 2024-01-05 DIAGNOSIS — M5416 Radiculopathy, lumbar region: Secondary | ICD-10-CM

## 2024-01-05 NOTE — Progress Notes (Signed)
 Lumbar spine MRI shows a pinched nerve at the left L3 nerve.  This could hurt in the hip.  Additionally there is some spinal stenosis further down at L4-5.  I am going to order an epidural steroid injection.  This is an injection in your back that should help the nerve pain.  You should hear soon about scheduling.  Please keep your appoint with me on July 18.

## 2024-01-06 ENCOUNTER — Other Ambulatory Visit (HOSPITAL_BASED_OUTPATIENT_CLINIC_OR_DEPARTMENT_OTHER): Payer: Self-pay

## 2024-01-06 NOTE — Progress Notes (Unsigned)
   LILLETTE Ileana Collet, PhD, LAT, ATC acting as a scribe for Artist Lloyd, MD.  Debbie [name] is a 68 y.o. female who presents to [facility] at [facility] today for f/u low back and L hip pain w/ MRI review. Pt was last seen by Dr. Lloyd on 12/23/23 and was prescribed prednisone  and a MRI was ordered.  Today, pt reports ***  Dx imaging: 01/03/24 L-spine MRI 12/23/23 L hip XR 12/27/23 L hip MRI   12/23/23 L-spine XR  Pertinent review of systems: ***  Relevant historical information: ***   Exam:  LMP 07/09/2007 (Approximate)  General: Well Developed, well nourished, and in no acute distress.   MSK: ***    Lab and Radiology Results No results found for this or any previous visit (from the past 72 hours). MR LUMBAR SPINE WO CONTRAST Result Date: 01/04/2024 EXAM DESCRIPTION: MR LUMBAR SPINE WO CONTRAST CLINICAL HISTORY: Low back pain, symptoms persist with > 6 wks treatment COMPARISON: None Available. TECHNIQUE: MRI of the lumbar spine is performed according to our usual protocol with axial and sagittal multi sequence imaging. FINDINGS: The alignment is unremarkable. Mild degenerative disc disease throughout. No discogenic endplate marrow edema. The marrow signal is unremarkable as well as the conus and cauda equina. T12-L1 has a broad-based disc bulge combined with facet/ligament flavum hypertrophy to cause mild central stenosis. L2-3 has a broad-based disc bulge without stenosis. L3-4 has a left foraminal protrusion combined with facet hypertrophy to cause moderate to severe foraminal narrowing. Contact and suspected impingement of the exiting nerve. This level also has a broad-based disc bulge combined with facet/ligament flavum hypertrophy to cause moderate central stenosis and mild foraminal narrowing on the right. L4-5 has mild bilateral foraminal narrowing and moderate to severe central stenosis due to broad-based disc bulge and facet/ligament flavum hypertrophy. The image  levels are otherwise unremarkable. Benign-appearing renal cysts. The imaged retroperitoneal structures are otherwise unremarkable. IMPRESSION: Multilevel degenerative spondylosis with levels described in detail above. L3-4 has a left foraminal protrusion with suspected impingement of the exiting L3 nerve. Also a moderate degenerative central stenosis. 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 Electronically signed by: Reyes Frees MD 01/04/2024 11:04 AM EDT RP Workstation: [id number]       Assessment and Plan: 68 y.o. female with ***   PDMP not reviewed this encounter. No orders of the defined types were placed in this encounter.  No orders of the defined types were placed in this encounter.    Discussed warning signs or symptoms. Please see discharge instructions. Patient expresses understanding.   ***

## 2024-01-07 ENCOUNTER — Ambulatory Visit: Admitting: Family Medicine

## 2024-01-07 ENCOUNTER — Other Ambulatory Visit (HOSPITAL_BASED_OUTPATIENT_CLINIC_OR_DEPARTMENT_OTHER): Payer: Self-pay

## 2024-01-07 ENCOUNTER — Encounter: Payer: Self-pay | Admitting: Family Medicine

## 2024-01-07 VITALS — BP 150/90 | HR 86 | Ht 65.0 in | Wt 220.0 lb

## 2024-01-07 DIAGNOSIS — M5416 Radiculopathy, lumbar region: Secondary | ICD-10-CM | POA: Insufficient documentation

## 2024-01-07 DIAGNOSIS — M5442 Lumbago with sciatica, left side: Secondary | ICD-10-CM

## 2024-01-07 DIAGNOSIS — M25552 Pain in left hip: Secondary | ICD-10-CM | POA: Diagnosis not present

## 2024-01-07 DIAGNOSIS — G8929 Other chronic pain: Secondary | ICD-10-CM

## 2024-01-07 NOTE — Patient Instructions (Signed)
 Thank you for coming in today.   A referral for physical therapy has been submitted. A representative from the physical therapy office will contact you to coordinate scheduling after confirming your benefits with your insurance provider. If you do not hear from the physical therapy office within the next 1-2 weeks, please let us  know.   Plan to follow up after Sept 15th.  We can plan for injection is symptoms don't improve.

## 2024-01-08 ENCOUNTER — Ambulatory Visit: Admitting: Physical Therapy

## 2024-01-20 ENCOUNTER — Ambulatory Visit: Admitting: Physical Therapy

## 2024-01-22 ENCOUNTER — Telehealth: Payer: Self-pay | Admitting: Family Medicine

## 2024-01-22 DIAGNOSIS — M5416 Radiculopathy, lumbar region: Secondary | ICD-10-CM

## 2024-01-22 NOTE — Telephone Encounter (Signed)
 Patient called stating that she has been having continued and worsening pain. The tylenol  does not seem to helping. She asked if there was something else Dr Joane would recommend? She mentioned maybe a muscle relaxer?  Please advise.

## 2024-01-23 ENCOUNTER — Ambulatory Visit: Admitting: Family Medicine

## 2024-01-23 MED ORDER — TIZANIDINE HCL 2 MG PO TABS: 2.0000 mg | ORAL_TABLET | Freq: Three times a day (TID) | ORAL | 1 refills | Status: DC | PRN

## 2024-01-23 NOTE — Telephone Encounter (Signed)
 I ordered an epidural steroid injection on June 30.  Have you scheduled this yet?Debbie [name]  This would be something that should help your pain.  Additionally have prescribed tizanidine.  The phone number to the radiology office to schedule the injection is [phone]

## 2024-01-26 NOTE — Telephone Encounter (Signed)
 Forwarding to Dr. Joane to review and advise regarding patient message.   Forwarding to scheduling to assist with scheduling and/or wait list.

## 2024-01-26 NOTE — Telephone Encounter (Signed)
 See message in regards to jury duty letter.

## 2024-01-28 NOTE — Telephone Encounter (Signed)
 Patient called stating that the pain in her knee and hip is unbearable.  She said that the tizanidine  does not seem to be helping. Is there something else she can try?

## 2024-01-29 MED ORDER — HYDROCODONE-ACETAMINOPHEN 5-325 MG PO TABS: 1.0000 | ORAL_TABLET | Freq: Four times a day (QID) | ORAL | 0 refills | Status: DC | PRN

## 2024-01-29 NOTE — Addendum Note (Signed)
 Addended by: JOANE ARTIST RAMAN on: 01/29/2024 07:07 AM   Modules accepted: Orders

## 2024-01-29 NOTE — Telephone Encounter (Signed)
 Hydrocodone  prescribed for pain control.  Please keep your appoint with me on the 25th.

## 2024-01-30 ENCOUNTER — Telehealth: Payer: Self-pay | Admitting: Family Medicine

## 2024-01-30 ENCOUNTER — Ambulatory Visit: Admitting: Family Medicine

## 2024-01-30 VITALS — BP 116/72 | HR 81 | Ht 65.0 in

## 2024-01-30 DIAGNOSIS — M5416 Radiculopathy, lumbar region: Secondary | ICD-10-CM | POA: Diagnosis not present

## 2024-01-30 MED ORDER — GABAPENTIN 100 MG PO CAPS: 100.0000 mg | ORAL_CAPSULE | Freq: Three times a day (TID) | ORAL | 3 refills | Status: DC | PRN

## 2024-01-30 NOTE — Progress Notes (Signed)
 LILLETTE Ileana Collet, PhD, LAT, ATC acting as a scribe for Artist Lloyd, MD.  Debbie [name] is a 68 y.o. female who presents to [facility] at [facility] today for f/u L hip pain and lumbar radiculopathy. Pt was last seen by Dr. Lloyd on 01/07/24 and was referred to PT. Based on MRI findings, lumbar ESI was ordered (not yet scheduled)  Pt later called the office c/o unbearable pain in her hip and knee and was prescribed hydrocodone .  Today, pt reports pain comes and goes. Pain is still on the L side of her low back, wrapping around the L hip, and radiating along the anterior thigh. She reports never hearing from [facility] about the Norco rx.  Dx imaging: 01/03/24 L-spine MRI 12/23/23 L hip XR 12/27/23 L hip MRI              12/23/23 L-spine XR  Pertinent review of systems: No fevers or chills  Relevant historical information: History of colon cancer.  Peripheral arterial disease.   Exam:  BP 116/72   Pulse 81   Ht 5' 5 (1.651 m)   LMP 07/09/2007 (Approximate)   SpO2 97%   BMI 36.61 kg/m  General: Well Developed, well nourished, and in no acute distress.   MSK: L-spine: Normal. Nontender palpation spinal midline. Decreased lumbar motion. Lower extremity strength is intact.    Lab and Radiology Results  EXAM DESCRIPTION: MR LUMBAR SPINE WO CONTRAST   CLINICAL HISTORY: Low back pain, symptoms persist with > 6 wks treatment   COMPARISON: None Available.   TECHNIQUE: MRI of the lumbar spine is performed according to our usual protocol with axial and sagittal multi sequence imaging.   FINDINGS: The alignment is unremarkable. Mild degenerative disc disease throughout. No discogenic endplate marrow edema. The marrow signal is unremarkable as well as the conus and cauda equina.   T12-L1 has a broad-based disc bulge combined with facet/ligament flavum hypertrophy to cause mild central stenosis.   L2-3 has a broad-based disc bulge without stenosis.   L3-4 has a  left foraminal protrusion combined with facet hypertrophy to cause moderate to severe foraminal narrowing. Contact and suspected impingement of the exiting nerve. This level also has a broad-based disc bulge combined with facet/ligament flavum hypertrophy to cause moderate central stenosis and mild foraminal narrowing on the right.   L4-5 has mild bilateral foraminal narrowing and moderate to severe central stenosis due to broad-based disc bulge and facet/ligament flavum hypertrophy.   The image levels are otherwise unremarkable.   Benign-appearing renal cysts. The imaged retroperitoneal structures are otherwise unremarkable.     IMPRESSION: Multilevel degenerative spondylosis with levels described in detail above.   L3-4 has a left foraminal protrusion with suspected impingement of the exiting L3 nerve. Also a moderate degenerative central stenosis.   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   Electronically signed by: Reyes Frees MD 01/04/2024 11:04 AM EDT RP Workstation: [id number]   LILLETTE Artist Lloyd, personally (independently) visualized and performed the interpretation of the images attached in this note.      Assessment and Plan: 68 y.o. female with left leg pain due to left L3 lumbar radiculopathy.  Her symptoms have improved since she contacted my office earlier.  Plan to continue PT.  Will use gabapentin as needed.  Can use hydrocodone  as needed.  Neck step if needed would be an epidural steroid injection which already has been ordered.  If that does not work and physical therapy is  not helpful or especially if she has weakness next step should be referral to neurosurgery or spine surgery.   PDMP not reviewed this encounter. No orders of the defined types were placed in this encounter.  Meds ordered this encounter  Medications   gabapentin (NEURONTIN) 100 MG capsule    Sig: Take 1-3 capsules (100-300 mg total) by mouth 3 (three) times daily as  needed.    Dispense:  90 capsule    Refill:  3     Discussed warning signs or symptoms. Please see discharge instructions. Patient expresses understanding.   The above documentation has been reviewed and is accurate and complete Artist Lloyd, M.D.  Total encounter time 30 minutes including face-to-face time with the patient and, reviewing past medical record, and charting on the date of service.

## 2024-01-30 NOTE — Telephone Encounter (Signed)
 Received a call from [facility] asking for diagnosis code related to the hydrocodone  prescription. Gave diagnosis code related to OV note (M54.16). [facility] also wanted to let Dr Joane know that the patient is also taking tizadine.

## 2024-01-30 NOTE — Patient Instructions (Addendum)
 Thank you for coming in today.   I've sent a prescription for Gabapentin to your pharmacy  If the pain going down your leg gets worse, plan to get the epidural steroid injection  Check back in 2 months

## 2024-02-02 NOTE — Telephone Encounter (Signed)
 Noted, Hydrocodone  is to replaced Tizanidine  which was not adequately managing her sx.

## 2024-02-02 NOTE — Telephone Encounter (Signed)
 Confirmation received from [facility].

## 2024-02-18 ENCOUNTER — Emergency Department

## 2024-02-18 ENCOUNTER — Emergency Department
Admission: EM | Admit: 2024-02-18 | Discharge: 2024-02-18 | Disposition: A | Discharge status: Home / Self Care | Attending: Emergency Medicine | Admitting: Emergency Medicine

## 2024-02-18 ENCOUNTER — Other Ambulatory Visit: Payer: Self-pay

## 2024-02-18 DIAGNOSIS — Z95820 Peripheral vascular angioplasty status with implants and grafts: Secondary | ICD-10-CM | POA: Diagnosis not present

## 2024-02-18 DIAGNOSIS — R55 Syncope and collapse: Secondary | ICD-10-CM | POA: Insufficient documentation

## 2024-02-18 DIAGNOSIS — I959 Hypotension, unspecified: Secondary | ICD-10-CM | POA: Diagnosis not present

## 2024-02-18 LAB — COMPREHENSIVE METABOLIC PANEL WITH GFR
ALT: 38 U/L (ref 0–44)
AST: 41 U/L (ref 15–41)
Albumin: 2.9 g/dL — ABNORMAL LOW (ref 3.5–5.0)
Alkaline Phosphatase: 70 U/L (ref 38–126)
Anion gap: 9 (ref 5–15)
BUN: 28 mg/dL — ABNORMAL HIGH (ref 8–23)
CO2: 23 mmol/L (ref 22–32)
Calcium: 8.9 mg/dL (ref 8.9–10.3)
Chloride: 106 mmol/L (ref 98–111)
Creatinine, Ser: 0.69 mg/dL (ref 0.44–1.00)
GFR, Estimated: >60 mL/min (ref >60)
Glucose, Bld: 147 mg/dL — ABNORMAL HIGH (ref 70–99)
Potassium: 3.3 mmol/L — ABNORMAL LOW (ref 3.5–5.1)
Sodium: 138 mmol/L (ref 135–145)
Total Bilirubin: 1.5 mg/dL — ABNORMAL HIGH (ref 0.0–1.2)
Total Protein: 6.4 g/dL — ABNORMAL LOW (ref 6.5–8.1)

## 2024-02-18 LAB — URINALYSIS, W/ REFLEX TO CULTURE (INFECTION SUSPECTED)
Bilirubin Urine: NEGATIVE
Glucose, UA: NEGATIVE mg/dL
Hgb urine dipstick: NEGATIVE
Ketones, ur: NEGATIVE mg/dL
Leukocytes,Ua: NEGATIVE
Nitrite: NEGATIVE
Protein, ur: NEGATIVE mg/dL
RBC / HPF: 0 RBC/hpf (ref 0–5)
Specific Gravity, Urine: 1.012 (ref 1.005–1.030)
pH: 6 (ref 5.0–8.0)

## 2024-02-18 LAB — CBC WITH DIFFERENTIAL/PLATELET
Abs Immature Granulocytes: 0.38 K/uL — ABNORMAL HIGH (ref 0.00–0.07)
Basophils Absolute: 0.1 K/uL (ref 0.0–0.1)
Basophils Relative: 1 %
Eosinophils Absolute: 0.1 K/uL (ref 0.0–0.5)
Eosinophils Relative: 1 %
HCT: 29.6 % — ABNORMAL LOW (ref 36.0–46.0)
Hemoglobin: 9.4 g/dL — ABNORMAL LOW (ref 12.0–15.0)
Immature Granulocytes: 5 %
Lymphocytes Relative: 12 %
Lymphs Abs: 1 K/uL (ref 0.7–4.0)
MCH: 27.9 pg (ref 26.0–34.0)
MCHC: 31.8 g/dL (ref 30.0–36.0)
MCV: 87.8 fL (ref 80.0–100.0)
Monocytes Absolute: 1.3 K/uL — ABNORMAL HIGH (ref 0.1–1.0)
Monocytes Relative: 15 %
Neutro Abs: 5.5 K/uL (ref 1.7–7.7)
Neutrophils Relative %: 66 %
Platelets: 377 K/uL (ref 150–400)
RBC: 3.37 MIL/uL — ABNORMAL LOW (ref 3.87–5.11)
RDW: 17 % — ABNORMAL HIGH (ref 11.5–15.5)
WBC: 8.3 K/uL (ref 4.0–10.5)
nRBC: 0.2 % (ref 0.0–0.2)

## 2024-02-18 LAB — TROPONIN I (HIGH SENSITIVITY)
Troponin I (High Sensitivity): 15 ng/L (ref <18)
Troponin I (High Sensitivity): 15 ng/L (ref <18)

## 2024-02-18 LAB — BRAIN NATRIURETIC PEPTIDE: B Natriuretic Peptide: 59.6 pg/mL (ref 0.0–100.0)

## 2024-02-18 NOTE — ED Provider Notes (Signed)
 [facility] Provider Note   Event Date/Time   First MD Initiated Contact with Patient 02/18/24 1534     (approximate) History  Seizures  HPI Debbie [name] is a 68 y.o. female with recent past medical history of MI status post cardiac arrest and CPR for 10 minutes before ROSC who presents from follow-up at her [facility] primary care's office via EMS after an episode of loss of consciousness.  Staff reported that patient had an loss of consciousness with associated shaking activity as well as bladder incontinence.  No oral trauma.  Questionable postictal period as EMS arrived in 10 minutes and did not note any altered mental status however per the fire department patient did have altered mental status with them immediately after the event.  Patient is at her mental baseline upon arrival and is only complaining of of generalized pain, including chest pain ROS: Patient currently denies any vision changes, tinnitus, difficulty speaking, facial droop, sore throat, shortness of breath, abdominal pain, nausea/vomiting/diarrhea, dysuria, or weakness/numbness/paresthesias in any extremity   Physical Exam  Triage Vital Signs: ED Triage Vitals  Encounter Vitals Group     BP      Girls Systolic BP Percentile      Girls Diastolic BP Percentile      Boys Systolic BP Percentile      Boys Diastolic BP Percentile      Pulse      Resp      Temp      Temp src      SpO2      Weight      Height      Head Circumference      Peak Flow      Pain Score      Pain Loc      Pain Education      Exclude from Growth Chart    Most recent vital signs: Vitals:   02/18/24 1730 02/18/24 1800  BP: 133/77   Pulse: 82   Resp: 17   Temp:  98.4 F (36.9 C)  SpO2: 100%    General: Awake, oriented x4. CV:  Good peripheral perfusion. Resp:  Normal effort. Abd:  No distention. Other:  Obese elderly African-American female resting comfortably in no acute distress ED Results / Procedures /  Treatments  Labs (all labs ordered are listed, but only abnormal results are displayed) Labs Reviewed  COMPREHENSIVE METABOLIC PANEL WITH GFR - Abnormal; Notable for the following components:      Result Value   Potassium 3.3 (*)    Glucose, Bld 147 (*)    BUN 28 (*)    Total Protein 6.4 (*)    Albumin 2.9 (*)    Total Bilirubin 1.5 (*)    All other components within normal limits  CBC WITH DIFFERENTIAL/PLATELET - Abnormal; Notable for the following components:   RBC 3.37 (*)    Hemoglobin 9.4 (*)    HCT 29.6 (*)    RDW 17.0 (*)    Monocytes Absolute 1.3 (*)    Abs Immature Granulocytes 0.38 (*)    All other components within normal limits  URINALYSIS, W/ REFLEX TO CULTURE (INFECTION SUSPECTED) - Abnormal; Notable for the following components:   Color, Urine YELLOW (*)    APPearance CLEAR (*)    Bacteria, UA RARE (*)    All other components within normal limits  BRAIN NATRIURETIC PEPTIDE  TROPONIN I (HIGH SENSITIVITY)  TROPONIN I (HIGH SENSITIVITY)   EKG ED ECG REPORT  I, Artist MARLA Kerns, the attending physician, personally viewed and interpreted this ECG. Date: 02/18/2024 EKG Time: 1540 Rate: 48 Rhythm: Bradycardic sinus rhythm QRS Axis: normal Intervals: normal ST/T Wave abnormalities: normal Narrative Interpretation: Bradycardic sinus rhythm.  No evidence of acute ischemia RADIOLOGY ED MD interpretation: CT of the head without contrast interpreted by me shows no evidence of acute abnormalities including no intracerebral hemorrhage, obvious masses, or significant edema One-view portable chest x-ray interpreted by me shows no evidence of acute abnormalities including no pneumonia, pneumothorax, or widened mediastinum - All radiology independently interpreted and agree with radiology assessment Official radiology report(s): CT Head Wo Contrast Result Date: 02/18/2024 EXAM: CT HEAD WITHOUT CONTRAST 02/18/2024 04:11:47 PM TECHNIQUE: CT of the head was performed without the  administration of intravenous contrast. Automated exposure control, iterative reconstruction, and/or weight based adjustment of the mA/kV was utilized to reduce the radiation dose to as low as reasonably achievable. COMPARISON: CT head 07/07/2022 CLINICAL HISTORY: Seizure, new-onset, no history of trauma. Recent cardiac arrest. FINDINGS: BRAIN AND VENTRICLES: No acute hemorrhage. Gray-white differentiation is preserved. No hydrocephalus. No extra-axial collection. No mass effect or midline shift. Cerebral white matter hypodensities are similar to the prior CT and nonspecific but compatible with mild chronic small vessel ischemic disease. Mild generalized cerebral atrophy is within normal limits for age. ORBITS: No acute abnormality. SINUSES: No acute abnormality. SOFT TISSUES AND SKULL: No acute soft tissue abnormality. No skull fracture. Calcified atherosclerosis at the skull base. IMPRESSION: 1. No acute intracranial abnormality. 2. Mild chronic small vessel ischemic disease. Electronically signed by: Dasie Hamburg MD 02/18/2024 05:30 PM EDT RP Workstation: [id number]   PROCEDURES: Critical Care performed: No Procedures MEDICATIONS ORDERED IN ED: Medications - No data to display IMPRESSION / MDM / ASSESSMENT AND PLAN / ED COURSE  I reviewed the triage vital signs and the nursing notes.                             The patient is on the cardiac monitor to evaluate for evidence of arrhythmia and/or significant heart rate changes. Patient's presentation is most consistent with acute presentation with potential threat to life or bodily function. Patient presents with complaints of syncope/presyncope ED Workup:  CBC, BMP, Troponin, BNP, ECG, CXR Differential diagnosis includes HF, ICH, seizure, stroke, HOCM, ACS, aortic dissection, malignant arrhythmia, or GI bleed. Findings: No evidence of acute laboratory abnormalities.  Troponin negative x1 EKG: No e/o STEMI. No evidence of Brugada's sign, delta  wave, epsilon wave, significantly prolonged QTc, or malignant arrhythmia.  Disposition: Discharge. Patient is at baseline at this time. Return precautions expressed and understood in person. Advised follow up with primary care provider or clinic physician in next 24 hours.   FINAL CLINICAL IMPRESSION(S) / ED DIAGNOSES   Final diagnoses:  Syncope, unspecified syncope type  Transient hypotension  S/P angioplasty with stent   Rx / DC Orders   ED Discharge Orders          Ordered    Ambulatory referral to Cardiology       Comments: If you have not heard from the Cardiology office within the next 72 hours please call [phone].   02/18/24 1855           Note:  This document was prepared using Dragon voice recognition software and may include unintentional dictation errors.   Blair Lundeen K, MD 02/18/24 [phone]

## 2024-02-18 NOTE — ED Triage Notes (Signed)
 Pt to er room number 18 via ems, per ems pt had a heart attack and cardiac arrest on the 29th, states that she was at her pmd for a follow up today and had a seizure in the office.  Pt presents with a 20 gauge in her L hand, pt awake and answering questions.  Pt oriented times three.

## 2024-02-18 NOTE — ED Notes (Signed)
 Pt in bed, pt denies pain, family at bedside, pt talking in full sentences, resps even and unlabored, pt ambulatory to bathroom, urine sample obtained.

## 2024-02-18 NOTE — Discharge Instructions (Addendum)
 Please reduce your hydrochlorothiazide from 25 mg oral twice daily to 12.5 mg oral twice daily.

## 2024-02-18 NOTE — Progress Notes (Signed)
 Chief Complaint  Patient presents with  . Hospital Follow Up    Subjective  Debbie [name] is a 68 y.o. female who presents for Hospital Follow Up HPI History of Present Illness Debbie [name] is a 68 year old female who presents with a recent cardiac arrest. She is accompanied by her daughter, Debbie [name].  The daughter reports on February 03, 2024, the patient experienced a cardiac arrest while at church, leading to a myocardial infarction. She was unconscious for approximately ten minutes before being resuscitated by paramedics without the use of a defibrillator. It took about thirty minutes to stabilize her for transport to the hospital, which was three minutes away from the church. She was unresponsive and on a ventilator for the first few days in the hospital, but began to wake up by the end of the first week. She spent thirteen days in the critical care unit and one day on a recovery floor before being discharged home.  We are no electronic records due to the fact that the outside institution electronic medical record is not compatible with our [facility] system.  During her hospital stay, two stents  were placed in her heart to open up blocked arteries.  She also had angioplasty and balloon on 30 vessel. A CT scan and MRI were considered but not performed due to her fragile state. As she became more alert, she was able to follow commands, move her fingers and toes, and recognize familiar information when prompted.  But still there is some memory loss is that the daughter is able to recognize.  Since returning home three days ago, she has been using a walker and is able to walk around her single-floor home.   Her daughter, Debbie [name], noted some memory issues, as she initially could not recall her primary care physician's name or location, but was able to recognize it when shown familiar paperwork.   While carrying the history of the current events, mostly from the daughter, because the patient was not  responding verbally, the patient said she was not feeling well, she was experiencing chest pain across her chest, she requested water and then started to seize and suffer urinary incontinence.  The seizure was short lived.  Because of the seizure and chest pain EMS was summoned.  There is concern about potential oxygen deprivation effects on her brain, but she has been able to demonstrate cognitive functions such as knowing her name and location.  Review of Systems  Patient Active Problem List  Diagnosis  . Essential hypertension  . Peripheral arterial disease ()  . Hyperlipidemia  . Prediabetes  . Hypomagnesemia  . Obesity (BMI 30-39.9), unspecified  . Vitamin D  deficiency  . Low vitamin B12 level  . Morbid obesity with BMI of 40.0-44.9 (HCC)  . Pain in limb  . S/P angioplasty with stent - Right lower extremity  . Colon cancer (CMS/HHS-HCC)  . Tendinitis involving left hip abductors  . Pre-procedural examination  . Lumbar radiculopathy    Outpatient Medications Prior to Visit  Medication Sig Dispense Refill  . aspirin  81 MG EC tablet Take 81 mg by mouth every morning    . cholecalciferol (VITAMIN D3) 1000 unit tablet Take 1,000 Units by mouth every morning    . clindamycin (CLEOCIN T) 1 % topical solution Apply topically 2 (two) times daily APPLY TO AFFECTED AREA    . clobetasoL (TEMOVATE) 0.05 % ointment     . docusate (COLACE) 100 MG capsule Take 100 mg by mouth 2 (two)  times daily    . EPINEPHrine (EPIPEN) 0.3 mg/0.3 mL auto-injector     . gabapentin  (NEURONTIN ) 100 MG capsule Take 100-300 mg by mouth    . hydroCHLOROthiazide (HYDRODIURIL) 25 MG tablet Take 25 mg by mouth 2 (two) times daily    . HYDROcodone -acetaminophen  (NORCO) 5-325 mg tablet Take 1 tablet by mouth every 6 (six) hours as needed    . MAGNESIUM  ORAL Take 400 mg by mouth every morning    . metoprolol  succinate (TOPROL -XL) 25 MG XL tablet Take 1 pill at bedtime 30 tablet 11  . multivit-min/iron/folic/lutein  (MULTIVITAMIN WOMEN 50 PLUS ORAL) Take 1 tablet by mouth once daily    . ondansetron  (ZOFRAN ) 4 MG tablet Take 1 tablet (4 mg total) by mouth every 8 (eight) hours as needed for Nausea or Vomiting 20 tablet 0  . pantoprazole  (PROTONIX ) 40 MG DR tablet take 1 tablet by mouth before breakfast    . prazosin (MINIPRESS) 1 MG capsule Take 1 mg by mouth 2 (two) times daily    . rosuvastatin  (CRESTOR ) 40 MG tablet Take 40 mg by mouth once daily    . ticagrelor  (BRILINTA ) 90 mg tablet Take 90 mg by mouth 2 (two) times daily    . tirzepatide  (ZEPBOUND ) 15 mg/0.5 mL pen injector Inject 0.5 mLs (15 mg total) subcutaneously once a week 2 mL 2  . tiZANidine  (ZANAFLEX ) 2 MG tablet Take 2-4 mg by mouth    . tretinoin (RETIN-A) 0.05 % cream     . triamcinolone 0.5 % ointment Apply topically 2 (two) times daily 30 g 0  . valsartan (DIOVAN) 80 MG tablet Take 80 mg by mouth 2 (two) times daily    . clopidogreL  (PLAVIX ) 75 mg tablet Take 1 tablet (75 mg total) by mouth once daily You may restart taking this on 08/17/2021 90 tablet 3  . valsartan-hydroCHLOROthiazide (DIOVAN HCT) 160-25 mg tablet Take 1 tablet by mouth once daily 90 tablet 3  . acetaminophen  (TYLENOL ) 325 MG tablet Take 2 tablets (650 mg total) by mouth every 6 (six) hours for 10 days 30 tablet 0   No facility-administered medications prior to visit.      Objective  Vitals:   02/18/24 1409  BP: 129/82  Pulse: 91  SpO2: 100%  Weight: 99.8 kg (220 lb)  PainSc:   6  PainLoc: Chest   Body mass index is 36.05 kg/m.  Home Vitals:     Physical Exam Physical Exam VITALS: SaO2- 100%  Constitutional: alert, obese, in NAD, and per verbal communication, staring but not conversing verbally. Eye exam: sclera anicteric. Neck: supple, no thyroid enlargement or cervical adenopathy, and no bruits heard Respiratory: clear to auscultation, without rales or wheezes  Cardiovascular: regular rate and rhythm and without murmurs, rubs or gallops Lower  extremities: lower extremity edema moderate Neurological: Observed a brief seizure with urinary incontinence  Results Coronary artery stenting: Two stents placed (02/03/2024)   Balloon angioplasty: One balloon used (02/03/2024)     Assessment/Plan:   Assessment & Plan Chest pain concerning for cardiac event.  Recent cardiac arrest with myocardial infarction and stent placement Recent cardiac arrest with myocardial infarction requiring resuscitation and subsequent stent placement.  As reported by the daughter no defibrillator was used during the event. Two stents and one balloon were placed to open blocked arteries.  Stayed at cardiac ICU for several days until she became responsive.  Currently experiencing chest tightness, which may indicate ongoing cardiac issues. - Transport to emergency room for  full evaluation.  EMS was summoned and transported the patient - Ensure cardiac function is stable post-stent placement  Seizure  during visit Seizure occurred during the visit, likely related to previous cardiac arrest and possible oxygen deprivation. She became stiff and unresponsive, consistent with seizure activity. Oxygen levels were maintained at 2 liters.  She also had an event of urinary incontinence. - Transport to emergency room for evaluation and imaging - Monitor oxygen levels and maintain on 2 liters of oxygen  Cognitive impairment following cardiac arrest Cognitive impairment noted post-cardiac arrest, with difficulty recalling primary care provider and other details. Improvement observed as she can now recognize familiar information and follow commands.  Diagnoses and all orders for this visit:  Chest pain with high risk for cardiac etiology  H/O cardiac arrest  Seizure (CMS/HHS-HCC)  Complexity of care The symptoms that the patient presents represents a challenge in managing.  The risk of morbidity and mortality are high.  Patient transported to the emergency room because  of chest tightness and observed seizure. The decision-making component of this visit is very complex.   This visit was coded based on medical decision making (MDM).           Future Appointments     Date/Time Provider Department Center Visit Type   04/01/2024 8:10 AM (Arrive by 8:00 AM) Dale Almarie Knee, PA [facility] VIDEO VISIT RETURN   06/07/2024 8:00 AM (Arrive by 7:45 AM) Eliverto Bette Hover, MD [facility] OFFICE VISIT   12/31/2024 7:00 AM (Arrive by 6:45 AM) [facility] LAB   12/31/2024 8:00 AM Hsu, Beaulah Lenis, MD [facility] RETURN VISIT       There are no Patient Instructions on file for this visit.  An after visit summary was provided for the patient either in written format (printed) or through [facility].  This note has been created using automated tools and reviewed for accuracy by MARIO E OLMEDO.

## 2024-02-19 ENCOUNTER — Ambulatory Visit: Attending: Cardiology | Admitting: Cardiology

## 2024-02-19 ENCOUNTER — Observation Stay
Admission: EM | Admit: 2024-02-19 | Discharge: 2024-02-20 | Disposition: A | Discharge status: Home / Self Care | Attending: Internal Medicine | Admitting: Internal Medicine

## 2024-02-19 ENCOUNTER — Other Ambulatory Visit (HOSPITAL_BASED_OUTPATIENT_CLINIC_OR_DEPARTMENT_OTHER): Payer: Self-pay

## 2024-02-19 ENCOUNTER — Inpatient Hospital Stay

## 2024-02-19 ENCOUNTER — Encounter: Payer: Self-pay | Admitting: Cardiology

## 2024-02-19 ENCOUNTER — Other Ambulatory Visit: Payer: Self-pay

## 2024-02-19 VITALS — BP 91/64 | HR 78 | Ht 65.5 in | Wt 218.4 lb

## 2024-02-19 DIAGNOSIS — R569 Unspecified convulsions: Secondary | ICD-10-CM | POA: Diagnosis not present

## 2024-02-19 DIAGNOSIS — Z7982 Long term (current) use of aspirin: Secondary | ICD-10-CM | POA: Diagnosis not present

## 2024-02-19 DIAGNOSIS — R55 Syncope and collapse: Secondary | ICD-10-CM | POA: Diagnosis present

## 2024-02-19 DIAGNOSIS — D649 Anemia, unspecified: Secondary | ICD-10-CM | POA: Insufficient documentation

## 2024-02-19 DIAGNOSIS — I2510 Atherosclerotic heart disease of native coronary artery without angina pectoris: Secondary | ICD-10-CM

## 2024-02-19 DIAGNOSIS — E785 Hyperlipidemia, unspecified: Secondary | ICD-10-CM | POA: Diagnosis not present

## 2024-02-19 DIAGNOSIS — I213 ST elevation (STEMI) myocardial infarction of unspecified site: Secondary | ICD-10-CM | POA: Diagnosis not present

## 2024-02-19 DIAGNOSIS — I10 Essential (primary) hypertension: Secondary | ICD-10-CM | POA: Insufficient documentation

## 2024-02-19 LAB — HEPATIC FUNCTION PANEL
ALT: 40 U/L (ref 0–44)
AST: 41 U/L (ref 15–41)
Albumin: 3.1 g/dL — ABNORMAL LOW (ref 3.5–5.0)
Alkaline Phosphatase: 75 U/L (ref 38–126)
Bilirubin, Direct: 0.2 mg/dL (ref 0.0–0.2)
Indirect Bilirubin: 1.1 mg/dL — ABNORMAL HIGH (ref 0.3–0.9)
Total Bilirubin: 1.3 mg/dL — ABNORMAL HIGH (ref 0.0–1.2)
Total Protein: 6.2 g/dL — ABNORMAL LOW (ref 6.5–8.1)

## 2024-02-19 LAB — RESPIRATORY PANEL BY PCR

## 2024-02-19 LAB — TROPONIN I (HIGH SENSITIVITY): Troponin I (High Sensitivity): 13 ng/L (ref <18)

## 2024-02-19 LAB — MAGNESIUM: Magnesium: 1.9 mg/dL (ref 1.7–2.4)

## 2024-02-19 LAB — CBC
HCT: 30.4 % — ABNORMAL LOW (ref 36.0–46.0)
Hemoglobin: 9.7 g/dL — ABNORMAL LOW (ref 12.0–15.0)
MCH: 27.6 pg (ref 26.0–34.0)
MCHC: 31.9 g/dL (ref 30.0–36.0)
MCV: 86.4 fL (ref 80.0–100.0)
Platelets: 375 K/uL (ref 150–400)
RBC: 3.52 MIL/uL — ABNORMAL LOW (ref 3.87–5.11)
RDW: 17.3 % — ABNORMAL HIGH (ref 11.5–15.5)
WBC: 7.4 K/uL (ref 4.0–10.5)
nRBC: 0 % (ref 0.0–0.2)

## 2024-02-19 LAB — BASIC METABOLIC PANEL WITH GFR
Anion gap: 10 (ref 5–15)
BUN: 23 mg/dL (ref 8–23)
CO2: 24 mmol/L (ref 22–32)
Calcium: 9.3 mg/dL (ref 8.9–10.3)
Chloride: 105 mmol/L (ref 98–111)
Creatinine, Ser: 0.9 mg/dL (ref 0.44–1.00)
GFR, Estimated: >60 mL/min (ref >60)
Glucose, Bld: 172 mg/dL — ABNORMAL HIGH (ref 70–99)
Potassium: 3.5 mmol/L (ref 3.5–5.1)
Sodium: 139 mmol/L (ref 135–145)

## 2024-02-19 LAB — CK: Total CK: 34 U/L — ABNORMAL LOW (ref 38–234)

## 2024-02-19 LAB — BRAIN NATRIURETIC PEPTIDE: B Natriuretic Peptide: 64.1 pg/mL (ref 0.0–100.0)

## 2024-02-19 MED ORDER — POTASSIUM CHLORIDE CRYS ER 20 MEQ PO TBCR
40.0000 meq | EXTENDED_RELEASE_TABLET | Freq: Once | ORAL | Status: AC
  Administered 2024-02-19: 40 meq via ORAL
  Filled 2024-02-19: qty 2

## 2024-02-19 MED ORDER — HYDROXYZINE HCL 25 MG PO TABS: 25.0000 mg | ORAL_TABLET | Freq: Three times a day (TID) | ORAL | Status: DC | PRN

## 2024-02-19 MED ORDER — MAGNESIUM SULFATE 2 GM/50ML IV SOLN
2.0000 g | Freq: Once | INTRAVENOUS | Status: AC
  Administered 2024-02-19: 2 g via INTRAVENOUS
  Filled 2024-02-19: qty 50

## 2024-02-19 MED ORDER — DOCUSATE SODIUM 100 MG PO CAPS
100.0000 mg | ORAL_CAPSULE | Freq: Two times a day (BID) | ORAL | Status: DC
  Administered 2024-02-19 – 2024-02-20 (×2): 100 mg via ORAL
  Filled 2024-02-19 (×2): qty 1

## 2024-02-19 MED ORDER — ONDANSETRON HCL 4 MG/2ML IJ SOLN: 4.0000 mg | Freq: Four times a day (QID) | INTRAMUSCULAR | Status: DC | PRN

## 2024-02-19 MED ORDER — METOPROLOL SUCCINATE ER 25 MG PO TB24
25.0000 mg | ORAL_TABLET | Freq: Every day | ORAL | Status: DC
  Administered 2024-02-20: 25 mg via ORAL
  Filled 2024-02-19: qty 1

## 2024-02-19 MED ORDER — ONDANSETRON HCL 4 MG PO TABS
4.0000 mg | ORAL_TABLET | Freq: Four times a day (QID) | ORAL | Status: DC | PRN
Start: 2024-02-19 — End: 2024-02-20

## 2024-02-19 MED ORDER — TICAGRELOR 90 MG PO TABS
90.0000 mg | ORAL_TABLET | Freq: Two times a day (BID) | ORAL | Status: DC
  Administered 2024-02-19 – 2024-02-20 (×2): 90 mg via ORAL
  Filled 2024-02-19 (×2): qty 1

## 2024-02-19 MED ORDER — ACETAMINOPHEN 325 MG PO TABS: 650.0000 mg | ORAL_TABLET | Freq: Four times a day (QID) | ORAL | Status: DC | PRN

## 2024-02-19 MED ORDER — ACETAMINOPHEN 650 MG RE SUPP: 650.0000 mg | Freq: Four times a day (QID) | RECTAL | Status: DC | PRN

## 2024-02-19 MED ORDER — HYDROCODONE-ACETAMINOPHEN 5-325 MG PO TABS: 1.0000 | ORAL_TABLET | Freq: Four times a day (QID) | ORAL | Status: DC | PRN

## 2024-02-19 MED ORDER — IOHEXOL 350 MG/ML SOLN
75.0000 mL | Freq: Once | INTRAVENOUS | Status: AC | PRN
  Administered 2024-02-19: 75 mL via INTRAVENOUS

## 2024-02-19 MED ORDER — ASPIRIN 81 MG PO TBEC
81.0000 mg | DELAYED_RELEASE_TABLET | Freq: Every day | ORAL | Status: DC
Start: 2024-02-20 — End: 2024-02-20
  Administered 2024-02-20: 81 mg via ORAL
  Filled 2024-02-19: qty 1

## 2024-02-19 MED ORDER — SODIUM CHLORIDE 0.9 % IV SOLN: INTRAVENOUS | Status: AC

## 2024-02-19 MED ORDER — ALBUTEROL SULFATE (2.5 MG/3ML) 0.083% IN NEBU: 2.5000 mg | INHALATION_SOLUTION | RESPIRATORY_TRACT | Status: DC | PRN

## 2024-02-19 MED ORDER — PANTOPRAZOLE SODIUM 40 MG PO TBEC
40.0000 mg | DELAYED_RELEASE_TABLET | Freq: Every day | ORAL | Status: DC
  Administered 2024-02-20: 40 mg via ORAL
  Filled 2024-02-19: qty 1

## 2024-02-19 MED ORDER — ROSUVASTATIN CALCIUM 20 MG PO TABS
40.0000 mg | ORAL_TABLET | Freq: Every day | ORAL | Status: DC
  Administered 2024-02-19: 40 mg via ORAL
  Filled 2024-02-19 (×2): qty 2

## 2024-02-19 NOTE — ED Notes (Signed)
 This RN and Yaileen Hofferber EDT changed pt and provided pericare. Pt belongings given to daughter. Brief applied. Socks applied. 2 warm blankets provided. Daughter at bedside. CCB within reach. NAD

## 2024-02-19 NOTE — ED Triage Notes (Addendum)
 First nurse note: Pt to ED via POV from heart care. Pt was seen here yesterday for seizure like activity. Pt was seen today for follow up and had seizure like activity in office lasting about 20secs. Pt reports possible auro. Loss of bladder control. Pt does not remember what happened. Pt with hx of MI. BP 88/57 with repeat normotensive. Upon lying pt flat oxygen 85% and placed on 2L [place]. Pt currently A&Ox3

## 2024-02-19 NOTE — ED Notes (Signed)
 SEIZURE precautions in place.

## 2024-02-19 NOTE — H&P (Signed)
 History and Physical    Debbie [name] FMW:[id number] DOB: 1956-03-10 DOA: 02/19/2024  PCP: Eliverto Bette Hover, MD  Patient coming from:   I have personally briefly reviewed patient's old medical records in [facility]  Chief Complaint: syncope/ seizure like episode   HPI: Debbie [name] is a 68 y.o. female with medical history significant of hypertension, hyperlipidemia, PAD (s/p right femoral stent 2008, femoral bypass 2013)  with interim history of STEMI s/p PCIx2 02/03/24, c/b cardiac arrest. Patient was discharged on 8/10 and on f/u with pcp had episode of seizure like activity and was referred to ED. Patient in ED had negative CTH , as well as initial screening cardiac evaluation. At that juncture patient was sent home. Patient today in cardiology clinic was noted to have complaint of dizziness and then had blank stare followed by full syncope with urinary incontinence with  what appeared to be posturing of arms. Patient had LOC for around 2-3 minutes. Vitals at that time noted systolic bp of 112 and hr of 87. EKG for that visit noted nsr with PAC's.Patient around 5 minutes after episode was back to baseline mentation.  Patient was then transported to ED for further evaluation. Per patient feels back her normal self, she notes no current chest pain, n/v/d/ or abdominal pain, she also denies f/c.   ED Course:   Vitals afeb, 121/92, hr 69, hr 75 , sat 99%   Labs:  Wbc 7.4, hg 9.7 ( based was 13), plt 375  rdw 17.3  Na 139, K 3.5, Cl 105, glu 172 . Cr 0.9  Cxr IMPRESSION: Low lung volumes with mild left basilar atelectasis and/or infiltrate.   iMPRESSION: Normal brain MRI for patient age.  No acute abnormality.  EKG:snr , pac Case discussed with DR Matthews who notes symptoms most like related to cardiogenic syncope not  Seizure d/o. NO AED or further neuro work up warranted at this time.  Rec  cardiology consultation. Review of Systems: As per HPI otherwise 10 point review of  systems negative.   Past Medical History:  Diagnosis Date   Genital warts    Hyperlipidemia    Hypertension    Myocardial infarction [facility])    Peripheral arterial disease (HCC)    Status post angioplasty with stent    Known femoral stent occlusion    Past Surgical History:  Procedure Laterality Date   ABDOMINAL AORTAGRAM N/A 08/09/2011   Procedure: ABDOMINAL EZELLA;  Surgeon: Carlin FORBES Haddock, MD;  Location: [facility] CATH LAB;  Service: Cardiovascular;  Laterality: N/A;   ANGIOPLASTY / STENTING FEMORAL  2008   Right femoral artery stenting done in [place]    COLONOSCOPY WITH PROPOFOL  N/A 06/11/2021   Procedure: COLONOSCOPY WITH PROPOFOL ;  Surgeon: Therisa Bi, MD;  Location: [facility] ENDOSCOPY;  Service: Gastroenterology;  Laterality: N/A;  2ND ARRIVAL, PLEASE   FEMORAL BYPASS Right 08/2011   Dr. Sherre at [facility]     reports that she has never smoked. She has never used smokeless tobacco. She reports that she does not drink alcohol and does not use drugs.  Allergies  Allergen Reactions   Sulfa Antibiotics     Family History  Problem Relation Age of Onset   Uterine cancer Mother    Diabetes Sister    Hypertension Sister    Breast cancer Maternal Aunt    Stroke Maternal Uncle     Prior to Admission medications   Medication Sig Start Date End Date Taking? Authorizing Provider  aspirin  81 MG EC tablet  Take 1 tablet by mouth daily.    [provider]  betamethasone  dipropionate 0.05 % cream Apply topically 2 (two) times daily. Do not use for more than 10 days Patient not taking: Reported on 02/19/2024 08/08/21   Cleotilde Ronal RAMAN, MD  docusate sodium  (COLACE) 100 MG capsule Take 100 mg by mouth 2 (two) times daily.    [provider]  gabapentin  (NEURONTIN ) 100 MG capsule Take 1-3 capsules (100-300 mg total) by mouth 3 (three) times daily as needed. 01/30/24   Corey, Evan S, MD  hydrochlorothiazide (HYDRODIURIL) 25 MG tablet Take 25 mg by mouth daily.    [provider]   HYDROcodone -acetaminophen  (NORCO/VICODIN) 5-325 MG tablet Take 1 tablet by mouth every 6 (six) hours as needed. 01/29/24   Corey, Evan S, MD  hydrOXYzine  (ATARAX ) 25 MG tablet Take 25 mg by mouth 3 (three) times daily as needed (for anxiety).    [provider]  metoprolol  succinate (TOPROL -XL) 25 MG 24 hr tablet Take 25 mg by mouth daily.    [provider]  pantoprazole  (PROTONIX ) 40 MG tablet Take 40 mg by mouth daily before breakfast.    [provider]  Rosuvastatin  Calcium  40 MG CPSP Take 40 mg by mouth at bedtime. 11/05/19   [provider]  ticagrelor  (BRILINTA ) 90 MG TABS tablet Take 90 mg by mouth 2 (two) times daily.    [provider]  tirzepatide  (ZEPBOUND ) 15 MG/0.5ML Pen Inject 15 mg into the skin once a week. 07/22/23     tiZANidine  (ZANAFLEX ) 2 MG tablet Take 1-2 tablets (2-4 mg total) by mouth every 8 (eight) hours as needed. 01/23/24   Corey, Evan S, MD  valsartan (DIOVAN) 80 MG tablet Take 80 mg by mouth daily.    [provider]    Physical Exam: Vitals:   02/19/24 1007 02/19/24 1023 02/19/24 1215  BP: 98/63 (!) 121/92 (!) 116/46  Pulse: 78 71 69  Resp: 17 18   Temp: 98 F (36.7 C)    TempSrc: Oral    SpO2: 100% 99% 99%  Height: 5' 5.5 (1.664 m)      Constitutional: NAD, calm, comfortable Vitals:   02/19/24 1007 02/19/24 1023 02/19/24 1215  BP: 98/63 (!) 121/92 (!) 116/46  Pulse: 78 71 69  Resp: 17 18   Temp: 98 F (36.7 C)    TempSrc: Oral    SpO2: 100% 99% 99%  Height: 5' 5.5 (1.664 m)     Eyes: PERRL, lids and conjunctivae normal ENMT: Mucous membranes are moist. Posterior pharynx clear of any exudate or lesions.Normal dentition.  Neck: normal, supple, no masses, no thyromegaly Respiratory: clear to auscultation bilaterally, no wheezing, no crackles. Normal respiratory effort. No accessory muscle use.  Cardiovascular: Regular rate and rhythm, no murmurs / rubs / gallops. No extremity edema. 2+ pedal  pulses.   Abdomen: no tenderness, no masses palpated. No hepatosplenomegaly. Bowel sounds positive.  Musculoskeletal: no clubbing / cyanosis. No joint deformity upper and lower extremities. Good ROM, no contractures. Normal muscle tone.  Skin: no rashes, lesions, ulcers. No induration Neurologic: CN grossly intact. Sensation intact, Strength 5/5 in all 4.  Psychiatric: Normal judgment and insight. Alert and oriented x 3. Normal mood.    Labs on Admission: I have personally reviewed following labs and imaging studies  CBC: Recent Labs  Lab 02/18/24 1556 02/19/24 1017  WBC 8.3 7.4  NEUTROABS 5.5  --   HGB 9.4* 9.7*  HCT 29.6* 30.4*  MCV 87.8  86.4  PLT 377 375   Basic Metabolic Panel: Recent Labs  Lab 02/18/24 1556 02/19/24 1017  NA 138 139  K 3.3* 3.5  CL 106 105  CO2 23 24  GLUCOSE 147* 172*  BUN 28* 23  CREATININE 0.69 0.90  CALCIUM  8.9 9.3   GFR: Estimated Creatinine Clearance: 68.5 mL/min (by C-G formula based on SCr of 0.9 mg/dL). Liver Function Tests: Recent Labs  Lab 02/18/24 1556 02/19/24 1017  AST 41 41  ALT 38 40  ALKPHOS 70 75  BILITOT 1.5* 1.3*  PROT 6.4* 6.2*  ALBUMIN 2.9* 3.1*   No results for input(s): LIPASE, AMYLASE in the last 168 hours. No results for input(s): AMMONIA in the last 168 hours. Coagulation Profile: No results for input(s): INR, PROTIME in the last 168 hours. Cardiac Enzymes: Recent Labs  Lab 02/19/24 1017  CKTOTAL 34*   BNP (last 3 results) No results for input(s): PROBNP in the last 8760 hours. HbA1C: No results for input(s): HGBA1C in the last 72 hours. CBG: No results for input(s): GLUCAP in the last 168 hours. Lipid Profile: No results for input(s): CHOL, HDL, LDLCALC, TRIG, CHOLHDL, LDLDIRECT in the last 72 hours. Thyroid Function Tests: No results for input(s): TSH, T4TOTAL, FREET4, T3FREE, THYROIDAB in the last 72 hours. Anemia Panel: No results for input(s):  VITAMINB12, FOLATE, FERRITIN, TIBC, IRON, RETICCTPCT in the last 72 hours. Urine analysis:    Component Value Date/Time   COLORURINE YELLOW (A) 02/18/2024 1555   APPEARANCEUR CLEAR (A) 02/18/2024 1555   LABSPEC 1.012 02/18/2024 1555   PHURINE 6.0 02/18/2024 1555   GLUCOSEU NEGATIVE 02/18/2024 1555   HGBUR NEGATIVE 02/18/2024 1555   BILIRUBINUR NEGATIVE 02/18/2024 1555   BILIRUBINUR n 08/12/2016 0910   KETONESUR NEGATIVE 02/18/2024 1555   PROTEINUR NEGATIVE 02/18/2024 1555   UROBILINOGEN negative 08/12/2016 0910   UROBILINOGEN 0.2 10/30/2010 1355   NITRITE NEGATIVE 02/18/2024 1555   LEUKOCYTESUR NEGATIVE 02/18/2024 1555    Radiological Exams on Admission: DG Chest Port 1 View Result Date: 02/18/2024 CLINICAL DATA:  Recent history of heart attack, cardiac arrest and seizure sent for follow-up examination. EXAM: PORTABLE CHEST 1 VIEW COMPARISON:  None Available. FINDINGS: The heart size and mediastinal contours are within normal limits. There is marked severity calcification of the aortic arch. Low lung volumes are noted. Mild atelectasis and/or infiltrate is seen within the left lung base. A chronic appearing deformity is seen involving the distal right clavicle. Degenerative changes are seen throughout the thoracic spine. IMPRESSION: Low lung volumes with mild left basilar atelectasis and/or infiltrate. Electronically Signed   By: Suzen Dials M.D.   On: 02/18/2024 19:10   CT Head Wo Contrast Result Date: 02/18/2024 EXAM: CT HEAD WITHOUT CONTRAST 02/18/2024 04:11:47 PM TECHNIQUE: CT of the head was performed without the administration of intravenous contrast. Automated exposure control, iterative reconstruction, and/or weight based adjustment of the mA/kV was utilized to reduce the radiation dose to as low as reasonably achievable. COMPARISON: CT head 07/07/2022 CLINICAL HISTORY: Seizure, new-onset, no history of trauma. Recent cardiac arrest. FINDINGS: BRAIN AND VENTRICLES:  No acute hemorrhage. Gray-white differentiation is preserved. No hydrocephalus. No extra-axial collection. No mass effect or midline shift. Cerebral white matter hypodensities are similar to the prior CT and nonspecific but compatible with mild chronic small vessel ischemic disease. Mild generalized cerebral atrophy is within normal limits for age. ORBITS: No acute abnormality. SINUSES: No acute abnormality. SOFT TISSUES AND SKULL: No acute soft tissue abnormality. No skull fracture. Calcified atherosclerosis at  the skull base. IMPRESSION: 1. No acute intracranial abnormality. 2. Mild chronic small vessel ischemic disease. Electronically signed by: Dasie Hamburg MD 02/18/2024 05:30 PM EDT RP Workstation: [id number]    EKG: Independently reviewed.   Assessment/Plan Principal Problem:   Syncope and collapse   Syncope vs Seizure episode - patient currently back to baseline  -per staff notes no post ictal period , however noted bladder incontinence -per initial neurology rec no AED at this time  - second episode in 48 hours  -admit to progressive care  - check orthostatic blood vitals  -place on seizure precautions  - MRI negative ,f/u Echo,  -neuro checks  -gentle ivfs -will need cardiology f/u in am  -will most likely need event monitoring on discharge   Hypertension -had low bp documented in cardiology office high 80's systolic but on repeat was in 879'd unclear if spurious - in any event , will hold bp medications for now  -resume in am if ortho static negative   HLD -resume statin   CAD s/p STEMI C/B cardiac arrest  - occurred one week ago s/p 2DES  - on brilinta    Anemia  - monitor h/h  - check anemia panel  -check fob   DVT prophylaxis: scd Code Status: full/ as discussed per patient wishes in event of cardiac arrest  Family Communication: none at bedside Disposition Plan: full/ as discussed per patient wishes in event of cardiac arrest  Consults called: cardiology: Dr  Darliss , neurology :Dr Matthews Admission status:    Camila DELENA Ned MD [facility]   If 7PM-7AM, please contact night-coverage [email or web address] Password [employer]  02/19/2024, 12:57 PM

## 2024-02-19 NOTE — ED Provider Notes (Signed)
 [facility] Provider Note    Event Date/Time   First MD Initiated Contact with Patient 02/19/24 1021     (approximate)   History   Seizures   HPI  Debbie [name] is a 68 year old female with history of hypertension, recent cardiac arrest at outside hospital system presenting to the emergency department for evaluation of seizure-like activity.  Seen in our ER yesterday for evaluation of seizure presyncope.  Reassuring workup.  Patient was discharged with plans for outpatient follow-up.  Today, patient had follow-up in our cardiology office.  There, she had a second episode of shaking and patient was sent to the ER for further evaluation.  I will moving from clinic to our lobby and while in the lobby, patient had 2 additional episodes of abnormal movements.  Daughter present during these episode and notes that the patient will stiffen, have shaking of her bilateral arms and legs for about 20 seconds.  She has had urinary incontinence during multiple of these episodes.  She is then awake, does not appear confused.  No history of similar episodes prior to yesterday.  Patient reports some ongoing chest wall tenderness which has been present since she received CPR.  Denies other complaints.  Patient recently admitted from 7/29 to 8/10 in the [facility] in [place] .  Daughter has the patient discharge summary which notes cardiac arrest due to abnormal heart rhythm requiring chest compressions, heart attack requiring stents to be placed, low blood pressure due to abdominal heart function, shortness of breath requiring intubation, severe blood loss requiring blood transfusion.     Physical Exam   Triage Vital Signs: ED Triage Vitals [02/19/24 1007]  Encounter Vitals Group     BP 98/63     Girls Systolic BP Percentile      Girls Diastolic BP Percentile      Boys Systolic BP Percentile      Boys Diastolic BP Percentile      Pulse Rate 78     Resp 17      Temp 98 F (36.7 C)     Temp Source Oral     SpO2 100 %     Weight      Height 5' 5.5 (1.664 m)     Head Circumference      Peak Flow      Pain Score 0     Pain Loc      Pain Education      Exclude from Growth Chart     Most recent vital signs: Vitals:   02/19/24 1215 02/19/24 1517  BP: (!) 116/46   Pulse: 69   Resp:    Temp:  98.3 F (36.8 C)  SpO2: 99%      General: Awake, interactive  CV:  Regular rate, good peripheral perfusion.  Resp:  Unlabored respirations, lungs clear to auscultation Abd:  Nondistended.  Soft, nontender Neuro:  .Alert and oriented, normal extraocular movements, symmetric facial movement, sensation intact over bilateral upper and lower extremities with 5 out of 5 strength.  Normal finger-to-nose testing.   ED Results / Procedures / Treatments   Labs (all labs ordered are listed, but only abnormal results are displayed) Labs Reviewed  BASIC METABOLIC PANEL WITH GFR - Abnormal; Notable for the following components:      Result Value   Glucose, Bld 172 (*)    All other components within normal limits  CBC - Abnormal; Notable for the following components:   RBC 3.52 (*)  Hemoglobin 9.7 (*)    HCT 30.4 (*)    RDW 17.3 (*)    All other components within normal limits  HEPATIC FUNCTION PANEL - Abnormal; Notable for the following components:   Total Protein 6.2 (*)    Albumin 3.1 (*)    Total Bilirubin 1.3 (*)    Indirect Bilirubin 1.1 (*)    All other components within normal limits  CK - Abnormal; Notable for the following components:   Total CK 34 (*)    All other components within normal limits  RESPIRATORY PANEL BY PCR  MAGNESIUM   BRAIN NATRIURETIC PEPTIDE  HIV ANTIBODY (ROUTINE TESTING W REFLEX)  HEMOGLOBIN A1C  URINE DRUG SCREEN, QUALITATIVE ([facility] ONLY)  CBG MONITORING, ED  TROPONIN I (HIGH SENSITIVITY)     EKG EKG independently reviewed and interpreted by myself demonstrates:  EKG demonstrates sinus rhythm at a rate of  86, PR 130, QRS 96, QTc 442, demonstrates sinus rhythm with frequent PACs  RADIOLOGY Imaging independently reviewed and interpreted by myself demonstrates:  Reviewed imaging from yesterday demonstrates CXR with questionable atelectasis versus infiltrate CT head without acute bleed  Formal Radiology Read:  [facility] Chest Port 1 View Result Date: 02/18/2024 CLINICAL DATA:  Recent history of heart attack, cardiac arrest and seizure sent for follow-up examination. EXAM: PORTABLE CHEST 1 VIEW COMPARISON:  None Available. FINDINGS: The heart size and mediastinal contours are within normal limits. There is marked severity calcification of the aortic arch. Low lung volumes are noted. Mild atelectasis and/or infiltrate is seen within the left lung base. A chronic appearing deformity is seen involving the distal right clavicle. Degenerative changes are seen throughout the thoracic spine. IMPRESSION: Low lung volumes with mild left basilar atelectasis and/or infiltrate. Electronically Signed   By: Suzen Dials M.D.   On: 02/18/2024 19:10    PROCEDURES:  Critical Care performed: No  Procedures   MEDICATIONS ORDERED IN ED: Medications  0.9 %  sodium chloride  infusion ( Intravenous New Bag/Given 02/19/24 1331)  aspirin  EC tablet 81 mg (has no administration in time range)  rosuvastatin  (CRESTOR ) tablet 40 mg (has no administration in time range)  docusate sodium  (COLACE) capsule 100 mg (has no administration in time range)  pantoprazole  (PROTONIX ) EC tablet 40 mg (has no administration in time range)  ticagrelor  (BRILINTA ) tablet 90 mg (has no administration in time range)  acetaminophen  (TYLENOL ) tablet 650 mg (has no administration in time range)    Or  acetaminophen  (TYLENOL ) suppository 650 mg (has no administration in time range)  ondansetron  (ZOFRAN ) tablet 4 mg (has no administration in time range)    Or  ondansetron  (ZOFRAN ) injection 4 mg (has no administration in time range)  albuterol   (PROVENTIL ) (2.5 MG/3ML) 0.083% nebulizer solution 2.5 mg (has no administration in time range)  potassium chloride  SA (KLOR-CON  M) CR tablet 40 mEq (40 mEq Oral Given 02/19/24 1322)  magnesium  sulfate IVPB 2 g 50 mL (0 g Intravenous Stopped 02/19/24 1437)  iohexol  (OMNIPAQUE ) 350 MG/ML injection 75 mL (75 mLs Intravenous Contrast Given 02/19/24 1550)     IMPRESSION / MDM / ASSESSMENT AND PLAN / ED COURSE  I reviewed the triage vital signs and the nursing notes.  Differential diagnosis includes, but is not limited to, convulsive syncope, arrhythmia, electrolyte abnormality, primary seizure disorder, lower suspicion acute intracranial process with negative head imaging yesterday and no change in character of episodes, consideration for intracranial complication from recent arrest including hypoxic injury, no focal deficits suggestive of CVA and outside  window for intervention  Patient's presentation is most consistent with acute presentation with potential threat to life or bodily function.  68 year old female presenting to the emergency department for evaluation of multiple episodes of syncope with abnormal movements.  Stable vitals on presentation. Presents now with 4 episodes of under 24 hours.  Labs with CBC with stable anemia.  BMP without significant derangement.  LFTs without critical derangements.  Normal troponin.  CK 34, with this, somewhat less suggestive of true seizure activity.  With multiple episodes, I did discuss the case with Dr. Matthews with neurology.  With patient's clinical history and absence of postictal period, she has much lower suspicion for seizures and suspect more likely cardiac etiology.  Patient is noted to have frequent ectopy here.  With 4 episodes of syncope with convulsions, do think she is appropriate for admission.  Will reach out to hospitalist team.  Case discussed with hospitalist team.  They will evaluate for anticipated admission.     FINAL CLINICAL  IMPRESSION(S) / ED DIAGNOSES   Final diagnoses:  Syncope and collapse  Seizure-like activity (HCC)     Rx / DC Orders   ED Discharge Orders     None        Note:  This document was prepared using Dragon voice recognition software and may include unintentional dictation errors.   Levander Slate, MD 02/19/24 [phone]

## 2024-02-19 NOTE — Progress Notes (Signed)
 Cardiology Office Note:    Date:  02/19/2024   ID:  Debbie [name], DOB 12-Dec-1955, MRN [id number]  PCP:  Eliverto Bette Hover, MD   [facility] Providers Cardiologist:  None     Referring MD: Jossie Artist POUR, MD   Chief Complaint  Patient presents with   Follow-up    Following up post hospital visit. Patient had an heart attack. Patient states that she feels short of breath and feels weakness in her walk. Meds reviewed.    Debbie [name] is a 68 y.o. female who is being seen today for the evaluation CAD at the request of Bradler, Artist POUR, MD.  History of Present Illness:    Debbie [name] is a 68 y.o. female with a hx of STEMI s/p PCIx2 02/03/24, hypertension, hyperlipidemia, PAD (s/p right femoral stent 2008, femoral bypass 2013) who presents to establish care due to recent MI.  Patient had a heart attack last week in [place].  Presented to [facility] health, in [place]  due to a cardiac arrest.  Apparently underwent CPR x 10 minutes before ROSC, requiring intubation.  Presented to [facility] health in [place], cardiac cath revealed obstructive disease, 2 drug-eluting stents where placed presumably to LAD.  She was discharged 4 days ago on 02/15/2024.  Followed up yesterday with her primary care physician when she had an episode of seizure-like activity prompting her to be taken to the ED.  Troponins were normal.  Head CT was unrevealing.  Patient was monitored in the ED and discharged home.  While in the office today, she was conversant, but suddenly felt dizzy and faint.  Staff noted patient passing out, having a blank stare while sitting on the exam table.  She had incontinence of urine, her arms seem tense.  She was out for about 1-2 minutes, systolic blood pressure was 112, heart rate 87.  She regained consciousness roughly 3 to 5 minutes later, able to carry out full conversations.    Past Medical History:  Diagnosis Date   Genital warts    Hyperlipidemia     Hypertension    Myocardial infarction [facility])    Peripheral arterial disease (HCC)    Status post angioplasty with stent    Known femoral stent occlusion    Past Surgical History:  Procedure Laterality Date   ABDOMINAL AORTAGRAM N/A 08/09/2011   Procedure: ABDOMINAL EZELLA;  Surgeon: Carlin FORBES Haddock, MD;  Location: [facility] CATH LAB;  Service: Cardiovascular;  Laterality: N/A;   ANGIOPLASTY / STENTING FEMORAL  2008   Right femoral artery stenting done in [place]    COLONOSCOPY WITH PROPOFOL  N/A 06/11/2021   Procedure: COLONOSCOPY WITH PROPOFOL ;  Surgeon: Therisa Bi, MD;  Location: [facility] ENDOSCOPY;  Service: Gastroenterology;  Laterality: N/A;  2ND ARRIVAL, PLEASE   FEMORAL BYPASS Right 08/2011   Dr. Sherre at [facility]    Current Medications: Current Meds  Medication Sig   aspirin  81 MG EC tablet Take 1 tablet by mouth daily.   docusate sodium  (COLACE) 100 MG capsule Take 100 mg by mouth 2 (two) times daily.   gabapentin  (NEURONTIN ) 100 MG capsule Take 1-3 capsules (100-300 mg total) by mouth 3 (three) times daily as needed.   hydrochlorothiazide (HYDRODIURIL) 25 MG tablet Take 25 mg by mouth daily.   HYDROcodone -acetaminophen  (NORCO/VICODIN) 5-325 MG tablet Take 1 tablet by mouth every 6 (six) hours as needed.   hydrOXYzine  (ATARAX ) 25 MG tablet Take 25 mg by mouth 3 (three) times daily as needed (for anxiety).   metoprolol   succinate (TOPROL -XL) 25 MG 24 hr tablet Take 25 mg by mouth daily.   pantoprazole  (PROTONIX ) 40 MG tablet Take 40 mg by mouth daily before breakfast.   Rosuvastatin  Calcium  40 MG CPSP Take 40 mg by mouth at bedtime.   ticagrelor  (BRILINTA ) 90 MG TABS tablet Take 90 mg by mouth 2 (two) times daily.   tirzepatide  (ZEPBOUND ) 15 MG/0.5ML Pen Inject 15 mg into the skin once a week.   tiZANidine  (ZANAFLEX ) 2 MG tablet Take 1-2 tablets (2-4 mg total) by mouth every 8 (eight) hours as needed.   valsartan (DIOVAN) 80 MG tablet Take 80 mg by mouth daily.     Allergies:   Sulfa  antibiotics   Social History   Socioeconomic History   Marital status: Single    Spouse name: Not on file   Number of children: Not on file   Years of education: Not on file   Highest education level: Not on file  Occupational History   Not on file  Tobacco Use   Smoking status: Never   Smokeless tobacco: Never  Vaping Use   Vaping status: Never Used  Substance and Sexual Activity   Alcohol use: No   Drug use: No   Sexual activity: Not Currently    Birth control/protection: Post-menopausal  Other Topics Concern   Not on file  Social History Narrative   Not on file   Social Drivers of Health   Financial Resource Strain: Low Risk  (06/02/2023)   Received from [facility]   Overall Financial Resource Strain (CARDIA)    Difficulty of Paying Living Expenses: Not hard at all  Food Insecurity: Food Insecurity Present (06/02/2023)   Received from [facility]   Hunger Vital Sign    Within the past 12 months, you worried that your food would run out before you got the money to buy more.: Never true    Within the past 12 months, the food you bought just didn't last and you didn't have money to get more.: Sometimes true  Transportation Needs: No Transportation Needs (06/02/2023)   Received from [facility] - Transportation    In the past 12 months, has lack of transportation kept you from medical appointments or from getting medications?: No    Lack of Transportation (Non-Medical): No  Physical Activity: Not on file  Stress: Not on file  Social Connections: Not on file     Family History: The patient's family history includes Breast cancer in her maternal aunt; Diabetes in her sister; Hypertension in her sister; Stroke in her maternal uncle; Uterine cancer in her mother.  ROS:   Please see the history of present illness.     All other systems reviewed and are negative.  EKGs/Labs/Other Studies Reviewed:     The following studies were reviewed today:  EKG Interpretation Date/Time:  Thursday February 19 2024 09:22:24 EDT Ventricular Rate:  86 PR Interval:  126 QRS Duration:  84 QT Interval:  372 QTC Calculation: 445 R Axis:   -17  Text Interpretation: Sinus rhythm with Premature atrial complexes Cannot rule out Anterior infarct , age undetermined Confirmed by Darliss Rogue ([id number]) on 02/19/2024 9:52:59 AM    Recent Labs: 02/18/2024: ALT 38; B Natriuretic Peptide 59.6; BUN 28; Creatinine, Ser 0.69; Hemoglobin 9.4; Platelets 377; Potassium 3.3; Sodium 138  Recent Lipid Panel    Component Value Date/Time   CHOL 208 (H) 11/28/2017 1656   TRIG 117 11/28/2017 1656  HDL 47 11/28/2017 1656   CHOLHDL 4.4 11/28/2017 1656   LDLCALC 138 (H) 11/28/2017 1656     Risk Assessment/Calculations:             Physical Exam:    VS:  BP 91/64   Pulse 78   Ht 5' 5.5 (1.664 m)   Wt 218 lb 6.4 oz (99.1 kg)   LMP 07/09/2007 (Approximate)   SpO2 98%   BMI 35.79 kg/m     Wt Readings from Last 3 Encounters:  02/19/24 218 lb 6.4 oz (99.1 kg)  02/18/24 220 lb (99.8 kg)  01/07/24 220 lb (99.8 kg)     GEN:  Well nourished, soft-spoken HEENT: Normal NECK: No JVD; No carotid bruits CARDIAC: RRR, no murmurs, rubs, gallops RESPIRATORY:  Clear to auscultation without rales, wheezing or rhonchi  ABDOMEN: Soft, non-tender, distended MUSCULOSKELETAL:  No edema; No deformity  SKIN: Warm and dry NEUROLOGIC:  Alert and oriented x 3 PSYCHIATRIC:  Normal affect   ASSESSMENT:    1. Syncope and collapse   2. Coronary artery disease involving native heart, unspecified vessel or lesion type, unspecified whether angina present    PLAN:    In order of problems listed above:  Syncope, became unconscious with blank stare, rigid extremities, urine incontinence, suggesting seizure activity as recent fall syncope.  With recent MI, arrhythmia is also possible but less likely based on patient  presentation.  Recommend patient be transferred to the ED for full neurowork-up, seizure management input.  Plan to monitor patient with cardiac monitor while admitted, discharge patient on cardiac monitor when able to go home.  Plan to obtain echocardiogram in-house. CAD/STEMI s/p PCI times 27/29/2025.  Currently denies chest pain.  Continue aspirin , Brilinta , Crestor  40 mg daily.  BP low normal, hold losartan and Toprol -XL for now.     Follow-up after hospital discharge  Medication Adjustments/Labs and Tests Ordered: Current medicines are reviewed at length with the patient today.  Concerns regarding medicines are outlined above.  Orders Placed This Encounter  Procedures   EKG 12-Lead   No orders of the defined types were placed in this encounter.   There are no Patient Instructions on file for this visit.   Signed, Redell Cave, MD  02/19/2024 10:19 AM    [facility]

## 2024-02-19 NOTE — ED Notes (Signed)
 EDP called in regard to pt coming to 5h. Pt had 2 witnessed episodes of seizure like activity lasting 20-30secs each.

## 2024-02-19 NOTE — ED Triage Notes (Signed)
 Pt here with a possible seizure. Pt was at a follow up appt and started shaking, similar to the same event as yesterday. Pt back at baseline. Pt denies pain.

## 2024-02-20 ENCOUNTER — Inpatient Hospital Stay
Admit: 2024-02-20 | Discharge: 2024-02-20 | Disposition: A | Discharge status: Home / Self Care | Attending: Physician Assistant | Admitting: Physician Assistant

## 2024-02-20 ENCOUNTER — Inpatient Hospital Stay (HOSPITAL_BASED_OUTPATIENT_CLINIC_OR_DEPARTMENT_OTHER)
Admit: 2024-02-20 | Discharge: 2024-02-20 | Disposition: A | Discharge status: Home / Self Care | Attending: Internal Medicine | Admitting: Internal Medicine

## 2024-02-20 ENCOUNTER — Telehealth: Payer: Self-pay | Admitting: Emergency Medicine

## 2024-02-20 DIAGNOSIS — R571 Hypovolemic shock: Secondary | ICD-10-CM | POA: Diagnosis not present

## 2024-02-20 DIAGNOSIS — R55 Syncope and collapse: Secondary | ICD-10-CM | POA: Diagnosis not present

## 2024-02-20 DIAGNOSIS — I10 Essential (primary) hypertension: Secondary | ICD-10-CM

## 2024-02-20 DIAGNOSIS — D500 Iron deficiency anemia secondary to blood loss (chronic): Secondary | ICD-10-CM | POA: Diagnosis not present

## 2024-02-20 DIAGNOSIS — D649 Anemia, unspecified: Secondary | ICD-10-CM

## 2024-02-20 DIAGNOSIS — E785 Hyperlipidemia, unspecified: Secondary | ICD-10-CM

## 2024-02-20 DIAGNOSIS — I2510 Atherosclerotic heart disease of native coronary artery without angina pectoris: Secondary | ICD-10-CM | POA: Diagnosis not present

## 2024-02-20 LAB — IRON AND TIBC
Iron: 51 ug/dL (ref 28–170)
Saturation Ratios: 22 % (ref 10.4–31.8)
TIBC: 232 ug/dL — ABNORMAL LOW (ref 250–450)
UIBC: 181 ug/dL

## 2024-02-20 LAB — ECHOCARDIOGRAM COMPLETE
Area-P 1/2: 4.1 cm2
Calc EF: 63 %
Height: 65.5 in
S' Lateral: 2.8 cm
Single Plane A2C EF: 64 %
Single Plane A4C EF: 64.2 %
Weight: 3494.4 [oz_av]

## 2024-02-20 LAB — COMPREHENSIVE METABOLIC PANEL WITH GFR
ALT: 32 U/L (ref 0–44)
AST: 32 U/L (ref 15–41)
Albumin: 2.6 g/dL — ABNORMAL LOW (ref 3.5–5.0)
Alkaline Phosphatase: 74 U/L (ref 38–126)
Anion gap: 11 (ref 5–15)
BUN: 20 mg/dL (ref 8–23)
CO2: 22 mmol/L (ref 22–32)
Calcium: 8.8 mg/dL — ABNORMAL LOW (ref 8.9–10.3)
Chloride: 108 mmol/L (ref 98–111)
Creatinine, Ser: 0.83 mg/dL (ref 0.44–1.00)
GFR, Estimated: >60 mL/min (ref >60)
Glucose, Bld: 116 mg/dL — ABNORMAL HIGH (ref 70–99)
Potassium: 3.9 mmol/L (ref 3.5–5.1)
Sodium: 141 mmol/L (ref 135–145)
Total Bilirubin: 0.8 mg/dL (ref 0.0–1.2)
Total Protein: 5.6 g/dL — ABNORMAL LOW (ref 6.5–8.1)

## 2024-02-20 LAB — RETICULOCYTES
Immature Retic Fract: 35.3 % — ABNORMAL HIGH (ref 2.3–15.9)
RBC.: 3.09 MIL/uL — ABNORMAL LOW (ref 3.87–5.11)
Retic Count, Absolute: 215.4 K/uL — ABNORMAL HIGH (ref 19.0–186.0)
Retic Ct Pct: 7 % — ABNORMAL HIGH (ref 0.4–3.1)

## 2024-02-20 LAB — CBC
HCT: 26.5 % — ABNORMAL LOW (ref 36.0–46.0)
Hemoglobin: 8.7 g/dL — ABNORMAL LOW (ref 12.0–15.0)
MCH: 28.2 pg (ref 26.0–34.0)
MCHC: 32.8 g/dL (ref 30.0–36.0)
MCV: 86 fL (ref 80.0–100.0)
Platelets: 343 K/uL (ref 150–400)
RBC: 3.08 MIL/uL — ABNORMAL LOW (ref 3.87–5.11)
RDW: 17.5 % — ABNORMAL HIGH (ref 11.5–15.5)
WBC: 6.8 K/uL (ref 4.0–10.5)
nRBC: 0 % (ref 0.0–0.2)

## 2024-02-20 LAB — URINE DRUG SCREEN, QUALITATIVE (ARMC ONLY)
Amphetamines, Ur Screen: NOT DETECTED
Barbiturates, Ur Screen: NOT DETECTED
Benzodiazepine, Ur Scrn: NOT DETECTED
Cannabinoid 50 Ng, Ur ~~LOC~~: NOT DETECTED
Cocaine Metabolite,Ur ~~LOC~~: NOT DETECTED
MDMA (Ecstasy)Ur Screen: NOT DETECTED
Methadone Scn, Ur: NOT DETECTED
Opiate, Ur Screen: NOT DETECTED
Phencyclidine (PCP) Ur S: NOT DETECTED
Tricyclic, Ur Screen: NOT DETECTED

## 2024-02-20 LAB — FOLATE: Folate: 8.8 ng/mL (ref >5.9)

## 2024-02-20 LAB — FERRITIN: Ferritin: 222 ng/mL (ref 11–307)

## 2024-02-20 LAB — VITAMIN B12: Vitamin B-12: 812 pg/mL (ref 180–914)

## 2024-02-20 LAB — HIV ANTIBODY (ROUTINE TESTING W REFLEX): HIV Screen 4th Generation wRfx: NONREACTIVE

## 2024-02-20 NOTE — Evaluation (Signed)
 Occupational Therapy Evaluation Patient Details Name: Debbie [name] MRN: [id number] DOB: 02-25-1956 Today's Date: 02/20/2024   History of Present Illness   Pt is a 68 yo female that presented to the ED due to seizure-like activity. PMH include cardiac arrest 02/03/2024 including 8 minutes CPR, and stenting after LHC. That hospital admission complicated by aspiration PNA and respiratory failure and intubation, multifactoral shock, and L groin LHC site bleeding/wound healing issues. Also noted for ED presentation 8/13 for seizure like activity as well. Other PMH includes HLD, HTN, MI, PAD.     Clinical Impressions Pt was seen for OT evaluation this date. Prior to hospital admission, pt was living alone and working as a Education officer, environmental, driving, Catering manager., until recent health issues starting in July.  Pt demo bed mobility at MOD I level, STS with independence and no AD use. She performed standing oral care at the sink with no LOB and unilateral support on sink, then performed simulated kitchen tasks, e.g. retrieving items from drawers and cabinets with independence, no LOB. Edu on role of OT, importance of movement, purpose of cardiac rehab and compensatory/energy conservation and safety strategies to utilize upon return home. Pt and daughter verbalized understanding. She has no further acute OT needs and no follow up OT is indicated. Recommend cardiac rehab on DC.      If plan is discharge home, recommend the following:         Functional Status Assessment   Patient has not had a recent decline in their functional status     Equipment Recommendations         Recommendations for Other Services         Precautions/Restrictions   Precautions Precautions: Fall Recall of Precautions/Restrictions: Intact Restrictions Weight Bearing Restrictions Per Provider Order: No     Mobility Bed Mobility Overal bed mobility: Modified Independent                  Transfers Overall transfer  level: Modified independent                 General transfer comment: stood from EOB without AD use and able to perform standing oral care and simulated kitchen tasks via side steps with no LOB      Balance Overall balance assessment: Modified Independent                                         ADL either performed or assessed with clinical judgement   ADL Overall ADL's : Independent;Modified independent                                       General ADL Comments: able to don socks at bed level independently, stood and demo simulated kitchen tasks in room to retrieve items from cabinets and drawers with no LOB and no AD use     Vision         Perception         Praxis         Pertinent Vitals/Pain Pain Assessment Pain Assessment: No/denies pain     Extremity/Trunk Assessment Upper Extremity Assessment Upper Extremity Assessment: Overall WFL for tasks assessed   Lower Extremity Assessment Lower Extremity Assessment: Overall WFL for tasks assessed       Communication Communication Communication: No apparent  difficulties   Cognition Arousal: Alert Behavior During Therapy: WFL for tasks assessed/performed                                 Following commands: Intact       Cueing  General Comments          Exercises Other Exercises Other Exercises: Edu on role of OT, importance of movement, purpose of cardiac rehab and compensatory/energy conservation and safety strategies to utilize upon return home. Pt and daughter verbalized understanding.   Shoulder Instructions      Home Living Family/patient expects to be discharged to:: Private residence Living Arrangements: Alone Available Help at Discharge: Available PRN/intermittently Type of Home: House Home Access: Level entry     Home Layout: Two level;One level               Home Equipment: Agricultural consultant (2 wheels)          Prior  Functioning/Environment Prior Level of Function : Independent/Modified Independent             Mobility Comments: prior to 7/29, independent, working as a Education officer, 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, family present for now (visiting out of state), obtained RW for PRN use      OT Problem List:     OT Treatment/Interventions:        OT Goals(Current goals can be found in the care plan section)       OT Frequency:       Co-evaluation              AM-PAC OT 6 Clicks Daily Activity     Outcome Measure Help from another person eating meals?: None Help from another person taking care of personal grooming?: None Help from another person toileting, which includes using toliet, bedpan, or urinal?: None Help from another person bathing (including washing, rinsing, drying)?: None Help from another person to put on and taking off regular upper body clothing?: None Help from another person to put on and taking off regular lower body clothing?: None 6 Click Score: 24   End of Session    Activity Tolerance: Patient tolerated treatment well Patient left: in bed;with call bell/phone within reach;with family/visitor present  OT Visit Diagnosis: Other abnormalities of gait and mobility (R26.89)                Time: 1537-1600 OT Time Calculation (min): 23 min Charges:  OT General Charges $OT Visit: 1 Visit OT Evaluation $OT Eval Low Complexity: 1 Low OT Treatments $Self Care/Home Management : 8-22 mins Obi Scrima, OTR/L 02/20/24, 5:04 PM  Reverie Vaquera E Exander Shaul 02/20/2024, 5:01 PM

## 2024-02-20 NOTE — Telephone Encounter (Signed)
-----   Message from Bernardino Bring sent at 02/20/2024  1:31 PM EDT ----- Please place future order for CBC in 1 week. Dx: anemia

## 2024-02-20 NOTE — Care Management Important Message (Signed)
 Important Message  Patient Details  Name: Debbie [name] MRN: [id number] Date of Birth: Mar 15, 1956   Important Message Given:        Debbie [name] 02/20/2024, 2:37 PM

## 2024-02-20 NOTE — Plan of Care (Signed)
  Problem: Pain Managment: Goal: General experience of comfort will improve and/or be controlled Outcome: Adequate for Discharge

## 2024-02-20 NOTE — Plan of Care (Signed)
 Cardio notified, per daughter request.  Informed she's here and at bedside - hoping to see them when able. Daughter confirmed,  agreeable to see first available.  Cardio agreed and will round when able.  0945  SW informed, per patient request, to round when able.  Patient has questions about HH.  SW agreed.

## 2024-02-20 NOTE — Discharge Instructions (Signed)
 Food Resources  Agency Name: [facility] Address: [address], [place], [place] [address] Phone: [phone] Website: [email or web address] Service(s) Offered: Housing services, self-sufficiency, congregate meal program, weatherization program, Event organiser program, emergency food assistance,  housing counseling, home ownership program, wheels - to work program.  [employer] free for 60 and older at various locations from [employer], Monday-Friday:  [employer], [address] [place], [phone] -[facility], [address], [place] [phone]  -[facility], [address] [place] [phone]  -[address], [address], [place], [phone]  Agency Name: [employer] Address: [phone] [address], [address], [place], [place] [address] Phone: [phone] Website: www.[email or web address] Service(s) Offered: Home delivered hot, frozen, and emergency  meals. Grocery assistance program which matches  volunteers one-on-one with seniors unable to grocery shop  for themselves. Must be 60 years and older; less than 20  hours of in-home aide service, limited or no driving ability;  live alone or with someone with a disability; live in  [place].  Agency Name: [employer] [facility] [employer]) Address: [address], [place], [place] [address] Phone: [phone] Service(s) Offered: Food is served to shut-ins, homeless, elderly, and low income people in the community every Saturday (11:30 am-12:30 pm) and Sunday (12:30 pm-1:30pm). Volunteers also offer help and encouragement in seeking employment,  and spiritual guidance.  Agency Name: [employer] Address: [address] [place], [place] [address] Phone: [phone] Service(s) Offered: Child support services; child welfare services; food stamps; Medicaid; work first family assistance; and aid  with fuel,  rent, food and medicine.  Agency Name: [employer] Address: [address], [place], [place] Phone: [phone] Website: [email or web address] Services Offered: Monday 10:00am-12:00, 8:00pm-9:00pm, and Friday 10:00am-12:00.  Agency Name: [employer] [facility] Address: [address] [place], [place] [address] Phone: [phone] Website: www.[email or web address] Service(s) Offered: Serves weekday meals, open from 11:30 am- 1:00 pm., and 6:30-7:30pm, Monday-Wednesday-Friday distributes food 3:30-6pm, Monday-Wednesday-Friday.  Agency Name: [facility] Address: [address], [place], [place] Phone: [phone] Website: [email or web address] Services Offered: Distributes food the 4th Saturday of the month, starting at 8:00 am  Agency Name: [facility] Address: [phone] [place], [place] [address] Phone: [phone] Website: [email or web address] Service(s) Offered: Bread of life, weekly food pantry. Open Wednesdays from 10:00am-noon.  Agency Name: [facility] [employer] Address: [address] [place], [place], [place] Phone: [phone] Services Offered: Distributes food 9am-1pm, Monday-Thursday. Call for details.  Agency Name: [facility] Address: [address] [place], [place] [address] Phone: [phone] Website: [email or web address] Service(s) Offered: Games developer. Call for assistance.  Agency Name: [facility] Address: [address], [place], [place] [address] Phone: [phone] Service Offered: Emergency Food Pantry. Call for appointment.  Agency Name: [facility] Address: [address], [place], [place] [address] Phone: [phone] Website: [email or web address] Services Offered: Games developer. Call for details  Agency Name: [facility] Address: [address] [place], [place] Phone:  [phone] Website: [email or web address] Service(s) Offered: Emergency Food Pantry. Call for details.  Agency Name: [employer] representative Address: [address], [place], [place] [address] Phone: [phone] or [phone] Website: [email or web address] Service(s) Offered: Distribute food 9am-11:30 am, Tuesday-Friday, and 1-3:30pm, Monday-Friday. Food pantry Monday-Friday 1pm-3pm, fresh items, Mon.-Wed.-Fri.  Agency Name: [facility] Address: [address] [place], [place] [address] Phone: [phone] Website: [email or web address] Services Offered: Distribute food Tues and Sats from 9:00am-noon.  Closed 1st Saturday of each month. Call for details  Agency Name: [employer] Address: Fayrene Boatman [facility] [address], [place] [address] Phone: [phone]  Services Offered: Delivers meals every Thursday

## 2024-02-20 NOTE — Discharge Summary (Signed)
 Physician Discharge Summary   Patient: Debbie [name] MRN: [id number] DOB: 02/17/1956  Admit date:     02/19/2024  Discharge date: 02/20/24  Discharge Physician: Drue ONEIDA Potter   PCP: Eliverto Bette Hover, MD   Recommendations at discharge:  Follow-up with cardiology  Discharge Diagnoses: Convulsive syncope likely cardiogenic Hypertension HLD CAD s/p STEMI C/B cardiac arrest  Anemia   Hospital Course: Debbie [name] is a 68 y.o. female with medical history significant of hypertension, hyperlipidemia, PAD (s/p right femoral stent 2008, femoral bypass 2013)  with interim history of STEMI s/p PCIx2 02/03/24, c/b cardiac arrest. Patient was discharged on 8/10 and on f/u with pcp had episode of seizure like activity and was referred to ED. Patient in ED had negative CTH.  Was seen the emergency room patient had convulsive syncope and case was discussed by ED physician with neurology Dr. Wilhelmenia Ross who did not recommend any antiepileptic medication given that all the episodes have been aborted in less than 24 hours and patient did not have any postictal state.  Also discussed with neurologist.  Patient was seen by cardiologist and echo did not show any abnormalities.  Patient has been cleared by cardiologist with Zio patch placed and to follow-up closely as an outpatient.   Consultants: Neurology, neurology Procedures performed: None Disposition: Home Diet recommendation:  Cardiac diet DISCHARGE MEDICATION: Allergies as of 02/20/2024       Reactions   Sulfa Antibiotics         Medication List     STOP taking these medications    gabapentin  100 MG capsule Commonly known as: NEURONTIN    hydrochlorothiazide 25 MG tablet Commonly known as: HYDRODIURIL   prazosin 1 MG capsule Commonly known as: MINIPRESS   tiZANidine  2 MG tablet Commonly known as: ZANAFLEX    valsartan 80 MG tablet Commonly known as: DIOVAN       TAKE these medications    aspirin  EC 81 MG tablet Take 1  tablet by mouth daily.   docusate sodium  100 MG capsule Commonly known as: COLACE Take 100 mg by mouth 2 (two) times daily.   HYDROcodone -acetaminophen  5-325 MG tablet Commonly known as: NORCO/VICODIN Take 1 tablet by mouth every 6 (six) hours as needed.   hydrOXYzine  25 MG tablet Commonly known as: ATARAX  Take 25 mg by mouth 3 (three) times daily as needed (for anxiety).   metoprolol  succinate 25 MG 24 hr tablet Commonly known as: TOPROL -XL Take 25 mg by mouth daily.   mupirocin ointment 2 % Commonly known as: BACTROBAN Apply 1 Application topically 2 (two) times daily.   pantoprazole  40 MG tablet Commonly known as: PROTONIX  Take 40 mg by mouth daily before breakfast.   rosuvastatin  40 MG tablet Commonly known as: CRESTOR  Take 40 mg by mouth at bedtime.   ticagrelor  90 MG Tabs tablet Commonly known as: BRILINTA  Take 90 mg by mouth 2 (two) times daily.   Zepbound  15 MG/0.5ML Pen Generic drug: tirzepatide  Inject 15 mg into the skin once a week.        Discharge Exam:  ENMT: Mucous membranes are moist. Posterior pharynx clear of any exudate or lesions.Normal dentition.  Neck: normal, supple, no masses, no thyromegaly Respiratory: clear to auscultation bilaterally, no wheezing, no crackles. Normal respiratory effort. No accessory muscle use.  Cardiovascular: Regular rate and rhythm, no murmurs / rubs / gallops. No extremity edema. 2+ pedal pulses.   Abdomen: no tenderness, no masses palpated. No hepatosplenomegaly. Bowel sounds positive.  Musculoskeletal: no clubbing / cyanosis. No  joint deformity upper and lower extremities. Good ROM, no contractures. Normal muscle tone.  Skin: no rashes, lesions, ulcers. No induration Neurologic: CN grossly intact. Sensation intact, Strength 5/5 in all 4.  Psychiatric: Normal judgment and insight. Alert and oriented x 3. Normal mood.     Condition at discharge: good  The results of significant diagnostics from this  hospitalization (including imaging, microbiology, ancillary and laboratory) are listed below for reference.   Imaging Studies: ECHOCARDIOGRAM COMPLETE Result Date: 02/20/2024    ECHOCARDIOGRAM REPORT   Patient Name:   Debbie [name] Date of Exam: 02/20/2024 Medical Rec #:  [id number]     Height:       65.5 in Accession #:    [id number]    Weight:       218.4 lb Date of Birth:  August 31, 1955      BSA:          2.065 m Patient Age:    68 years      BP:           108/87 mmHg Patient Gender: F             HR:           74 bpm. Exam Location:  [facility] Procedure: 2D Echo, Cardiac Doppler and Color Doppler (Both Spectral and Color            Flow Doppler were utilized during procedure). Indications:     Syncope  History:         Patient has no prior history of Echocardiogram examinations.                  Risk Factors:Dyslipidemia and Hypertension.  Sonographer:     Philomena Daring Referring Phys:  [id number] A THOMAS Diagnosing Phys: Deatrice Cage MD IMPRESSIONS  1. Left ventricular ejection fraction, by estimation, is 60 to 65%. The left ventricle has normal function. The left ventricle has no regional wall motion abnormalities. There is mild left ventricular hypertrophy. Left ventricular diastolic parameters were normal.  2. Right ventricular systolic function is normal. The right ventricular size is normal. There is normal pulmonary artery systolic pressure.  3. The mitral valve is normal in structure. No evidence of mitral valve regurgitation. No evidence of mitral stenosis.  4. The aortic valve is normal in structure. Aortic valve regurgitation is not visualized. Aortic valve sclerosis is present, with no evidence of aortic valve stenosis.  5. The inferior vena cava is normal in size with greater than 50% respiratory variability, suggesting right atrial pressure of 3 mmHg. FINDINGS  Left Ventricle: Left ventricular ejection fraction, by estimation, is 60 to 65%. The left ventricle has normal function. The left  ventricle has no regional wall motion abnormalities. The left ventricular internal cavity size was normal in size. There is  mild left ventricular hypertrophy. Left ventricular diastolic parameters were normal. Right Ventricle: The right ventricular size is normal. No increase in right ventricular wall thickness. Right ventricular systolic function is normal. There is normal pulmonary artery systolic pressure. The tricuspid regurgitant velocity is 1.48 m/s, and  with an assumed right atrial pressure of 3 mmHg, the estimated right ventricular systolic pressure is 11.8 mmHg. Left Atrium: Left atrial size was normal in size. Right Atrium: Right atrial size was normal in size. Pericardium: There is no evidence of pericardial effusion. Mitral Valve: The mitral valve is normal in structure. No evidence of mitral valve regurgitation. No evidence of mitral valve stenosis. Tricuspid Valve: The tricuspid valve  is normal in structure. Tricuspid valve regurgitation is trivial. No evidence of tricuspid stenosis. Aortic Valve: The aortic valve is normal in structure. Aortic valve regurgitation is not visualized. Aortic valve sclerosis is present, with no evidence of aortic valve stenosis. Pulmonic Valve: The pulmonic valve was normal in structure. Pulmonic valve regurgitation is mild to moderate. No evidence of pulmonic stenosis. Aorta: The aortic root is normal in size and structure. Venous: The inferior vena cava is normal in size with greater than 50% respiratory variability, suggesting right atrial pressure of 3 mmHg. IAS/Shunts: No atrial level shunt detected by color flow Doppler.  LEFT VENTRICLE PLAX 2D LVIDd:         4.40 cm     Diastology LVIDs:         2.80 cm     LV e' medial:    10.00 cm/s LV PW:         1.30 cm     LV E/e' medial:  9.0 LV IVS:        1.20 cm     LV e' lateral:   12.10 cm/s LVOT diam:     2.00 cm     LV E/e' lateral: 7.4 LV SV:         75 LV SV Index:   37 LVOT Area:     3.14 cm  LV Volumes (MOD) LV  vol d, MOD A2C: 81.1 ml LV vol d, MOD A4C: 96.7 ml LV vol s, MOD A2C: 29.2 ml LV vol s, MOD A4C: 34.6 ml LV SV MOD A2C:     51.9 ml LV SV MOD A4C:     96.7 ml LV SV MOD BP:      59.1 ml RIGHT VENTRICLE             IVC RV S prime:     14.80 cm/s  IVC diam: 1.40 cm TAPSE (M-mode): 2.7 cm LEFT ATRIUM             Index        RIGHT ATRIUM           Index LA diam:        3.00 cm 1.45 cm/m   RA Area:     13.80 cm LA Vol (A2C):   45.7 ml 22.13 ml/m  RA Volume:   26.90 ml  13.03 ml/m LA Vol (A4C):   33.6 ml 16.27 ml/m LA Biplane Vol: 40.4 ml 19.56 ml/m  AORTIC VALVE             PULMONIC VALVE LVOT Vmax:   118.00 cm/s PR End Diast Vel: 1.57 msec LVOT Vmean:  76.000 cm/s LVOT VTI:    0.240 m  AORTA Ao Root diam: 2.80 cm MITRAL VALVE                TRICUSPID VALVE MV Area (PHT): 4.10 cm     TR Peak grad:   8.8 mmHg MV Decel Time: 185 msec     TR Vmax:        148.00 cm/s MV E velocity: 89.60 cm/s MV A velocity: 106.00 cm/s  SHUNTS MV E/A ratio:  0.85         Systemic VTI:  0.24 m                             Systemic Diam: 2.00 cm Deatrice Cage MD Electronically signed by Deatrice Cage MD Signature Date/Time: 02/20/2024/12:49:19 PM  Final    MR BRAIN WO CONTRAST Result Date: 02/19/2024 CLINICAL DATA:  Syncope/presyncope, cerebrovascular cause suspected EXAM: MRI HEAD WITHOUT CONTRAST TECHNIQUE: Multiplanar, multiecho pulse sequences of the brain and surrounding structures were obtained without intravenous contrast. COMPARISON:  Ct head 02/18/24 FINDINGS: Brain: No acute infarction, hemorrhage, hydrocephalus, extra-axial collection or mass lesion. Vascular: Normal flow voids. Skull and upper cervical spine: Normal marrow signal. Sinuses/Orbits: Negative. IMPRESSION: Normal brain MRI for patient age.  No acute abnormality. Electronically Signed   By: Gilmore GORMAN Molt M.D.   On: 02/19/2024 16:42   CT Angio Chest Pulmonary Embolism (PE) W or WO Contrast Result Date: 02/19/2024 CLINICAL DATA:  Syncope. EXAM: CT  ANGIOGRAPHY CHEST WITH CONTRAST TECHNIQUE: Multidetector CT imaging of the chest was performed using the standard protocol during bolus administration of intravenous contrast. Multiplanar CT image reconstructions and MIPs were obtained to evaluate the vascular anatomy. RADIATION DOSE REDUCTION: This exam was performed according to the departmental dose-optimization program which includes automated exposure control, adjustment of the mA and/or kV according to patient size and/or use of iterative reconstruction technique. CONTRAST:  75mL OMNIPAQUE  IOHEXOL  350 MG/ML SOLN COMPARISON:  None Available. FINDINGS: Cardiovascular: Satisfactory opacification of the pulmonary arteries to the proximal segmental level. No evidence of pulmonary embolism. Normal heart size. No pericardial effusion. Retropharyngeal course of the right brachiocephalic artery, anatomic variant. Coronary artery calcifications. Normal size heart. No significant pericardial effusion/thickening. Mediastinum/Nodes: 13 mm nodule in the right lobe of the thyroid, this requires no independent imaging follow-up. Prominent right paratracheal lymph node measures 7 mm in short axis on image 41/4. The esophagus is grossly unremarkable. Lungs/Pleura: Consolidations in the posterior lingula and bilateral lower lobes demonstrate a linear morphology compatible with atelectasis. Central airways are patent. No pleural effusion. No pneumothorax. Upper Abdomen: No acute abnormality. Musculoskeletal: Multilevel degenerative change of the spine. Minimally displaced left second, third lateral rib fractures. Minimally displaced right second and third lateral rib fractures. Review of the MIP images confirms the above findings. IMPRESSION: 1. No evidence of pulmonary embolism. 2. Consolidations in the posterior lingula and bilateral lower lobes demonstrate a linear morphology compatible with atelectasis. 3. Coronary artery calcifications. 4. Minimally displaced bilateral  second and third lateral rib fractures. Electronically Signed   By: Reyes Holder M.D.   On: 02/19/2024 16:20   DG Chest Port 1 View Result Date: 02/18/2024 CLINICAL DATA:  Recent history of heart attack, cardiac arrest and seizure sent for follow-up examination. EXAM: PORTABLE CHEST 1 VIEW COMPARISON:  None Available. FINDINGS: The heart size and mediastinal contours are within normal limits. There is marked severity calcification of the aortic arch. Low lung volumes are noted. Mild atelectasis and/or infiltrate is seen within the left lung base. A chronic appearing deformity is seen involving the distal right clavicle. Degenerative changes are seen throughout the thoracic spine. IMPRESSION: Low lung volumes with mild left basilar atelectasis and/or infiltrate. Electronically Signed   By: Suzen Dials M.D.   On: 02/18/2024 19:10   CT Head Wo Contrast Result Date: 02/18/2024 EXAM: CT HEAD WITHOUT CONTRAST 02/18/2024 04:11:47 PM TECHNIQUE: CT of the head was performed without the administration of intravenous contrast. Automated exposure control, iterative reconstruction, and/or weight based adjustment of the mA/kV was utilized to reduce the radiation dose to as low as reasonably achievable. COMPARISON: CT head 07/07/2022 CLINICAL HISTORY: Seizure, new-onset, no history of trauma. Recent cardiac arrest. FINDINGS: BRAIN AND VENTRICLES: No acute hemorrhage. Gray-white differentiation is preserved. No hydrocephalus. No extra-axial collection. No mass effect or  midline shift. Cerebral white matter hypodensities are similar to the prior CT and nonspecific but compatible with mild chronic small vessel ischemic disease. Mild generalized cerebral atrophy is within normal limits for age. ORBITS: No acute abnormality. SINUSES: No acute abnormality. SOFT TISSUES AND SKULL: No acute soft tissue abnormality. No skull fracture. Calcified atherosclerosis at the skull base. IMPRESSION: 1. No acute intracranial  abnormality. 2. Mild chronic small vessel ischemic disease. Electronically signed by: Dasie Hamburg MD 02/18/2024 05:30 PM EDT RP Workstation: [id number]    Microbiology: Results for orders placed or performed during the hospital encounter of 02/19/24  Respiratory (~20 pathogens) panel by PCR     Status: None   Collection Time: 02/19/24  1:27 PM   Specimen: Nasopharyngeal Swab; Respiratory  Result Value Ref Range Status   Adenovirus NOT DETECTED NOT DETECTED Final   Coronavirus 229E NOT DETECTED NOT DETECTED Final    Comment: (NOTE) The Coronavirus on the Respiratory Panel, DOES NOT test for the novel  Coronavirus (2019 nCoV)    Coronavirus HKU1 NOT DETECTED NOT DETECTED Final   Coronavirus NL63 NOT DETECTED NOT DETECTED Final   Coronavirus OC43 NOT DETECTED NOT DETECTED Final   Metapneumovirus NOT DETECTED NOT DETECTED Final   Rhinovirus / Enterovirus NOT DETECTED NOT DETECTED Final   Influenza A NOT DETECTED NOT DETECTED Final   Influenza B NOT DETECTED NOT DETECTED Final   Parainfluenza Virus 1 NOT DETECTED NOT DETECTED Final   Parainfluenza Virus 2 NOT DETECTED NOT DETECTED Final   Parainfluenza Virus 3 NOT DETECTED NOT DETECTED Final   Parainfluenza Virus 4 NOT DETECTED NOT DETECTED Final   Respiratory Syncytial Virus NOT DETECTED NOT DETECTED Final   Bordetella pertussis NOT DETECTED NOT DETECTED Final   Bordetella Parapertussis NOT DETECTED NOT DETECTED Final   Chlamydophila pneumoniae NOT DETECTED NOT DETECTED Final   Mycoplasma pneumoniae NOT DETECTED NOT DETECTED Final    Comment: Performed at [facility], [address], [place], [place] [address]    Labs: CBC: Recent Labs  Lab 02/18/24 1556 02/19/24 1017 02/20/24 0323  WBC 8.3 7.4 6.8  NEUTROABS 5.5  --   --   HGB 9.4* 9.7* 8.7*  HCT 29.6* 30.4* 26.5*  MCV 87.8 86.4 86.0  PLT 377 375 343   Basic Metabolic Panel: Recent Labs  Lab 02/18/24 1556 02/19/24 1017 02/20/24 0323  NA 138 139 141  K 3.3*  3.5 3.9  CL 106 105 108  CO2 23 24 22   GLUCOSE 147* 172* 116*  BUN 28* 23 20  CREATININE 0.69 0.90 0.83  CALCIUM  8.9 9.3 8.8*  MG  --  1.9  --    Liver Function Tests: Recent Labs  Lab 02/18/24 1556 02/19/24 1017 02/20/24 0323  AST 41 41 32  ALT 38 40 32  ALKPHOS 70 75 74  BILITOT 1.5* 1.3* 0.8  PROT 6.4* 6.2* 5.6*  ALBUMIN 2.9* 3.1* 2.6*   CBG: No results for input(s): GLUCAP in the last 168 hours.  Discharge time spent:  35 minutes.  Signed: Drue ONEIDA Potter, MD [facility] 02/20/2024

## 2024-02-20 NOTE — TOC CM/SW Note (Signed)
 Transition of Care [facility]) - Inpatient Brief Assessment   Patient Details  Name: Debbie [name] MRN: [id number] Date of Birth: December 25, 1955  [facility]) CM/SW Contact:    Lauraine JAYSON Carpen, LCSW Phone Number: 02/20/2024, 4:51 PM   Clinical Narrative: Patient has orders to discharge home today. Chart reviewed. SDOH flag for food insecurity. Resources added to AVS. PT recommending cardiac rehab. Cardiology sent referral. No further concerns. CSW signing off.  Transition of Care Asessment: Insurance and Status: Insurance coverage has been reviewed Patient has primary care physician: Yes Home environment has been reviewed: Single family home Prior level of function:: Independent Prior/Current Home Services: No current home services Social Drivers of Health Review: SDOH reviewed interventions complete Readmission risk has been reviewed: Yes Transition of care needs: no transition of care needs at this time

## 2024-02-20 NOTE — Consult Note (Signed)
 Cardiology Consultation:   Patient ID: Debbie [name]; [id number]; 22-Mar-1956   Admit date: 02/19/2024 Date of Consult: 02/20/2024  Primary Care Provider: Eliverto Bette Hover, MD Primary Cardiologist: Agbor-Etang Primary Electrophysiologist:  None   Patient Profile:   Debbie [name] is a 68 y.o. female with a hx of CAD with recent inferior ST elevation MI in the setting of outpatient cardiac arrest s/p PCI/DES to mid LCx, OM1 and PTCA to OM2 complicated by multifactorial shock including cardiogenic and hypovolemic, presumed aspiration pneumonia with acute hypoxic respiratory failure requiring mechanical ventilation, right femoral arteriotomy site complication with significant ecchymosis with transfusion-dependent anemia, also with history of colon cancer status post partial colectomy in 08/2021, PAD s/p prior right femoral artery stenting and femoral bypass, HTN, HLD, and obesity who is being seen today for the evaluation of possible syncope at the request of Dr. Debby.  History of Present Illness:   [name] has a history of CAD that dates back to at least 2013 with LHC at that time showing nonobstructive disease.  She was evaluated in 2024 for dyspnea by [facility] cardiology and [facility] cardiology.  Subsequent myocardial PET/CT in 11/2022 that was consistent with ischemia in the mid LCx/OM distribution with normal LV systolic function.  Further follow-up at that time is unclear.  On 02/03/2024, in [place], [place] , she suffered out-of-hospital cardiac arrest with CPR starting 8 minutes after unresponsiveness lasting for 30 minutes prior to ROSC and required 1 defibrillation for V-fib.  She underwent emergent LHC at [facility] in [place] with subsequent PCI/DES to the mid LCx and OM1 and PTCA to OM 2.  Admission was further complicated by multifactorial shock including cardiogenic and hypovolemic requiring vasopressor support as well as complication with right femoral arteriotomy site closure  leading to significant ecchymosis and transfusion dependent anemia (transfused 2 units PRBC), presumed aspiration pneumonia with acute hypoxic respiratory failure requiring mechanical ventilation, AMS, transient bradycardia, AKI, electrolyte derangement and abnormal LFT.  EF during admission unknown (not included in discharge summary or faxed paperwork).  Patient was discharged home on DAPT with aspirin  81 mg, ticagrelor  90 mg twice daily, HCTZ, valsartan, metoprolol , and rosuvastatin  as well as with gabapentin , Zanaflex , and hydrocodone .  She followed up with her PCP on 8/13 and had an episode of seizure-like activity without postictal period and was transferred to the ED.  Workup at that time showed negative high-sensitivity troponin x 2, BNP 59, EKG was sinus rhythm with PACs and no acute ischemic changes.  CT head showed no acute intracranial abnormality.  Chest x-ray with low lung volumes with mild left basilar atelectasis versus infiltrate.  Patient was discharged from the ED to cardiology follow-up.  She presented to our office on 02/19/2024 and while being triaged in the room she again had an episode of seizure-like activity with urinary incontinence.  During this episode her arms seemed to tense and she was out for about 1 to 2 minutes.  Systolic blood pressure 112 with a heart rate of 87 bpm immediately following episode.  No postictal period.  During these episodes the patient has denied symptoms of chest pain, dyspnea, or palpitations.  She indicates that she can feel them coming on though cannot describe this any further.  She again had an episode and route to the ED from our office and in the ED waiting room/triage.  She was not on the cardiac monitor during either of these episodes.  In the ER blood pressure has ranged from the 80s to 120s mmHg systolic.  EKG again showed sinus rhythm with PACs without acute ischemic changes.  High-sensitivity troponin negative.  BNP 64.  CTA of the chest  negative for PE with consolidations in the posterior lingula and bilateral lower lobes compatible with atelectasis as well as coronary calcification and minimally displaced bilateral 2nd and 3rd lateral rib fractures.  MRI of the brain showed no acute abnormality.  In further review of the patient's labs, her hemoglobin has trended from a baseline around 13-14 to a current value downtrending from 9.4-8.7.  She denies any hematochezia, melena, hemoptysis, hematuria, or hematemesis.  She does report significant bruising along the upper portion of her right leg that has been present since undergoing cardiac cath last month.    Past Medical History:  Diagnosis Date   Genital warts    Hyperlipidemia    Hypertension    Myocardial infarction [facility])    Peripheral arterial disease (HCC)    Status post angioplasty with stent    Known femoral stent occlusion    Past Surgical History:  Procedure Laterality Date   ABDOMINAL AORTAGRAM N/A 08/09/2011   Procedure: ABDOMINAL EZELLA;  Surgeon: Carlin FORBES Haddock, MD;  Location: [facility] CATH LAB;  Service: Cardiovascular;  Laterality: N/A;   ANGIOPLASTY / STENTING FEMORAL  2008   Right femoral artery stenting done in [place]    COLONOSCOPY WITH PROPOFOL  N/A 06/11/2021   Procedure: COLONOSCOPY WITH PROPOFOL ;  Surgeon: Therisa Bi, MD;  Location: [facility] ENDOSCOPY;  Service: Gastroenterology;  Laterality: N/A;  2ND ARRIVAL, PLEASE   FEMORAL BYPASS Right 08/2011   Dr. Sherre at [facility] Meds: Prior to Admission medications   Medication Sig Start Date End Date Taking? Authorizing Provider  aspirin  81 MG EC tablet Take 1 tablet by mouth daily.   Yes [provider]  docusate sodium  (COLACE) 100 MG capsule Take 100 mg by mouth 2 (two) times daily.   Yes [provider]  gabapentin  (NEURONTIN ) 100 MG capsule Take 1-3 capsules (100-300 mg total) by mouth 3 (three) times daily as needed. 01/30/24  Yes Corey, Evan S, MD  hydrochlorothiazide (HYDRODIURIL)  25 MG tablet Take 25 mg by mouth daily.   Yes [provider]  hydrOXYzine  (ATARAX ) 25 MG tablet Take 25 mg by mouth 3 (three) times daily as needed (for anxiety).   Yes [provider]  metoprolol  succinate (TOPROL -XL) 25 MG 24 hr tablet Take 25 mg by mouth daily.   Yes [provider]  mupirocin ointment (BACTROBAN) 2 % Apply 1 Application topically 2 (two) times daily. 02/15/24  Yes [provider]  pantoprazole  (PROTONIX ) 40 MG tablet Take 40 mg by mouth daily before breakfast.   Yes [provider]  prazosin (MINIPRESS) 1 MG capsule Take 1 mg by mouth 2 (two) times daily. 02/15/24  Yes [provider]  rosuvastatin  (CRESTOR ) 40 MG tablet Take 40 mg by mouth at bedtime.   Yes [provider]  ticagrelor  (BRILINTA ) 90 MG TABS tablet Take 90 mg by mouth 2 (two) times daily.   Yes [provider]  tirzepatide  (ZEPBOUND ) 15 MG/0.5ML Pen Inject 15 mg into the skin once a week. 07/22/23  Yes   tiZANidine  (ZANAFLEX ) 2 MG tablet Take 1-2 tablets (2-4 mg total) by mouth every 8 (eight) hours as needed. 01/23/24  Yes Corey, Evan S, MD  valsartan (DIOVAN) 80 MG tablet Take 80 mg by mouth daily.   Yes [provider]  HYDROcodone -acetaminophen  (NORCO/VICODIN) 5-325 MG tablet Take 1 tablet by mouth  every 6 (six) hours as needed. 01/29/24   Corey, Evan S, MD    Inpatient Medications: Scheduled Meds:  aspirin  EC  81 mg Oral Daily   docusate sodium   100 mg Oral BID   metoprolol  succinate  25 mg Oral Daily   pantoprazole   40 mg Oral QAC breakfast   rosuvastatin   40 mg Oral QHS   ticagrelor   90 mg Oral BID   Continuous Infusions:  sodium chloride  75 mL/hr at 02/20/24 0130   PRN Meds: acetaminophen  **OR** acetaminophen , albuterol , HYDROcodone -acetaminophen , hydrOXYzine , ondansetron  **OR** ondansetron  (ZOFRAN ) IV  Allergies:   Allergies  Allergen Reactions   Sulfa Antibiotics     Social History:   Social History    Socioeconomic History   Marital status: Single    Spouse name: Not on file   Number of children: Not on file   Years of education: Not on file   Highest education level: Not on file  Occupational History   Not on file  Tobacco Use   Smoking status: Never   Smokeless tobacco: Never  Vaping Use   Vaping status: Never Used  Substance and Sexual Activity   Alcohol use: No   Drug use: No   Sexual activity: Not Currently    Birth control/protection: Post-menopausal  Other Topics Concern   Not on file  Social History Narrative   Not on file   Social Drivers of Health   Financial Resource Strain: Low Risk  (06/02/2023)   Received from [facility]   Overall Financial Resource Strain (CARDIA)    Difficulty of Paying Living Expenses: Not hard at all  Food Insecurity: Food Insecurity Present (06/02/2023)   Received from [facility]   Hunger Vital Sign    Within the past 12 months, you worried that your food would run out before you got the money to buy more.: Never true    Within the past 12 months, the food you bought just didn't last and you didn't have money to get more.: Sometimes true  Transportation Needs: No Transportation Needs (06/02/2023)   Received from [facility] - Transportation    In the past 12 months, has lack of transportation kept you from medical appointments or from getting medications?: No    Lack of Transportation (Non-Medical): No  Physical Activity: Not on file  Stress: Not on file  Social Connections: Not on file  Intimate Partner Violence: Not on file     Family History:   Family History  Problem Relation Age of Onset   Uterine cancer Mother    Diabetes Sister    Hypertension Sister    Breast cancer Maternal Aunt    Stroke Maternal Uncle     ROS:  Review of Systems  Constitutional:  Positive for malaise/fatigue. Negative for chills, diaphoresis, fever and weight loss.  HENT:   Negative for congestion.   Eyes:  Negative for discharge and redness.  Respiratory:  Negative for cough, sputum production, shortness of breath and wheezing.   Cardiovascular:  Negative for chest pain, palpitations, orthopnea, claudication, leg swelling and PND.  Gastrointestinal:  Negative for abdominal pain, heartburn, nausea and vomiting.  Musculoskeletal:  Negative for falls and myalgias.  Skin:  Negative for rash.  Neurological:  Positive for seizures, loss of consciousness and weakness. Negative for dizziness, tingling, tremors, sensory change, speech change and focal weakness.  Endo/Heme/Allergies:  Bruises/bleeds easily.       Bruise along left flank and lower  extremity  Psychiatric/Behavioral:  Negative for substance abuse. The patient is not nervous/anxious.   All other systems reviewed and are negative.     Physical Exam/Data:   Vitals:   02/19/24 2117 02/20/24 0442 02/20/24 0734 02/20/24 1129  BP: (!) 111/40 (!) 107/52 126/61 108/87  Pulse: 75 81 83 76  Resp: 14 14 20 18   Temp:  98.2 F (36.8 C) 98.8 F (37.1 C) 98.5 F (36.9 C)  TempSrc:  Oral    SpO2: 100% 99% 100% 100%  Height:        Intake/Output Summary (Last 24 hours) at 02/20/2024 1137 Last data filed at 02/20/2024 1015 Gross per 24 hour  Intake 711.83 ml  Output --  Net 711.83 ml   There were no vitals filed for this visit. Body mass index is 35.79 kg/m.   Physical Exam: General: Well developed, well nourished, in no acute distress. Head: Normocephalic, dried blood along the right lateral vermilion border, sclera non-icteric, no xanthomas, nares without discharge.  Neck: Negative for carotid bruits. JVD difficult to assess secondary to body habitus. Lungs: Diminished breath sounds along the bilateral bases. Breathing is unlabored. Heart: RRR with S1 S2. I/VI systolic murmur, no rubs, or gallops appreciated. Abdomen: Soft, non-tender, non-distended with normoactive bowel sounds. No hepatomegaly. No  rebound/guarding. No obvious abdominal masses. Msk:  Strength and tone appear normal for age. Extremities: No clubbing or cyanosis. No edema. Distal pedal pulses are 2+ and equal bilaterally.  Significant ecchymosis involving the medial portion of the right lower extremity and extends laterally to the right flank.  No femoral bruit auscultated.  Ecchymosis is soft. Neuro: Alert and oriented X 3. No facial asymmetry. No focal deficit. Moves all extremities spontaneously. Psych:  Responds to questions appropriately with a normal affect.   EKG:  The EKG was personally reviewed and demonstrates: NSR, 86 bpm, poor R wave progression along the precordial leads, occasional PACs Telemetry:  Telemetry was personally reviewed and demonstrates: SR with PACs, rates 70s to 80s bpm  Weights: There were no vitals filed for this visit.  Relevant CV Studies:  LHC 09/22/2021: 40% dLAD, 50% pLCx, 50% OM2, 90% raums, 20% pRCA, 30% dRCA, 70% PDA  __________  Myocardial PET/CT 11/20/2022: FINAL COMMENTS   Non-diagnostic CT obtained for attenuation correction: Coronary artery calcifications are present. Aortic atherosclerosis.    1. Myocardial perfusion is abnormal with evidence of ischemia in the mid LCX/OM coronary distribution.   2. Global myocardial blood flow reserve is normal. There is relative reduction of flow in the mid LCX distribution.   3. Normal left ventricular systolic function.   Laboratory Data:  Chemistry Recent Labs  Lab 02/18/24 1556 02/19/24 1017 02/20/24 0323  NA 138 139 141  K 3.3* 3.5 3.9  CL 106 105 108  CO2 23 24 22   GLUCOSE 147* 172* 116*  BUN 28* 23 20  CREATININE 0.69 0.90 0.83  CALCIUM  8.9 9.3 8.8*  GFRNONAA >60 >60 >60  ANIONGAP 9 10 11     Recent Labs  Lab 02/18/24 1556 02/19/24 1017 02/20/24 0323  PROT 6.4* 6.2* 5.6*  ALBUMIN 2.9* 3.1* 2.6*  AST 41 41 32  ALT 38 40 32  ALKPHOS 70 75 74  BILITOT 1.5* 1.3* 0.8   Hematology Recent Labs  Lab  02/18/24 1556 02/19/24 1017 02/20/24 0323  WBC 8.3 7.4 6.8  RBC 3.37* 3.52* 3.08*  3.09*  HGB 9.4* 9.7* 8.7*  HCT 29.6* 30.4* 26.5*  MCV 87.8 86.4 86.0  MCH 27.9 27.6  28.2  MCHC 31.8 31.9 32.8  RDW 17.0* 17.3* 17.5*  PLT 377 375 343   Cardiac EnzymesNo results for input(s): TROPONINI in the last 168 hours. No results for input(s): TROPIPOC in the last 168 hours.  BNP Recent Labs  Lab 02/18/24 1555 02/19/24 1017  BNP 59.6 64.1    DDimer No results for input(s): DDIMER in the last 168 hours.  Radiology/Studies:  MR BRAIN WO CONTRAST Result Date: 02/19/2024 IMPRESSION: Normal brain MRI for patient age.  No acute abnormality. Electronically Signed   By: Gilmore GORMAN Molt M.D.   On: 02/19/2024 16:42   CT Angio Chest Pulmonary Embolism (PE) W or WO Contrast Result Date: 02/19/2024 IMPRESSION: 1. No evidence of pulmonary embolism. 2. Consolidations in the posterior lingula and bilateral lower lobes demonstrate a linear morphology compatible with atelectasis. 3. Coronary artery calcifications. 4. Minimally displaced bilateral second and third lateral rib fractures. Electronically Signed   By: Reyes Holder M.D.   On: 02/19/2024 16:20   DG Chest Port 1 View Result Date: 02/18/2024 IMPRESSION: Low lung volumes with mild left basilar atelectasis and/or infiltrate. Electronically Signed   By: Suzen Dials M.D.   On: 02/18/2024 19:10   CT Head Wo Contrast Result Date: 02/18/2024 IMPRESSION: 1. No acute intracranial abnormality. 2. Mild chronic small vessel ischemic disease. Electronically signed by: Dasie Hamburg MD 02/18/2024 05:30 PM EDT RP Workstation: [id number]    Assessment and Plan:   1.  CAD involving the native coronary arteries with recent inferior ST elevation MI complicated by out-of-hospital cardiac arrest on 02/03/2024: - Currently without symptoms of angina or cardiac decompensation - Status post PCI/DES to mid LCx and OM1 with PTCA to OM 2 - Continue DAPT with  aspirin  81 mg daily anticoagulant 90 mg twice daily - Obtain echo (EF at time of MI uncertain) - Aggressive risk factor modification and secondary prevention including rosuvastatin  40 mg and Toprol -XL 25 mg - She reports rotational chest discomfort that has been present since her cardiac arrest and is likely secondary to CPR with rib fractures noted on CT imaging - Will need cardiac rehab  2.  Syncope versus seizure: - Unclear at this time if this is frank syncope versus alternative process, no clear postictal period - It is certainly possible these episodes are arrhythmogenic given her recent cardiac arrest and inferior ST elevation MI - She has not had any further episodes since being on cardiac monitoring - If no significant arrhythmia is identified on cardiac monitoring while admitted she will need to be discharged with ZIO AT - Per Levy guidelines, no driving for minimum of 6 months from date of most recent episode or until identifiable cause is adequately treated - Pseudoseizure and polypharmacy may also be on the differential - MRI of the brain without anoxic brain injury - Consider neurology evaluation  3.  Recent cardiogenic/hypovolemic shock with recent aspiration pneumonia requiring mechanical ventilation: - Hemodynamically stable at this time - Obtain echo as above  4.  Acute blood loss anemia: - Denies symptoms of bleeding, likely in the setting of significant ecchymosis along the right femoral arteriotomy site without bruit noted on exam - If hemoglobin continues to downtrend, would pursue CT of the abdomen/pelvis for further evaluation - Maintain hemoglobin greater than 8 - Unable to interrupt DAPT with recent PCI  5.  HTN: - Blood pressure stable - Toprol -XL as outlined above  6.  HLD: - LDL 150 in 11/2023 with target LDL less than 55 - PTA rosuvastatin  40  mg with recommendation to escalate lipid-lowering therapy in the outpatient setting as indicated to achieve target  LDL        For questions or updates, please contact [facility] Please consult [email or web address] for contact info under Cardiology/STEMI.   Signed, Bernardino Bring, PA-C [facility] Pager: [phone] 02/20/2024, 11:37 AM

## 2024-02-20 NOTE — Evaluation (Signed)
 Physical Therapy Evaluation Patient Details Name: Debbie [name] MRN: [id number] DOB: 06-29-1956 Today's Date: 02/20/2024  History of Present Illness  Pt is a 68 yo female that presented to the ED due to seizure-like activity. PMH include cardiac arrest 02/03/2024 including 8 minutes CPR, and stenting after LHC. That hospital admission complicated by aspiration PNA and respiratory failure and intubation, multifactoral shock, and L groin LHC site bleeding/wound healing issues. Also noted for ED presentation 8/13 for seizure like activity as well. Other PMH includes HLD, HTN, MI, PAD.   Clinical Impression  Patient alert, agreeable to PT, A&Ox4, denied pain. At baseline the pt is independent, lives alone, family currently in town visiting. Obtained a RW after recent hospital admissions to use PRN, works as a Printmaker.   She was able to perform all mobility at a supervision level. Ambulated with and without RW, PT/pt agreed for use of RW as PRN if pt felt it was needed. Reviewed safety education for discharge home. No LOB noted, balance appeared WFLs. The patient demonstrated and reported return to baseline level of functioning, no further acute PT needs indicated. Recommend follow up with cardiac rehab.         If plan is discharge home, recommend the following: Assist for transportation;Help with stairs or ramp for entrance   Can travel by private vehicle        Equipment Recommendations None recommended by PT  Recommendations for Other Services       Functional Status Assessment       Precautions / Restrictions Precautions Precautions: Fall Recall of Precautions/Restrictions: Intact Restrictions Weight Bearing Restrictions Per Provider Order: No      Mobility  Bed Mobility Overal bed mobility: Needs Assistance Bed Mobility: Supine to Sit     Supine to sit: Supervision          Transfers   Equipment used: Rolling walker (2 wheels)                     Ambulation/Gait Ambulation/Gait assistance: Supervision Gait Distance (Feet): 180 Feet Assistive device: Rolling walker (2 wheels), None         General Gait Details: able to ambulate with and without RW, PT informed pt is able to choose to use device if desired  Stairs            Wheelchair Mobility     Tilt Bed    Modified Rankin (Stroke Patients Only)       Balance Overall balance assessment: Mild deficits observed, not formally tested Sitting-balance support: Feet supported Sitting balance-Leahy Scale: Good       Standing balance-Leahy Scale: Good                               Pertinent Vitals/Pain Pain Assessment Pain Assessment: No/denies pain    Home Living Family/patient expects to be discharged to:: Private residence Living Arrangements: Alone Available Help at Discharge: Available PRN/intermittently Type of Home: House Home Access: Level entry       Home Layout: Two level;One level Home Equipment: Agricultural consultant (2 wheels)      Prior Function Prior Level of Function : Independent/Modified Independent             Mobility Comments: prior to 7/29, independent, working as a Education officer, 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, family present for now (visiting out of state), obtained RW for PRN use  Extremity/Trunk Assessment   Upper Extremity Assessment Upper Extremity Assessment: Overall WFL for tasks assessed    Lower Extremity Assessment Lower Extremity Assessment: Overall WFL for tasks assessed       Communication        Cognition Arousal: Alert Behavior During Therapy: WFL for tasks assessed/performed   PT - Cognitive impairments: No apparent impairments                         Following commands: Intact       Cueing       General Comments      Exercises     Assessment/Plan    PT Assessment Patient does not need any further PT services  PT Problem List         PT Treatment  Interventions      PT Goals (Current goals can be found in the Care Plan section)       Frequency       Co-evaluation               AM-PAC PT 6 Clicks Mobility  Outcome Measure Help needed turning from your back to your side while in a flat bed without using bedrails?: None Help needed moving from lying on your back to sitting on the side of a flat bed without using bedrails?: None Help needed moving to and from a bed to a chair (including a wheelchair)?: None Help needed standing up from a chair using your arms (e.g., wheelchair or bedside chair)?: None Help needed to walk in hospital room?: None Help needed climbing 3-5 steps with a railing? : A Little 6 Click Score: 23    End of Session Equipment Utilized During Treatment: Gait belt Activity Tolerance: Patient tolerated treatment well Patient left: in chair;with call bell/phone within reach;with family/visitor present Nurse Communication: Mobility status PT Visit Diagnosis: Other abnormalities of gait and mobility (R26.89);Difficulty in walking, not elsewhere classified (R26.2);Muscle weakness (generalized) (M62.81)    Time: 8652-8593 PT Time Calculation (min) (ACUTE ONLY): 19 min   Charges:   PT Evaluation $PT Eval Low Complexity: 1 Low PT Treatments $Therapeutic Activity: 8-22 mins PT General Charges $$ ACUTE PT VISIT: 1 Visit       Doyal Shams PT, DPT 4:07 PM,02/20/24

## 2024-02-21 LAB — HEMOGLOBIN A1C
Hgb A1c MFr Bld: 5.4 % (ref 4.8–5.6)
Mean Plasma Glucose: 108 mg/dL

## 2024-02-23 ENCOUNTER — Other Ambulatory Visit (HOSPITAL_BASED_OUTPATIENT_CLINIC_OR_DEPARTMENT_OTHER): Payer: Self-pay

## 2024-02-23 ENCOUNTER — Telehealth: Payer: Self-pay | Admitting: Cardiology

## 2024-02-23 NOTE — Telephone Encounter (Signed)
 Patient called back to please disregard she will still with Dr. Darliss

## 2024-02-23 NOTE — Telephone Encounter (Signed)
 Ok with me

## 2024-02-23 NOTE — Telephone Encounter (Signed)
 Patient would like to do a provider switch from Dr. Budd to Dr Darron. Please advise.

## 2024-02-24 ENCOUNTER — Other Ambulatory Visit (HOSPITAL_BASED_OUTPATIENT_CLINIC_OR_DEPARTMENT_OTHER): Payer: Self-pay

## 2024-02-26 ENCOUNTER — Other Ambulatory Visit: Payer: Self-pay

## 2024-02-26 ENCOUNTER — Other Ambulatory Visit (HOSPITAL_BASED_OUTPATIENT_CLINIC_OR_DEPARTMENT_OTHER): Payer: Self-pay

## 2024-02-27 ENCOUNTER — Encounter: Payer: Self-pay | Admitting: Cardiology

## 2024-02-27 ENCOUNTER — Ambulatory Visit: Attending: Cardiology | Admitting: Cardiology

## 2024-02-27 VITALS — BP 135/79 | HR 104 | Ht 64.0 in | Wt 214.6 lb

## 2024-02-27 DIAGNOSIS — I2510 Atherosclerotic heart disease of native coronary artery without angina pectoris: Secondary | ICD-10-CM

## 2024-02-27 DIAGNOSIS — R55 Syncope and collapse: Secondary | ICD-10-CM | POA: Diagnosis not present

## 2024-02-27 NOTE — Patient Instructions (Signed)
 Medication Instructions:  Your physician recommends that you continue on your current medications as directed. Please refer to the Current Medication list given to you today.   *If you need a refill on your cardiac medications before your next appointment, please call your pharmacy*  Lab Work: No labs ordered today  If you have labs (blood work) drawn today and your tests are completely normal, you will receive your results only by: MyChart Message (if you have MyChart) OR A paper copy in the mail If you have any lab test that is abnormal or we need to change your treatment, we will call you to review the results.  Testing/Procedures: No test ordered today   Follow-Up: At [facility], you and your health needs are our priority.  As part of our continuing mission to provide you with exceptional heart care, our providers are all part of one team.  This team includes your primary Cardiologist (physician) and Advanced Practice Providers or APPs (Physician Assistants and Nurse Practitioners) who all work together to provide you with the care you need, when you need it.  Your next appointment:   3 month(s)  Provider:   You may see Dr. Darliss or one of the following Advanced Practice Providers on your designated Care Team:   Lonni Meager, NP Lesley Maffucci, PA-C Bernardino Bring, PA-C Cadence Dorothy, PA-C Tylene Lunch, NP Barnie Hila, NP    We recommend signing up for the patient portal called MyChart.  Sign up information is provided on this After Visit Summary.  MyChart is used to connect with patients for Virtual Visits (Telemedicine).  Patients are able to view lab/test results, encounter notes, upcoming appointments, etc.  Non-urgent messages can be sent to your provider as well.   To learn more about what you can do with MyChart, go to [email or web address].

## 2024-02-27 NOTE — Progress Notes (Signed)
 Cardiology Office Note:    Date:  02/27/2024   ID:  Debbie [name], DOB 1956-03-04, MRN [id number]  PCP:  Debbie Bette Hover, MD   [facility] Providers Cardiologist:  None     Referring MD: Debbie [name], *   Chief Complaint  Patient presents with   Follow-up    2 WEEK follow up pat has been doing well with no complaints of chest pain, chest pressure or SOB, medciation reviewed verbally with patient    History of Present Illness:    Debbie [name] is a 68 y.o. female with a hx of STEMI s/p PCI/DES x2 (LCx, OM1, PBA of OM2 on 7/25), hypertension, hyperlipidemia, PAD (s/p right femoral stent 2008, femoral bypass 2013) who presents to establish care due to recent MI.  Patient had a heart attack last week in [place].  Presented to [facility], in [place]  due to a cardiac arrest.  Apparently underwent CPR x 10 minutes before ROSC, requiring intubation.  Presented to [facility] in [place], cardiac cath revealed obstructive disease, 2 drug-eluting stents where placed to LCx, OM1.  Patient presented to establish care with myself about 10 days ago and had a seizure-like activity, urine incontinence prompting her to be transferred to the ED.  Echo 02/20/2024 showed EF 60 to 65%.  Cardiac monitor was placed, patient is currently wearing.  Evaluated by neurology, no antiepileptic medication was recommended given that all seizure-like episodes have been aborted in less than 24 hours.  She denies having any further episodes since.   Past Medical History:  Diagnosis Date   Genital warts    Hyperlipidemia    Hypertension    Myocardial infarction [facility])    Peripheral arterial disease (HCC)    Status post angioplasty with stent    Known femoral stent occlusion    Past Surgical History:  Procedure Laterality Date   ABDOMINAL AORTAGRAM N/A 08/09/2011   Procedure: ABDOMINAL EZELLA;  Surgeon: Carlin FORBES Haddock, MD;  Location: [facility] CATH LAB;  Service:  Cardiovascular;  Laterality: N/A;   ANGIOPLASTY / STENTING FEMORAL  2008   Right femoral artery stenting done in [place]    COLONOSCOPY WITH PROPOFOL  N/A 06/11/2021   Procedure: COLONOSCOPY WITH PROPOFOL ;  Surgeon: Therisa Bi, MD;  Location: [facility] ENDOSCOPY;  Service: Gastroenterology;  Laterality: N/A;  2ND ARRIVAL, PLEASE   FEMORAL BYPASS Right 08/2011   Dr. Sherre at [facility]    Current Medications: Current Meds  Medication Sig   aspirin  81 MG EC tablet Take 1 tablet by mouth daily.   docusate sodium  (COLACE) 100 MG capsule Take 100 mg by mouth 2 (two) times daily.   HYDROcodone -acetaminophen  (NORCO/VICODIN) 5-325 MG tablet Take 1 tablet by mouth every 6 (six) hours as needed.   hydrOXYzine  (ATARAX ) 25 MG tablet Take 25 mg by mouth 3 (three) times daily as needed (for anxiety).   metoprolol  succinate (TOPROL -XL) 25 MG 24 hr tablet Take 25 mg by mouth daily.   mupirocin ointment (BACTROBAN) 2 % Apply 1 Application topically 2 (two) times daily.   pantoprazole  (PROTONIX ) 40 MG tablet Take 40 mg by mouth daily before breakfast.   rosuvastatin  (CRESTOR ) 40 MG tablet Take 40 mg by mouth at bedtime.   ticagrelor  (BRILINTA ) 90 MG TABS tablet Take 90 mg by mouth 2 (two) times daily.   tirzepatide  (ZEPBOUND ) 15 MG/0.5ML Pen Inject 15 mg into the skin once a week.     Allergies:   Sulfa antibiotics   Social History   Socioeconomic History  Marital status: Single    Spouse name: Not on file   Number of children: Not on file   Years of education: Not on file   Highest education level: Not on file  Occupational History   Not on file  Tobacco Use   Smoking status: Never   Smokeless tobacco: Never  Vaping Use   Vaping status: Never Used  Substance and Sexual Activity   Alcohol use: No   Drug use: No   Sexual activity: Not Currently    Birth control/protection: Post-menopausal  Other Topics Concern   Not on file  Social History Narrative   Not on file   Social Drivers of Health    Financial Resource Strain: Low Risk  (06/02/2023)   Received from [facility]   Overall Financial Resource Strain (CARDIA)    Difficulty of Paying Living Expenses: Not hard at all  Food Insecurity: Food Insecurity Present (06/02/2023)   Received from [facility]   Hunger Vital Sign    Within the past 12 months, you worried that your food would run out before you got the money to buy more.: Never true    Within the past 12 months, the food you bought just didn't last and you didn't have money to get more.: Sometimes true  Transportation Needs: No Transportation Needs (06/02/2023)   Received from [facility] - Transportation    In the past 12 months, has lack of transportation kept you from medical appointments or from getting medications?: No    Lack of Transportation (Non-Medical): No  Physical Activity: Not on file  Stress: Not on file  Social Connections: Not on file     Family History: The patient's family history includes Breast cancer in her maternal aunt; Diabetes in her sister; Hypertension in her sister; Stroke in her maternal uncle; Uterine cancer in her mother.  ROS:   Please see the history of present illness.     All other systems reviewed and are negative.  EKGs/Labs/Other Studies Reviewed:    The following studies were reviewed today:       Recent Labs: 02/19/2024: B Natriuretic Peptide 64.1; Magnesium  1.9 02/20/2024: ALT 32; BUN 20; Creatinine, Ser 0.83; Hemoglobin 8.7; Platelets 343; Potassium 3.9; Sodium 141  Recent Lipid Panel    Component Value Date/Time   CHOL 208 (H) 11/28/2017 1656   TRIG 117 11/28/2017 1656   HDL 47 11/28/2017 1656   CHOLHDL 4.4 11/28/2017 1656   LDLCALC 138 (H) 11/28/2017 1656     Risk Assessment/Calculations:             Physical Exam:    VS:  BP 135/79 (BP Location: Left Arm, Patient Position: Sitting, Cuff Size: Normal)   Pulse (!) 104   Ht 5' 4 (1.626  m)   Wt 214 lb 9.6 oz (97.3 kg)   LMP 07/09/2007 (Approximate)   SpO2 96%   BMI 36.84 kg/m     Wt Readings from Last 3 Encounters:  02/27/24 214 lb 9.6 oz (97.3 kg)  02/19/24 218 lb 6.4 oz (99.1 kg)  02/18/24 220 lb (99.8 kg)     GEN:  Well nourished, soft-spoken HEENT: Normal NECK: No JVD; No carotid bruits CARDIAC: RRR, no murmurs, rubs, gallops RESPIRATORY:  Clear to auscultation without rales, wheezing or rhonchi  ABDOMEN: Soft, non-tender, distended MUSCULOSKELETAL:  No edema; No deformity  SKIN: Warm and dry NEUROLOGIC:  Alert and oriented x 3 PSYCHIATRIC:  Normal affect  ASSESSMENT:    1. Coronary artery disease involving native heart, unspecified vessel or lesion type, unspecified whether angina present   2. Syncope and collapse     PLAN:    In order of problems listed above:  Syncope, etiology unclear, suggest seizures, although arrhythmia also possible.  Echocardiogram showed normal systolic function EF 60 to 65%.  Currently wearing cardiac monitor.  Will evaluate cardiac monitor when results are available.  Okay for cardiac rehab if monitor is unrevealing.  Will refer patient to neurology for second opinion.  Patient appears hesitant regarding second opinion from neurology. CAD/STEMI s/p PCI/DES x2 (LCx, OM1, PBA of OM2 on 7/25).  Denies chest pain.  Continue aspirin , Brilinta , Crestor  40 mg daily, Toprol  XL 25 mg daily.  Echo 8/25 EF 60 to 65%.  Okay for cardiac rehab.  Cardiac monitor showed no significant arrhythmias. Hypertension, BP controlled.  Continue Toprol -XL 25 mg daily.  Follow-up in 3 months    Cardiac Rehabilitation Eligibility Assessment        Medication Adjustments/Labs and Tests Ordered: Current medicines are reviewed at length with the patient today.  Concerns regarding medicines are outlined above.  Orders Placed This Encounter  Procedures   Ambulatory referral to Neurology   No orders of the defined types were placed in this  encounter.   Patient Instructions  Medication Instructions:  Your physician recommends that you continue on your current medications as directed. Please refer to the Current Medication list given to you today.   *If you need a refill on your cardiac medications before your next appointment, please call your pharmacy*  Lab Work: No labs ordered today  If you have labs (blood work) drawn today and your tests are completely normal, you will receive your results only by: MyChart Message (if you have MyChart) OR A paper copy in the mail If you have any lab test that is abnormal or we need to change your treatment, we will call you to review the results.  Testing/Procedures: No test ordered today   Follow-Up: At [facility], you and your health needs are our priority.  As part of our continuing mission to provide you with exceptional heart care, our providers are all part of one team.  This team includes your primary Cardiologist (physician) and Advanced Practice Providers or APPs (Physician Assistants and Nurse Practitioners) who all work together to provide you with the care you need, when you need it.  Your next appointment:   3 month(s)  Provider:   You may see Dr. Darliss or one of the following Advanced Practice Providers on your designated Care Team:   Lonni Meager, NP Lesley Maffucci, PA-C Bernardino Bring, PA-C Cadence [place], PA-C Tylene Lunch, NP Barnie Hila, NP    We recommend signing up for the patient portal called MyChart.  Sign up information is provided on this After Visit Summary.  MyChart is used to connect with patients for Virtual Visits (Telemedicine).  Patients are able to view lab/test results, encounter notes, upcoming appointments, etc.  Non-urgent messages can be sent to your provider as well.   To learn more about what you can do with MyChart, go to [email or web address].          Signed, Redell Darliss, MD  02/27/2024 11:37 AM     [facility]

## 2024-03-01 ENCOUNTER — Encounter: Payer: Self-pay | Admitting: Neurology

## 2024-03-02 ENCOUNTER — Other Ambulatory Visit: Payer: Self-pay | Admitting: Emergency Medicine

## 2024-03-02 DIAGNOSIS — D649 Anemia, unspecified: Secondary | ICD-10-CM

## 2024-03-03 ENCOUNTER — Encounter: Payer: Self-pay | Admitting: Neurology

## 2024-03-05 ENCOUNTER — Encounter: Payer: Self-pay | Admitting: Cardiology

## 2024-03-05 ENCOUNTER — Other Ambulatory Visit: Payer: Self-pay | Admitting: Cardiology

## 2024-03-05 NOTE — Telephone Encounter (Signed)
 Pt is calling requesting a refill on non cardiac medication hydroxyzine . Would Dr. Darliss like to refill this non cardiac medication? Please address

## 2024-03-05 NOTE — Telephone Encounter (Signed)
*  STAT* If patient is at the pharmacy, call can be transferred to refill team.   1. Which medications need to be refilled? (please list name of each medication and dose if known) hydrOXYzine  (ATARAX ) 25 MG tablet    2. Would you like to learn more about the convenience, safety, & potential cost savings by using the [facility]?    3. Are you open to using the [facility] (Type [facility]. ).   4. Which pharmacy/location (including street and city if local pharmacy) is medication to be sent to? [id number] - [place], [place] - [address]    5. Do they need a 30 day or 90 day supply? 90 day

## 2024-03-10 ENCOUNTER — Encounter: Payer: Self-pay | Admitting: Neurology

## 2024-03-10 ENCOUNTER — Ambulatory Visit: Admitting: Neurology

## 2024-03-10 VITALS — Ht 65.5 in | Wt 212.0 lb

## 2024-03-10 DIAGNOSIS — R55 Syncope and collapse: Secondary | ICD-10-CM | POA: Diagnosis not present

## 2024-03-10 NOTE — Progress Notes (Signed)
 NEUROLOGY CONSULTATION NOTE  Debbie [name] MRN: [id number] DOB: October 26, 1955  Referring provider: Dr. Redell Cave Primary care provider: Dr. Bette Catalina Hough  Reason for consult:  syncope  Dear Dr Cave:  Thank you for your kind referral of Debbie [name] for consultation of the above symptoms. Although her history is well known to you, please allow me to reiterate it for the purpose of our medical record. The patient was accompanied to the clinic by her sister Debbie [name] who also provides collateral information. Records and images were personally reviewed where available.   HISTORY OF PRESENT ILLNESS: This is a 68 year old woman with a history of hypertension, hyperlipidemia, PAD, STEMI s/p PCI on 02/03/24 complicated by cardiac arrest, presenting for evaluation of syncope. She was discharged home on 8/10 then on PCP follow-up on 8/13, while her daughter was speaking, they noticed patient was not responding. She said she was not feeling well and reported chest pain and felt lightheaded. She asked for water then woke up slid down on the chair. She was reported to have started to seize and suffer urinary incontinence. She was brought to the ER where she was reported to have loss of consciousness with shaking and bladder incontinence. She continued to report chest pain in the ER. Bloodwork, EKG (sinus bradycardia), head CT unremarkable. The next day she was at the Cardiology appointment when she started reporting feeling dizzy and faint. Things looked fuzzy, she did not feel good then woke up still sitting on the chair. She was witnessed to have a blank stare while sitting on the exam table, arms seemed tense, she again had urinary incontinence. She had loss of consciousness for 2-3 minutes SBD was 112, HR 87. EKG was NSR with PACs. No clear post-event confusion. She was admitted overnight and had a normal brain MRI without contrast. Echocardiogram showed EF of 60-65%. She had a Zio patch,  results unavailable for review.   She reports a syncopal episode in 06/2022 when she had Covid and fell, hitting her head. She denies any other staring/unresponsive episodes or gaps in time. No olfactory/gustatory hallucinations, deja vu, rising epigastric sensation, focal numbness/tingling/weakness, myoclonic jerks. No headaches, diplopia, dysarthria/dysphagia, neck/back pain, bowel dysfunction. She does not recall the events around her hear attack, but since then states I remember everything, she denies missing medications or bill payments. She had left hip pain before her STEMI, now her left frontal thigh hurts. She gets 6-7 hours of interrupted sleep, waking up to urinate. No alcohol. She lives alone. 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  Her niece had a cerebral aneurysm and seizure. She had a normal birth and early development.  There is no history of febrile convulsions, CNS infections such as meningitis/encephalitis, significant traumatic brain injury, neurosurgical procedures.   PAST MEDICAL HISTORY: Past Medical History:  Diagnosis Date   Genital warts    Hyperlipidemia    Hypertension    Myocardial infarction (HCC)    Peripheral arterial disease (HCC)    Status post angioplasty with stent    Known femoral stent occlusion    PAST SURGICAL HISTORY: Past Surgical History:  Procedure Laterality Date   ABDOMINAL AORTAGRAM N/A 08/09/2011   Procedure: ABDOMINAL EZELLA;  Surgeon: Carlin FORBES Haddock, MD;  Location[facility] C[facility] LAB;  Service: Cardiovascular;  Laterality: N/A;   ANGIOPLASTY / STENTING FEMORAL  2008   Right femoral artery stenting done in [place]    COLONOSCOPY WITH PROPOFOL  N/A 06/11/2021   Procedure:  COLONOSCOPY WITH PROPOFOL ;  Surgeon: Therisa Bi, MD;  Location: [facility] ENDOSCOPY;  Service: Gastroenterology;  Laterality: N/A;  2ND ARRIVAL, PLEASE   FEMORAL BYPASS Right 08/2011   Dr[facility] Sherre at [facility]    MEDICATIONS: Current Outpatient  Medications on File Prior to Visit  Medication Sig Dispense Refill   aspirin  81 MG EC tablet Take 1 tablet by mouth daily.     docusate sodium  (COLACE) 100 MG capsule Take 100 mg by mouth 2 (two) times daily.     metoprolol  succinate (TOPROL -XL) 25 MG 24 hr tablet Take 25 mg by mouth daily.     mupirocin ointment (BACTROBAN) 2 % Apply 1 Application topically 2 (two) times daily.     pantoprazole  (PROTONIX ) 40 MG tablet Take 40 mg by mouth daily before breakfast.     rosuvastatin  (CRESTOR ) 40 MG tablet Take 40 mg by mouth at bedtime.     ticagrelor  (BRILINTA ) 90 MG TABS tablet Take 90 mg by mouth 2 (two) times daily.     tirzepatide  (ZEPBOUND ) 15 MG/0.5ML Pen Inject 15 mg into the skin once a week. 2 mL 2   No current facility-administered medications on file prior to visit.    ALLERGIES: Allergies  Allergen Reactions   Sulfa Antibiotics     FAMILY HISTORY: Family History  Problem Relation Age of Onset   Uterine cancer Mother    Diabetes Sister    Hypertension Sister    Breast cancer Maternal Aunt    Stroke Maternal Uncle     SOCIAL HISTORY: Social History   Socioeconomic History   Marital status: Single    Spouse name: Not on file   Number of children: Not on file   Years of education: Not on file   Highest education level: Not on file  Occupational History   Not on file  Tobacco Use   Smoking status: Never   Smokeless tobacco: Never  Vaping Use   Vaping status: Never Used  Substance and Sexual Activity   Alcohol use: No   Drug use: No   Sexual activity: Not Currently    Birth control/protection: Post-menopausal  Other Topics Concern   Not on file  Social History Narrative   Living w/ alone, 1 story home 3 steps   Coffee 1 cup today   Social Drivers of Corporate investment banker Strain: Low Risk  (06/02/2023)   Received from [facility]   Overall Financial Resource Strain (CARDIA)    Difficulty of Paying Living Expenses: Not hard at all   Food Insecurity: Food Insecurity Present (06/02/2023)   [facility]   Hunger Vital Sign    Within the past 12 months, you worried that your food would run out before you got the money to buy more.: Never true    Within the past 12 months, the food you bought just didn't last and you didn't have money to get more.: Sometimes true  Transportation Needs: No Transportation Needs (06/02/2023)   Received from [facility] - Transportation    In the past 12 months, has lack of transportation kept you from medical appointments or from getting medications?: No    Lack of Transportation (Non-Medical): No  Physical Activity: Not on file  Stress: Not on file  Social Connections: Not on file  Intimate Partner Violence: Not on file     PHYSICAL EXAM: Vitals:   03/10/24 1046  SpO2: 97%   Orthostatic Vital signs: Supine  BP 128/82 HR 74 Standing (1 minute) BP 136/84 HR 109 Standing (5 minutes) BP 126/88 HR 111 General: No acute distress Head:  Normocephalic/atraumatic Skin/Extremities: No rash, no edema Neurological Exam: Mental status: alert and oriented to person, place, and time, no dysarthria or aphasia, Fund of knowledge is appropriate.  Recent and remote memory are intact, 3/3 delayed recall.  Attention and concentration are normal, 5/5 WORLD backwards Cranial nerves: CN I: not tested CN II: pupils equal, round, visual fields intact CN III, IV, VI:  full range of motion, no nystagmus, no ptosis CN V: facial sensation intact CN VII: upper and lower face symmetric CN VIII: hearing intact to conversation Bulk & Tone: normal, no fasciculations. Motor: 5/5 throughout with no pronator drift. Sensation: intact to light touch, cold, pin, vibration sense.  No extinction to double simultaneous stimulation.  Romberg test negative Deep Tendon Reflexes: +1 both UE, left LE, +2 right LE Cerebellar: no incoordination on finger to nose  testing Gait: narrow-based and steady, no ataxia Tremor: none   IMPRESSION: This is a 68 year old woman with a history of hypertension, hyperlipidemia, PAD, STEMI s/p PCI on 02/03/24 complicated by cardiac arrest, presenting for evaluation of syncope that occurred twice a few days after hospital discharge. None since 02/19/24. Her neurological exam is normal. MRI brain normal. Symptoms concerning for cardiac or vasovagal etiology, less likely seizure. She is noted to have orthostatic tachycardia (supine 74 --> standing 111). Zio monitor results pending. We will do a 1-hour EEG. [place] driving laws were discussed with the patient, and she knows to stop driving after an episode of loss of consciousness until 6 months event-free. Our office will call with EEG results, if normal, follow-up as needed.    Thank you for allowing me to participate in the care of this patient. Please do not hesitate to call for any questions or concerns.   Darice Shivers, M.D.  CC: Dr. Darliss, Dr. Eliverto

## 2024-03-10 NOTE — Patient Instructions (Signed)
 Good to meet you.  Schedule 1-hour EEG. Our office will call with results, if normal, follow-up as needed  2. It is prudent to recommend that all persons should be free of syncopal (passing out) episodes for at least six months to be granted the driving privilege. (THE [employer]  PHYSICIAN'S GUIDE TO DRIVER MEDICAL EVALUATION, Second Edition, Medical Review Branch, Associate Professor, Division of [employer],   Department of Transportation, July 2004)

## 2024-03-11 ENCOUNTER — Encounter: Payer: Self-pay | Admitting: Cardiology

## 2024-03-11 ENCOUNTER — Ambulatory Visit: Admitting: Neurology

## 2024-03-11 ENCOUNTER — Encounter: Payer: Self-pay | Admitting: Neurology

## 2024-03-11 DIAGNOSIS — R55 Syncope and collapse: Secondary | ICD-10-CM

## 2024-03-11 NOTE — Progress Notes (Signed)
 EEG complete and ready for review.

## 2024-03-11 NOTE — Procedures (Signed)
 ELECTROENCEPHALOGRAM REPORT  Date of Study: 03/11/2024  Patient's Name: Debbie [name] MRN: [id number] Date of Birth: 03-28-1956  Referring Provider: Dr. Darice Shivers  Clinical History: This is a 68 year old woman with 2 episodes of feeling unwell, blank stare, with urinary incontinence. EEG for classification.  Medications: Aspirin , Brilinta , Toprol , Crestor , Zepbound   Technical Summary: A multichannel digital 1-hour EEG recording measured by the international 10-20 system with electrodes applied with paste and impedances below 5000 ohms performed in our laboratory with EKG monitoring in an awake and asleep patient.  Hyperventilation was not performed. Photic stimulation was performed.  The digital EEG was referentially recorded, reformatted, and digitally filtered in a variety of bipolar and referential montages for optimal display.    Description: The patient is awake and asleep during the recording.  During maximal wakefulness, there is a symmetric, medium voltage 9.5 Hz posterior dominant rhythm that attenuates with eye opening.  The record is symmetric.  During drowsiness and sleep, there is an increase in theta slowing of the background.  Vertex waves and symmetric sleep spindles were seen. Photic stimulation did not elicit any abnormalities.  There were no epileptiform discharges or electrographic seizures seen.    EKG lead was unremarkable.  Impression: This 1-hour awake and asleep EEG is normal.    Clinical Correlation: A normal EEG does not exclude a clinical diagnosis of epilepsy.  If further clinical questions remain, prolonged EEG may be helpful.  Clinical correlation is advised.   Darice Shivers, M.D.

## 2024-03-12 ENCOUNTER — Encounter: Payer: Self-pay | Admitting: Neurology

## 2024-03-12 ENCOUNTER — Telehealth: Payer: Self-pay | Admitting: Neurology

## 2024-03-12 NOTE — Telephone Encounter (Signed)
 Notified EEG results and agreed to follow-up w/ cardiology.

## 2024-03-12 NOTE — Telephone Encounter (Signed)
 Pt said she got her EEG results- it states its normal. She wants a call back. She also has sent two mychart messages as well and has not heard from anyone

## 2024-03-12 NOTE — Telephone Encounter (Signed)
 Pls let her know that MyChart messages are answered as fast as we can but they are not instant messengers. Please let her know the EEG is normal. Thanks

## 2024-03-12 NOTE — Telephone Encounter (Signed)
 Pt been notified w/ EEG result and [facility] step is to f/u w/ cardiology.

## 2024-03-14 ENCOUNTER — Encounter: Payer: Self-pay | Admitting: Cardiology

## 2024-03-16 ENCOUNTER — Encounter: Payer: Self-pay | Admitting: Cardiology

## 2024-03-16 MED ORDER — TICAGRELOR 90 MG PO TABS: 90.0000 mg | ORAL_TABLET | Freq: Two times a day (BID) | ORAL | 3 refills | Status: AC

## 2024-03-16 MED ORDER — ROSUVASTATIN CALCIUM 40 MG PO TABS: 40.0000 mg | ORAL_TABLET | Freq: Every day | ORAL | 3 refills | Status: AC

## 2024-03-16 MED ORDER — METOPROLOL SUCCINATE ER 25 MG PO TB24: 25.0000 mg | ORAL_TABLET | Freq: Every day | ORAL | 3 refills | Status: DC

## 2024-03-16 MED ORDER — ASPIRIN 81 MG PO TBEC: 81.0000 mg | DELAYED_RELEASE_TABLET | Freq: Every day | ORAL | 3 refills | Status: DC

## 2024-03-19 ENCOUNTER — Telehealth: Payer: Self-pay | Admitting: Physician Assistant

## 2024-03-19 NOTE — Telephone Encounter (Signed)
Pt is calling to get monitor results.

## 2024-03-19 NOTE — Telephone Encounter (Signed)
Pt updated via mychart

## 2024-03-22 ENCOUNTER — Ambulatory Visit: Payer: PRIVATE HEALTH INSURANCE | Admitting: Clinical

## 2024-03-22 ENCOUNTER — Other Ambulatory Visit (HOSPITAL_BASED_OUTPATIENT_CLINIC_OR_DEPARTMENT_OTHER): Payer: Self-pay

## 2024-03-22 DIAGNOSIS — F4320 Adjustment disorder, unspecified: Secondary | ICD-10-CM

## 2024-03-22 MED ORDER — ZEPBOUND 15 MG/0.5ML ~~LOC~~ SOAJ
15.0000 mg | SUBCUTANEOUS | 2 refills | Status: DC
  Filled 2024-03-22: qty 2, 28d supply, fill #0
  Filled 2024-04-08 – 2024-04-16 (×2): qty 2, 28d supply, fill #1
  Filled 2024-05-15 – 2024-05-17 (×2): qty 2, 28d supply, fill #2

## 2024-03-22 NOTE — Progress Notes (Unsigned)
 [facility] Counselor Initial Adult Exam  Name: Debbie [name] Date: 03/22/2024 MRN: [id number] DOB: 1956-07-08 PCP: Eliverto Bette Hover, MD  Time spent: 9:36am - 10:14am   Guardian/Payee:  NA    Paperwork requested: NA  Reason for Visit /Presenting Problem: Patient stated, I pretty much recently, July 20th, I suffered a cardiac arrest, I was hospitalized, I was on a ventilator. Patient stated, I recently started physical therapy, I have bursitis in my left hip. Patient stated, I chose to reach out to my job because we have an EAP because I chose to focus on my stress.   Mental Status Exam: Appearance:   Neat and Well Groomed     Behavior:  Appropriate  Motor:  Normal  Speech/Language:   Clear and Coherent and Normal Rate  Affect:  Appropriate  Mood:  normal  Thought process:  normal  Thought content:    WNL  Sensory/Perceptual disturbances:    WNL  Orientation:  oriented to person, place, time/date, and situation  Attention:  Good  Concentration:  Good  Memory:  WNL  Fund of knowledge:   Good  Insight:    Good  Judgment:   Good  Impulse Control:  Good    Reported Symptoms:  Patient reported since patient's return home on August 10th, patient has been focusing on patient's health. Patient reported patient was hospitalized July 20th through August 10th. Patient reported pain in left hip and reported experiencing pain down patient's left leg. Patient reported waking up at 4 am/5 am. Patient reported decreased appetite after returning home August 10th.   Risk Assessment: Danger to Self:  No Patient denied current and past suicidal ideation, homicidal ideation, symptoms of psychosis Self-injurious Behavior: No Danger to Others: No Duty to Warn:no Physical Aggression / Violence:No  Access to Firearms a concern: No  Gang Involvement:No  Patient / guardian was educated about steps to take if suicide or homicide risk level increases between visits:  yes While future psychiatric events cannot be accurately predicted, the patient does not currently require acute inpatient psychiatric care and does not currently meet [facility]  involuntary commitment criteria.  Substance Abuse History: Current substance abuse: No   Patient reported no current or past substance use  Past Psychiatric History:   Previous psychological history is significant for work related stressors Outpatient Providers: has used EAP in the past for counseling related to patient's work History of Psych Hospitalization: No  Psychological Testing: myers briggs   Abuse History:  Victim of: No., none   Report needed: No. Victim of Neglect:No. Perpetrator of none  Witness / Exposure to Domestic Violence: No   Protective Services Involvement: No  Witness to [employer] Violence:  No   Family History:  Family History  Problem Relation Age of Onset   Uterine cancer Mother    Diabetes Sister    Hypertension Sister    Breast cancer Maternal Aunt    Stroke Maternal Uncle     Living situation: the patient lives alone  Sexual Orientation: Straight  Relationship Status: single  Name of spouse / other: NA If a parent, number of children / ages: 1 child age 38  Support Systems: siblings, professional supports, Multimedia programmer, coaching group, colleagues, friends  Surveyor, quantity Stress:  No   Income/Employment/Disability: Games developer: No   Educational History: Education: post Engineer, maintenance (IT) work or degree  Religion/Sprituality/World View: Armed forces training and education officer, Nature conservation officer priest, from [place]  Any cultural differences that may affect / interfere with treatment:  not  applicable   Recreation/Hobbies: friends, eating out, watching television, watching old movies/westerns, go to the park, riding around sight seeing, going to the gym, spa days/retreats, shopping, gospel music  Stressors: Health problems  - recent cardiac event and  bursitis   Strengths: mediation, prayer, watches TV, reads, sits in quiet spaces/back porch, exercise, walks, talking to supports, music, going to the spa/retreats  Barriers:  none reported   Legal History: Pending legal issue / charges: The patient has no significant history of legal issues. History of legal issue / charges: none  Medical History/Surgical History: reviewed Past Medical History:  Diagnosis Date   Genital warts    Hyperlipidemia    Hypertension    Myocardial infarction (HCC)    Peripheral arterial disease (HCC)    Status post angioplasty with stent    Known femoral stent occlusion    Past Surgical History:  Procedure Laterality Date   ABDOMINAL AORTAGRAM N/A 08/09/2011   Procedure: ABDOMINAL EZELLA;  Surgeon: Carlin FORBES Haddock, MD;  Location: [facility] CATH LAB;  Service: Cardiovascular;  Laterality: N/A;   ANGIOPLASTY / STENTING FEMORAL  2008   Right femoral artery stenting done in [place]    COLONOSCOPY WITH PROPOFOL  N/A 06/11/2021   Procedure: COLONOSCOPY WITH PROPOFOL ;  Surgeon: Therisa Bi, MD;  Location: [facility] ENDOSCOPY;  Service: Gastroenterology;  Laterality: N/A;  2ND ARRIVAL, PLEASE   FEMORAL BYPASS Right 08/2011   Dr. Sherre at [facility]    Medications: Current Outpatient Medications  Medication Sig Dispense Refill   aspirin  EC 81 MG tablet Take 1 tablet (81 mg total) by mouth daily. 30 tablet 3   docusate sodium  (COLACE) 100 MG capsule Take 100 mg by mouth 2 (two) times daily.     metoprolol  succinate (TOPROL -XL) 25 MG 24 hr tablet Take 1 tablet (25 mg total) by mouth daily. 30 tablet 3   mupirocin ointment (BACTROBAN) 2 % Apply 1 Application topically 2 (two) times daily.     pantoprazole  (PROTONIX ) 40 MG tablet Take 40 mg by mouth daily before breakfast.     rosuvastatin  (CRESTOR ) 40 MG tablet Take 1 tablet (40 mg total) by mouth at bedtime. 30 tablet 3   ticagrelor  (BRILINTA ) 90 MG TABS tablet Take 1 tablet (90 mg total) by mouth 2 (two) times daily. 60 tablet  3   tirzepatide  (ZEPBOUND ) 15 MG/0.5ML Pen Inject 15 mg into the skin once a week. 2 mL 2   No current facility-administered medications for this visit.  Patient reported no longer taking Protonix , Bactroban  Allergies  Allergen Reactions   Sulfa Antibiotics     Diagnoses:  Adjustment disorder, unspecified type  Plan of Care: Patient is a 68 year old female who presented for an initial assessment. Clinician conducted initial assessment via caregility video initially and then via telephone from clinician's home office due to loss of connection through caregility. Patient provided verbal consent to proceed with telehealth session and is aware of limitations of telephone or video visits. Patient participated in session from patient's vehicle at the gym. Patient reported waking up at 4 am/5 am. Patient reported decreased appetite after returning home August 10th. Patient denied current and past suicidal ideation, homicidal ideation, symptoms of psychosis. Patient reported no current or past substance use. Patient reported a history of individual therapy. Patient reported no history of psychiatric hospitalizations. Patient reported patient's health is a current stressor. Patient reported patient's siblings, professional supports, Multimedia programmer, coaching group, colleagues, friends are current supports. It recommended patient participate in individual therapy biweekly.  Clinician will review recommendations and treatment plan with patient during follow up appointment. Treatment plan will be developed during follow up appointment.   Collaboration of Care: Other Patient declined consents at this time  Patient/Guardian was advised Release of Information must be obtained prior to any record release in order to collaborate their care with an outside provider. Patient/Guardian was advised if they have not already done so to contact [facility] to sign all necessary forms in order for us   to release information regarding their care.   Consent: Patient/Guardian gives verbal consent for treatment and assignment of benefits for services provided during this visit. Patient/Guardian expressed understanding and agreed to proceed.   Darice Seats, LCSW

## 2024-03-22 NOTE — Progress Notes (Unsigned)
   Darice Seats, LCSW

## 2024-03-23 ENCOUNTER — Telehealth: Payer: Self-pay | Admitting: Cardiology

## 2024-03-23 ENCOUNTER — Other Ambulatory Visit (HOSPITAL_COMMUNITY): Payer: Self-pay

## 2024-03-23 ENCOUNTER — Encounter: Payer: Self-pay | Admitting: Cardiology

## 2024-03-23 ENCOUNTER — Other Ambulatory Visit (HOSPITAL_BASED_OUTPATIENT_CLINIC_OR_DEPARTMENT_OTHER): Payer: Self-pay

## 2024-03-23 ENCOUNTER — Ambulatory Visit: Payer: Self-pay | Admitting: Physician Assistant

## 2024-03-23 DIAGNOSIS — R55 Syncope and collapse: Secondary | ICD-10-CM | POA: Diagnosis not present

## 2024-03-23 DIAGNOSIS — Z0279 Encounter for issue of other medical certificate: Secondary | ICD-10-CM

## 2024-03-23 MED ORDER — METOPROLOL SUCCINATE ER 50 MG PO TB24: 50.0000 mg | ORAL_TABLET | Freq: Every day | ORAL | 3 refills | Status: AC

## 2024-03-23 NOTE — Telephone Encounter (Signed)
 Patient came by office Dropped off [employer] forms to be completed Patient filled out [employer] forms and paid $29 cash fee *mail receipt* Placed in nurse box

## 2024-03-23 NOTE — Telephone Encounter (Signed)
 Pt is requesting a callback regarding short term disability form. Please advise

## 2024-03-23 NOTE — Telephone Encounter (Signed)
 Prelim results sent to pt MyChart att

## 2024-03-23 NOTE — Telephone Encounter (Signed)
 Preliminary report showed a predominant rhythm of sinus (normal) with an average rate of 87 bpm (normal) with a range of 56 to 194 bpm.  4 episodes of fast heartbeats from the bottom portion of the heart occurred lasting just 6 beats, 142 episodes of fast heartbeats from the top portion of the heart occurred lasting up to 35.2 seconds, frequent extra beats from the top portion of the heart, and rare extra beats from the bottom portion of the heart.  These are findings that are frequently found on cardiac monitors.  No evidence of findings that would have caused syncope.  Await MD overread.  Follow-up as scheduled with primary cardiologist to determine if medications need adjustment.

## 2024-03-23 NOTE — Telephone Encounter (Signed)
 Called patient and informed that [employer] may need to fax record request again Clarified with patient that she would not need to pay $29 fee unless they are needing a provider signature No further questions at this time

## 2024-03-23 NOTE — Telephone Encounter (Signed)
 Pt calling back stating that she does not see the results on MyChart. She would either like a call back regarding these results or to have them resent on MyChart. Please advise.

## 2024-03-23 NOTE — Telephone Encounter (Signed)
 Patient called requesting heart monitor results. Nurse informed the patient that we have not yet received the preliminary report. However, the nurse was able to download the report from the [employer] website. The patient was informed that the report would be provided to the ordering PA for review and would receive a call back with recommendations.

## 2024-03-23 NOTE — Addendum Note (Signed)
 Encounter addended by: Cristopher Olivia PARAS, RN on: 03/23/2024 11:03 AM  Actions taken: Imaging Exam ended

## 2024-03-24 NOTE — Telephone Encounter (Signed)
 PATIENT REQUESTED WE MAIL THE FORMS TO HER HOME [employer]DR[employer]. FORMS MAILED.

## 2024-03-24 NOTE — Telephone Encounter (Signed)
 RECEIVED COMPLETED FORMS FROM DR AGBOR-ETANG. FAXED TO [employer] [phone] , AND SCANNED COMPLETED FORMS INTO CHART. ALSO, CALLED PATIENT AND INFORMED COMPLETED FORMS WERE FAXED TO [employer].

## 2024-03-26 ENCOUNTER — Ambulatory Visit: Admitting: Family Medicine

## 2024-03-29 ENCOUNTER — Other Ambulatory Visit: Payer: Self-pay

## 2024-03-29 ENCOUNTER — Encounter: Attending: Cardiology

## 2024-03-29 DIAGNOSIS — Z955 Presence of coronary angioplasty implant and graft: Secondary | ICD-10-CM | POA: Insufficient documentation

## 2024-03-29 DIAGNOSIS — I252 Old myocardial infarction: Secondary | ICD-10-CM | POA: Insufficient documentation

## 2024-03-29 DIAGNOSIS — I10 Essential (primary) hypertension: Secondary | ICD-10-CM | POA: Insufficient documentation

## 2024-03-29 DIAGNOSIS — I213 ST elevation (STEMI) myocardial infarction of unspecified site: Secondary | ICD-10-CM

## 2024-03-29 DIAGNOSIS — Z48812 Encounter for surgical aftercare following surgery on the circulatory system: Secondary | ICD-10-CM | POA: Insufficient documentation

## 2024-03-29 NOTE — Progress Notes (Signed)
 Initial phone call completed. Diagnosis can be found in [facility] 02/19/2024. EP Orientation scheduled for Monday, April 05, 2024 @ 0900.

## 2024-04-02 ENCOUNTER — Encounter: Payer: Self-pay | Admitting: Cardiology

## 2024-04-02 ENCOUNTER — Telehealth: Payer: Self-pay | Admitting: Emergency Medicine

## 2024-04-02 ENCOUNTER — Ambulatory Visit: Admitting: Neurology

## 2024-04-02 NOTE — Telephone Encounter (Signed)
 Our office documentation indicates the patient was notified on 02/20/2024 via MyChart at 5:53 PM to have a follow-up CBC in 1 week.

## 2024-04-02 NOTE — Telephone Encounter (Signed)
 Upon chart biopsy, it looks like the patient was notified via MyChart to have a follow-up CBC by clinical staff on 02/20/2024 at 5:53 PM.

## 2024-04-02 NOTE — Telephone Encounter (Signed)
 Pt called to remind of the the CBC ordered by Bernardino on 8/26 by me, pt declined lab draw and cited that at the last office visit Dr Budd didn't mention this lab draw, she stated she is not anemic and her diet includes lots of iron  -so it can't be low, and that there is no reason for the lab work, and she doesn't know [facility]  This RN explained that [employer] ordered the CBC AFTER her last office visit, the CBC was ordered to trend descending H and H levels, 8/15 lab work showed iron levels were within normal range, the clinical definition of anemia explained, and she has received results from Isabel (via me) in the past  Pt expressed disappointment that she is getting the notification one month after the order was placed; this RN apologized and couldn't find any evidence of my notifying pt prior to this call, pt reiterated she will not be getting her blood drawn with hearing from her nurse and doctor Summer and Dr Budd  Order canceled

## 2024-04-02 NOTE — Telephone Encounter (Signed)
 Patient is anemic with a down trending hemoglobin of 8.7 leading to the recommendation for follow-up CBC at time of discharge from the hospital.  Patient is on DAPT with recent STEMI in July.  I have reviewed with phone note that patient states she declines a CBC.  I do recommend patient follow-up with CBC.  Please forward to provider that saw patient in hospital follow-up to have them weigh in.

## 2024-04-05 ENCOUNTER — Ambulatory Visit (INDEPENDENT_AMBULATORY_CARE_PROVIDER_SITE_OTHER): Payer: PRIVATE HEALTH INSURANCE | Admitting: Clinical

## 2024-04-05 ENCOUNTER — Encounter: Payer: Self-pay | Admitting: Clinical

## 2024-04-05 ENCOUNTER — Encounter

## 2024-04-05 VITALS — Ht 64.5 in | Wt 203.6 lb

## 2024-04-05 DIAGNOSIS — I213 ST elevation (STEMI) myocardial infarction of unspecified site: Secondary | ICD-10-CM

## 2024-04-05 DIAGNOSIS — F4320 Adjustment disorder, unspecified: Secondary | ICD-10-CM

## 2024-04-05 DIAGNOSIS — Z955 Presence of coronary angioplasty implant and graft: Secondary | ICD-10-CM

## 2024-04-05 DIAGNOSIS — I252 Old myocardial infarction: Secondary | ICD-10-CM | POA: Diagnosis not present

## 2024-04-05 DIAGNOSIS — Z48812 Encounter for surgical aftercare following surgery on the circulatory system: Secondary | ICD-10-CM | POA: Diagnosis present

## 2024-04-05 DIAGNOSIS — I10 Essential (primary) hypertension: Secondary | ICD-10-CM | POA: Diagnosis not present

## 2024-04-05 NOTE — Progress Notes (Signed)
 Cardiac Individual Treatment Plan  Patient Details  Name: Debbie [name] MRN: [id number] Date of Birth: 08/12/1955 Referring Provider:   Flowsheet Row Cardiac Rehab from 04/05/2024 in [facility] Cardiac and Pulmonary Rehab  Referring Provider Dr. Redell Cave, MD    Initial Encounter Date:  Flowsheet Row Cardiac Rehab from 04/05/2024 in [facility] Cardiac and Pulmonary Rehab  Date 04/05/24    Visit Diagnosis: ST elevation myocardial infarction (STEMI), unspecified artery [facility])  Status post coronary artery stent placement  Patient's Home Medications on Admission:  Current Outpatient Medications:    aspirin  EC 81 MG tablet, Take 1 tablet (81 mg total) by mouth daily., Disp: 30 tablet, Rfl: 3   docusate sodium  (COLACE) 100 MG capsule, Take 100 mg by mouth 2 (two) times daily., Disp: , Rfl:    metoprolol  succinate (TOPROL -XL) 50 MG 24 hr tablet, Take 1 tablet (50 mg total) by mouth daily., Disp: 30 tablet, Rfl: 3   mupirocin ointment (BACTROBAN) 2 %, Apply 1 Application topically 2 (two) times daily., Disp: , Rfl:    pantoprazole  (PROTONIX ) 40 MG tablet, Take 40 mg by mouth daily before breakfast. (Patient not taking: Reported on 03/29/2024), Disp: , Rfl:    rosuvastatin  (CRESTOR ) 40 MG tablet, Take 1 tablet (40 mg total) by mouth at bedtime., Disp: 30 tablet, Rfl: 3   ticagrelor  (BRILINTA ) 90 MG TABS tablet, Take 1 tablet (90 mg total) by mouth 2 (two) times daily., Disp: 60 tablet, Rfl: 3   tirzepatide  (ZEPBOUND ) 15 MG/0.5ML Pen, Inject 15 mg into the skin once a week., Disp: 2 mL, Rfl: 2  Past Medical History: Past Medical History:  Diagnosis Date   Genital warts    Hyperlipidemia    Hypertension    Myocardial infarction [facility])    Peripheral arterial disease    Status post angioplasty with stent    Known femoral stent occlusion    Tobacco Use: Social History   Tobacco Use  Smoking Status Never  Smokeless Tobacco Never    Labs: Review Flowsheet       Latest Ref Rng & Units  08/09/2011 11/28/2017 02/20/2024  Labs for ITP Cardiac and Pulmonary Rehab  Cholestrol 100 - 199 mg/dL - 791  -  LDL (calc) 0 - 99 mg/dL - 861  -  HDL-C >60 mg/dL - 47  -  Trlycerides 0 - 149 mg/dL - 882  -  Hemoglobin J8r 4.8 - 5.6 % 6.8  5.9  5.4   TCO2 0 - 100 mmol/L 24  - -     Exercise Target Goals: Exercise Program Goal: Individual exercise prescription set using results from initial 6 min walk test and THRR while considering  patient's activity barriers and safety.   Exercise Prescription Goal: Initial exercise prescription builds to 30-45 minutes a day of aerobic activity, 2-3 days per week.  Home exercise guidelines will be given to patient during program as part of exercise prescription that the participant will acknowledge.   Education: Aerobic Exercise: - Group verbal and visual presentation on the components of exercise prescription. Introduces F.I.T.T principle from ACSM for exercise prescriptions.  Reviews F.I.T.T. principles of aerobic exercise including progression. Written material provided at class time.   Education: Resistance Exercise: - Group verbal and visual presentation on the components of exercise prescription. Introduces F.I.T.T principle from ACSM for exercise prescriptions  Reviews F.I.T.T. principles of resistance exercise including progression. Written material provided at class time.    Education: Exercise & Equipment Safety: - Individual verbal instruction and demonstration of equipment  use and safety with use of the equipment. Flowsheet Row Cardiac Rehab from 04/05/2024 in [facility] Cardiac and Pulmonary Rehab  Date 04/05/24  Educator NT  Instruction Review Code 1- Verbalizes Understanding    Education: Exercise Physiology & General Exercise Guidelines: - Group verbal and written instruction with models to review the exercise physiology of the cardiovascular system and associated critical values. Provides general exercise guidelines with specific  guidelines to those with heart or lung disease. Written material provided at class time.   Education: Flexibility, Balance, Mind/Body Relaxation: - Group verbal and visual presentation with interactive activity on the components of exercise prescription. Introduces F.I.T.T principle from ACSM for exercise prescriptions. Reviews F.I.T.T. principles of flexibility and balance exercise training including progression. Also discusses the mind body connection.  Reviews various relaxation techniques to help reduce and manage stress (i.e. Deep breathing, progressive muscle relaxation, and visualization). Balance handout provided to take home. Written material provided at class time.   Activity Barriers & Risk Stratification:  Activity Barriers & Cardiac Risk Stratification - 04/05/24 1102       Activity Barriers & Cardiac Risk Stratification   Activity Barriers Joint Problems;Neck/Spine Problems;Deconditioning;Assistive Device;Other (comment)    Comments L hip bursitis, L3/L4 nerve impingement    Cardiac Risk Stratification High          6 Minute Walk:  6 Minute Walk     Row Name 04/05/24 1048         6 Minute Walk   Phase Initial     Distance 985 feet     Walk Time 5.3 minutes     # of Rest Breaks 2     MPH 2.11     METS 2.41     RPE 13     Perceived Dyspnea  0     VO2 Peak 8.42     Symptoms Yes (comment)     Comments Left leg pain 5/10     Resting HR 83 bpm     Resting BP 140/84     Resting Oxygen Saturation  100 %     Exercise Oxygen Saturation  during 6 min walk 98 %     Max Ex. HR 116 bpm     Max Ex. BP 168/84     2 Minute Post BP 138/76        Oxygen Initial Assessment:   Oxygen Re-Evaluation:   Oxygen Discharge (Final Oxygen Re-Evaluation):   Initial Exercise Prescription:  Initial Exercise Prescription - 04/05/24 1100       Date of Initial Exercise RX and Referring Provider   Date 04/05/24    Referring Provider Dr. Redell Cave, MD      Oxygen    Maintain Oxygen Saturation 88% or higher      Recumbant Bike   Level 2    RPM 50    Watts 12    Minutes 15    METs 2.41      NuStep   Level 2    SPM 80    Minutes 15    METs 2.41      Track   Laps 25    Minutes 15    METs 2.36      Prescription Details   Frequency (times per week) 3    Duration Progress to 30 minutes of continuous aerobic without signs/symptoms of physical distress      Intensity   THRR 40-80% of Max Heartrate 109-136    Ratings of Perceived Exertion 11-13  Perceived Dyspnea 0-4      Progression   Progression Continue to progress workloads to maintain intensity without signs/symptoms of physical distress.      Resistance Training   Training Prescription Yes    Weight 3 lb    Reps 10-15          Perform Capillary Blood Glucose checks as needed.  Exercise Prescription Changes:   Exercise Prescription Changes     Row Name 04/05/24 1100             Response to Exercise   Blood Pressure (Admit) 140/84       Blood Pressure (Exercise) 168/84       Blood Pressure (Exit) 138/76       Heart Rate (Admit) 83 bpm       Heart Rate (Exercise) 116 bpm       Heart Rate (Exit) 95 bpm       Oxygen Saturation (Admit) 100 %       Oxygen Saturation (Exercise) 98 %       Rating of Perceived Exertion (Exercise) 13       Perceived Dyspnea (Exercise) 0       Symptoms L leg pain 5/10       Comments Results          Exercise Comments:   Exercise Goals and Review:   Exercise Goals     Row Name 04/05/24 1105             Exercise Goals   Increase Physical Activity Yes       Intervention Develop an individualized exercise prescription for aerobic and resistive training based on initial evaluation findings, risk stratification, comorbidities and participant's personal goals.;Provide advice, education, support and counseling about physical activity/exercise needs.       Expected Outcomes Short Term: Attend rehab on a regular basis to increase  amount of physical activity.;Long Term: Add in home exercise to make exercise part of routine and to increase amount of physical activity.;Long Term: Exercising regularly at least 3-5 days a week.       Increase Strength and Stamina Yes       Intervention Develop an individualized exercise prescription for aerobic and resistive training based on initial evaluation findings, risk stratification, comorbidities and participant's personal goals.;Provide advice, education, support and counseling about physical activity/exercise needs.       Expected Outcomes Short Term: Increase workloads from initial exercise prescription for resistance, speed, and METs.;Long Term: Improve cardiorespiratory fitness, muscular endurance and strength as measured by increased METs and functional capacity ( );Short Term: Perform resistance training exercises routinely during rehab and add in resistance training at home       Able to understand and use rate of perceived exertion (RPE) scale Yes       Intervention Provide education and explanation on how to use RPE scale       Expected Outcomes Short Term: Able to use RPE daily in rehab to express subjective intensity level;Long Term:  Able to use RPE to guide intensity level when exercising independently       Able to understand and use Dyspnea scale Yes       Intervention Provide education and explanation on how to use Dyspnea scale       Expected Outcomes Short Term: Able to use Dyspnea scale daily in rehab to express subjective sense of shortness of breath during exertion;Long Term: Able to use Dyspnea scale to guide intensity level when exercising independently  Knowledge and understanding of Target Heart Rate Range (THRR) Yes       Intervention Provide education and explanation of THRR including how the numbers were predicted and where they are located for reference       Expected Outcomes Short Term: Able to state/look up THRR;Long Term: Able to use THRR to govern  intensity when exercising independently;Short Term: Able to use daily as guideline for intensity in rehab       Able to check pulse independently Yes       Intervention Provide education and demonstration on how to check pulse in carotid and radial arteries.;Review the importance of being able to check your own pulse for safety during independent exercise       Expected Outcomes Short Term: Able to explain why pulse checking is important during independent exercise;Long Term: Able to check pulse independently and accurately       Understanding of Exercise Prescription Yes       Intervention Provide education, explanation, and written materials on patient's individual exercise prescription       Expected Outcomes Short Term: Able to explain program exercise prescription;Long Term: Able to explain home exercise prescription to exercise independently          Exercise Goals Re-Evaluation :   Discharge Exercise Prescription (Final Exercise Prescription Changes):  Exercise Prescription Changes - 04/05/24 1100       Response to Exercise   Blood Pressure (Admit) 140/84    Blood Pressure (Exercise) 168/84    Blood Pressure (Exit) 138/76    Heart Rate (Admit) 83 bpm    Heart Rate (Exercise) 116 bpm    Heart Rate (Exit) 95 bpm    Oxygen Saturation (Admit) 100 %    Oxygen Saturation (Exercise) 98 %    Rating of Perceived Exertion (Exercise) 13    Perceived Dyspnea (Exercise) 0    Symptoms L leg pain 5/10    Comments Results          Nutrition:  Target Goals: Understanding of nutrition guidelines, daily intake of sodium 1500mg , cholesterol 200mg , calories 30% from fat and 7% or less from saturated fats, daily to have 5 or more servings of fruits and vegetables.  Education: Nutrition 1 -Group instruction provided by verbal, written material, interactive activities, discussions, models, and posters to present general guidelines for heart healthy nutrition including macronutrients,  label reading, and promoting whole foods over processed counterparts. Education serves as Pensions consultant of discussion of heart healthy eating for all. Written material provided at class time.    Education: Nutrition 2 -Group instruction provided by verbal, written material, interactive activities, discussions, models, and posters to present general guidelines for heart healthy nutrition including sodium, cholesterol, and saturated fat. Providing guidance of habit forming to improve blood pressure, cholesterol, and body weight. Written material provided at class time.     Biometrics:  Pre Biometrics - 04/05/24 1106       Pre Biometrics   Height 5' 4.5 (1.638 m)    Weight 203 lb 9.6 oz (92.4 kg)    Waist Circumference 43 inches    Hip Circumference 47 inches    Waist to Hip Ratio 0.91 %    BMI (Calculated) 34.42    Single Leg Stand 7.3 seconds           Nutrition Therapy Plan and Nutrition Goals:  Nutrition Therapy & Goals - 04/05/24 1111       Intervention Plan   Intervention Prescribe, educate and counsel regarding  individualized specific dietary modifications aiming towards targeted core components such as weight, hypertension, lipid management, diabetes, heart failure and other comorbidities.    Expected Outcomes Short Term Goal: Understand basic principles of dietary content, such as calories, fat, sodium, cholesterol and nutrients.;Short Term Goal: A plan has been developed with personal nutrition goals set during dietitian appointment.;Long Term Goal: Adherence to prescribed nutrition plan.          Nutrition Assessments:  MEDIFICTS Score Key: >=70 Need to make dietary changes  40-70 Heart Healthy Diet <= 40 Therapeutic Level Cholesterol Diet   Picture Your Plate Scores: <59 Unhealthy dietary pattern with much room for improvement. 41-50 Dietary pattern unlikely to meet recommendations for good health and room for improvement. 51-60 More healthful dietary  pattern, with some room for improvement.  >60 Healthy dietary pattern, although there may be some specific behaviors that could be improved.    Nutrition Goals Re-Evaluation:   Nutrition Goals Discharge (Final Nutrition Goals Re-Evaluation):   Psychosocial: Target Goals: Acknowledge presence or absence of significant depression and/or stress, maximize coping skills, provide positive support system. Participant is able to verbalize types and ability to use techniques and skills needed for reducing stress and depression.   Education: Stress, Anxiety, and Depression - Group verbal and visual presentation to define topics covered.  Reviews how body is impacted by stress, anxiety, and depression.  Also discusses healthy ways to reduce stress and to treat/manage anxiety and depression. Written material provided at class time.   Education: Sleep Hygiene -Provides group verbal and written instruction about how sleep can affect your health.  Define sleep hygiene, discuss sleep cycles and impact of sleep habits. Review good sleep hygiene tips.   Initial Review & Psychosocial Screening:  Initial Psych Review & Screening - 03/29/24 1402       Initial Review   Current issues with None Identified      Family Dynamics   Good Support System? Yes    Comments Patient stated that she has a good support system of family and friends. She stated she does not have concerns with depression or stress and is out of work temporarily until she is fully recovered from heart event. 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 She stated she has lost about 30 pounds. Patient is interested in completing cardiac rehab to ensure positive health outcomes.      Barriers   Psychosocial barriers to participate in program There are no identifiable barriers or psychosocial needs.      Screening Interventions   Interventions Encouraged to exercise;Provide  feedback about the scores to participant    Expected Outcomes Short Term goal: Identification and review with participant of any Quality of Life or Depression concerns found by scoring the questionnaire.;Long Term goal: The participant improves quality of Life and PHQ9 Scores as seen by post scores and/or verbalization of changes          Quality of Life Scores:   Scores of 19 and below usually indicate a poorer quality of life in these areas.  A difference of  2-3 points is a clinically meaningful difference.  A difference of 2-3 points in the total score of the Quality of Life Index has been associated with significant improvement in overall quality of life, self-image, physical symptoms, and general health in studies assessing change in quality of life.  PHQ-9: Review Flowsheet       04/05/2024 08/16/2022 08/08/2021  Depression screen PHQ 2/9  Decreased Interest 0 0 0  Down, Depressed, Hopeless 0 0 0  PHQ - 2 Score 0 0 0  Altered sleeping 0 - -  Tired, decreased energy 0 - -  Change in appetite 0 - -  Feeling bad or failure about yourself  0 - -  Trouble concentrating 0 - -  Moving slowly or fidgety/restless 0 - -  Suicidal thoughts 0 - -  PHQ-9 Score 0 - -   Interpretation of Total Score  Total Score Depression Severity:  1-4 = Minimal depression, 5-9 = Mild depression, 10-14 = Moderate depression, 15-19 = Moderately severe depression, 20-27 = Severe depression   Psychosocial Evaluation and Intervention:  Psychosocial Evaluation - 03/29/24 1405       Psychosocial Evaluation & Interventions   Interventions Relaxation education;Encouraged to exercise with the program and follow exercise prescription;Stress management education    Comments Patient stated that she has a good support system of family and friends. She stated she does not have concerns with depression or stress and is out of work temporarily until she is fully recovered from heart event. 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 She stated she has lost about 30 pounds. Patient is interested in completing cardiac rehab to ensure positive health outcomes.    Expected Outcomes ST: Attend cardiac rehab for education and exercise. LT: Develop and maintain positive self-care habits.    Continue Psychosocial Services  Follow up required by staff          Psychosocial Re-Evaluation:   Psychosocial Discharge (Final Psychosocial Re-Evaluation):   Vocational Rehabilitation: Provide vocational rehab assistance to qualifying candidates.   Vocational Rehab Evaluation & Intervention:  Vocational Rehab - 03/29/24 1401       Initial Vocational Rehab Evaluation & Intervention   Assessment shows need for Vocational Rehabilitation No          Education: Education Goals: Education classes will be provided on a variety of topics geared toward better understanding of heart health and risk factor modification. Participant will state understanding/return demonstration of topics presented as noted by education test scores.  Learning Barriers/Preferences:  Learning Barriers/Preferences - 03/29/24 1401       Learning Barriers/Preferences   Learning Barriers None    Learning Preferences Computer/Internet;Skilled Demonstration;Pictoral;Video;Written Material          General Cardiac Education Topics:  AED/CPR: - Group verbal and written instruction with the use of models to demonstrate the basic use of the AED with the basic ABC's of resuscitation.   Test and Procedures: - Group verbal and visual presentation and models provide information about basic cardiac anatomy and function. Reviews the testing methods done to diagnose heart disease and the outcomes of the test results. Describes the treatment choices: Medical Management, Angioplasty, or Coronary Bypass Surgery for treating various heart conditions including Myocardial Infarction, Angina,  Valve Disease, and Cardiac Arrhythmias. Written material provided at class time.   Medication Safety: - Group verbal and visual instruction to review commonly prescribed medications for heart and lung disease. Reviews the medication, class of the drug, and side effects. Includes the steps to properly store meds and maintain the prescription regimen. Written material provided at class time.   Intimacy: - Group verbal instruction through game format to discuss how heart and lung disease can affect sexual intimacy. Written material provided at class time.   Know Your Numbers and Heart Failure: -  Group verbal and visual instruction to discuss disease risk factors for cardiac and pulmonary disease and treatment options.  Reviews associated critical values for Overweight/Obesity, Hypertension, Cholesterol, and Diabetes.  Discusses basics of heart failure: signs/symptoms and treatments.  Introduces Heart Failure Zone chart for action plan for heart failure. Written material provided at class time.   Infection Prevention: - Provides verbal and written material to individual with discussion of infection control including proper hand washing and proper equipment cleaning during exercise session. Flowsheet Row Cardiac Rehab from 04/05/2024 in [facility] Cardiac and Pulmonary Rehab  Date 04/05/24  Educator NT  Instruction Review Code 1- Verbalizes Understanding    Falls Prevention: - Provides verbal and written material to individual with discussion of falls prevention and safety. Flowsheet Row Cardiac Rehab from 04/05/2024 in [facility] Cardiac and Pulmonary Rehab  Date 03/29/24  Educator kb  Instruction Review Code 1- Verbalizes Understanding    Other: -Provides group and verbal instruction on various topics (see comments)   Knowledge Questionnaire Score:   Core Components/Risk Factors/Patient Goals at Admission:  Personal Goals and Risk Factors at Admission - 03/29/24 1401       Core  Components/Risk Factors/Patient Goals on Admission    Weight Management Weight Loss    Hypertension Yes    Intervention Provide education on lifestyle modifcations including regular physical activity/exercise, weight management, moderate sodium restriction and increased consumption of fresh fruit, vegetables, and low fat dairy, alcohol moderation, and smoking cessation.    Expected Outcomes Long Term: Maintenance of blood pressure at goal levels.;Short Term: Continued assessment and intervention until BP is < 140/21mm HG in hypertensive participants. < 130/71mm HG in hypertensive participants with diabetes, heart failure or chronic kidney disease.    Lipids Yes    Intervention Provide education and support for participant on nutrition & aerobic/resistive exercise along with prescribed medications to achieve LDL 70mg , HDL >40mg .    Expected Outcomes Short Term: Participant states understanding of desired cholesterol values and is compliant with medications prescribed. Participant is following exercise prescription and nutrition guidelines.;Long Term: Cholesterol controlled with medications as prescribed, with individualized exercise RX and with personalized nutrition plan. Value goals: LDL < 70mg , HDL > 40 mg.          Education:Diabetes - Individual verbal and written instruction to review signs/symptoms of diabetes, desired ranges of glucose level fasting, after meals and with exercise. Acknowledge that pre and post exercise glucose checks will be done for 3 sessions at entry of program.   Core Components/Risk Factors/Patient Goals Review:    Core Components/Risk Factors/Patient Goals at Discharge (Final Review):    ITP Comments:  ITP Comments     Row Name 03/29/24 1407 04/05/24 1022         ITP Comments Initial phone call completed. Diagnosis can be found in [facility] 02/19/2024. EP Orientation scheduled for Monday, April 05, 2024 @ 0900. Completed and gym orientation for cardiac  rehab. Initial ITP created and sent for review to Dr. Oneil Pinal, Medical Director.         Comments: Initial ITP

## 2024-04-05 NOTE — Progress Notes (Signed)
 [facility] Counselor/Therapist Progress Note  Patient ID: Debbie [name], MRN: [id number]    Date: 04/05/24  Time Spent: 11:33  am - 12:23 pm : 50 Minutes  Treatment Type: Individual Therapy.  Reported Symptoms: none reported  Mental Status Exam: Appearance:  Neat and Well Groomed     Behavior: Appropriate  Motor: Normal  Speech/Language:  Clear and Coherent and Normal Rate  Affect: Appropriate  Mood: normal  Thought process: normal  Thought content:   WNL  Sensory/Perceptual disturbances:   WNL  Orientation: oriented to person, place, time/date, and situation  Attention: Good  Concentration: Good  Memory: WNL  Fund of knowledge:  Good  Insight:   Good  Judgment:  Good  Impulse Control: Good   Risk Assessment: Danger to Self:  No Patient denied current suicidal ideation  Self-injurious Behavior: No Danger to Others: No Patient denied current homicidal ideation Duty to Warn:no Physical Aggression / Violence:No  Access to Firearms a concern: No  Gang Involvement:No   Subjective:  Patient reported no changes since last session. Patient reported patient started cardiac rehab at [facility] today and will be attending cardiac rehab three times per week and physical therapy two days per week. Patient stated, excellent in response to mood since last session. Patient stated, super is response to current mood. Patient stated, no I think its appropriate based on what you shared in response to diagnosis. Patient stated, my understanding is the 10 sessions I have is going to address some individual therapy work, Im familiar with the process and I'm open in reference to participation in therapy. Patient stated, one of the goals would be to explore the shock of me having a heart attack because I don't remember having any pre symptoms/signs to notify me. Patient reported heart attack occurred during a staff meeting. Patient reported patient wants to establish new goals as  it relates to patient's nutrition and weight. Patient reported a weight goal of 170 pounds. Patient stated, I don't take good care of my self considering boundaries too, I over function. Patient stated, learn how to have fun in reference to goals.  Interventions: Motivational Interviewing. Clinician conducted session via caregility video and audio via telephone from clinician's home office due to audio component of caregility not working. Patient provided verbal consent to proceed with telehealth session and is aware of limitations of telephone or video visits. Patient participated in session from patient's home. Reviewed events since last session and assessed for changes. Clinician reviewed diagnosis and treatment recommendations. Provided psycho education related to diagnosis and treatment. Clinician utilized motivational interviewing to explore potential goals for therapy. Clinician utilized a task centered approach in collaboration with patient to develop goals for therapy. Patient participated in development of goals and agreed to goals for therapy.   Collaboration of Care: Other not required at this time  Diagnosis:  Adjustment disorder, unspecified type   Plan: Patient is to utilize Cognitive Behavioral Therapy, effective communication, and coping strategies to decrease symptoms associated with their diagnosis. Frequency: bi-weekly  Modality: individual     Long-term goal:   Increase use of self care strategies from 5 days per week to 7 days per week  Target Date: 10/03/24  Progress: established 04/05/24   Practice effective communication strategies for patient to utilize when expressing her thoughts and feelings to others and establishing boundaries in a controlled and assertive way  Target Date: 10/03/24  Progress: established 04/05/24   Short-term goal:  Verbally express an understanding of the relationship  between concern/worry related to patient's recent medical symptoms/events  and their impact on thinking patterns and behaviors. Target Date: 10/03/24  Progress: established 04/05/24                       Darice Seats, LCSW

## 2024-04-05 NOTE — Progress Notes (Signed)
 Assessment start time: 9:58 AM  Digestive issues/concerns: no known food allergies   24-hours Recall: B: 1/2 apple, primier shake L: 4oz chicken, salad D: 3oz salmon, broccoli, 1/2 cup rice Snack: half shake  Education r/t nutrition plan: Patient on zepbound  and says she has been seeing a RD at a weight loss program at [facility]. She eats small meals and says her appetite is reduced. Reminded her of the importance of eating smaller more frequent meals and snacks with nutrient dense foods. Focus on protein, with goal or ~80g per day. Encouraged her to include more healthy fats if her appetite prevents her from eating at least 1200kcal. Reviewed mediterranean diet handout. Educated on types of fats, sources, and how to read labels. Encouraged her to read labels and limit saturated fat to less than 12g per day and less than 1500mg  of sodium per day. Brainstormed several small meals and snacks with foods she likes and will eat.  Goal 1: Eat 3 times per day, small frequent meals or nutrient dense snacks  Goal 2: Eat 15-30gProtein and 30-60gCarbs at each meal. Goal 3: Read labels and reduce sodium intake to below 2300mg . Ideally 1500mg  per day.   End time 10:22 AM

## 2024-04-05 NOTE — Patient Instructions (Signed)
 Patient Instructions  Patient Details  Name: Debbie [name] MRN: [id number] Date of Birth: October 31, 1955 Referring Provider:  Darliss Rogue, MD  Below are your personal goals for exercise, nutrition, and risk factors. Our goal is to help you stay on track towards obtaining and maintaining these goals. We will be discussing your progress on these goals with you throughout the program.  Initial Exercise Prescription:  Initial Exercise Prescription - 04/05/24 1100       Date of Initial Exercise RX and Referring Provider   Date 04/05/24    Referring Provider Dr. Rogue Darliss, MD      Oxygen   Maintain Oxygen Saturation 88% or higher      Recumbant Bike   Level 2    RPM 50    Watts 12    Minutes 15    METs 2.41      NuStep   Level 2    SPM 80    Minutes 15    METs 2.41      Track   Laps 25    Minutes 15    METs 2.36      Prescription Details   Frequency (times per week) 3    Duration Progress to 30 minutes of continuous aerobic without signs/symptoms of physical distress      Intensity   THRR 40-80% of Max Heartrate 109-136    Ratings of Perceived Exertion 11-13    Perceived Dyspnea 0-4      Progression   Progression Continue to progress workloads to maintain intensity without signs/symptoms of physical distress.      Resistance Training   Training Prescription Yes    Weight 3 lb    Reps 10-15          Exercise Goals: Frequency: Be able to perform aerobic exercise two to three times per week in program working toward 2-5 days per week of home exercise.  Intensity: Work with a perceived exertion of 11 (fairly light) - 15 (hard) while following your exercise prescription.  We will make changes to your prescription with you as you progress through the program.   Duration: Be able to do 30 to 45 minutes of continuous aerobic exercise in addition to a 5 minute warm-up and a 5 minute cool-down routine.   Nutrition Goals: Your personal nutrition goals will  be established when you do your nutrition analysis with the dietician.  The following are general nutrition guidelines to follow: Cholesterol < 200mg /day Sodium < 1500mg /day Fiber: Women over 50 yrs - 21 grams per day  Personal Goals:  Personal Goals and Risk Factors at Admission - 03/29/24 1401       Core Components/Risk Factors/Patient Goals on Admission    Weight Management Weight Loss    Hypertension Yes    Intervention Provide education on lifestyle modifcations including regular physical activity/exercise, weight management, moderate sodium restriction and increased consumption of fresh fruit, vegetables, and low fat dairy, alcohol moderation, and smoking cessation.    Expected Outcomes Long Term: Maintenance of blood pressure at goal levels.;Short Term: Continued assessment and intervention until BP is < 140/75mm HG in hypertensive participants. < 130/75mm HG in hypertensive participants with diabetes, heart failure or chronic kidney disease.    Lipids Yes    Intervention Provide education and support for participant on nutrition & aerobic/resistive exercise along with prescribed medications to achieve LDL 70mg , HDL >40mg .    Expected Outcomes Short Term: Participant states understanding of desired cholesterol values and is compliant with medications prescribed.  Participant is following exercise prescription and nutrition guidelines.;Long Term: Cholesterol controlled with medications as prescribed, with individualized exercise RX and with personalized nutrition plan. Value goals: LDL < 70mg , HDL > 40 mg.         Exercise Goals and Review:  Exercise Goals     Row Name 04/05/24 1105             Exercise Goals   Increase Physical Activity Yes       Intervention Develop an individualized exercise prescription for aerobic and resistive training based on initial evaluation findings, risk stratification, comorbidities and participant's personal goals.;Provide advice, education,  support and counseling about physical activity/exercise needs.       Expected Outcomes Short Term: Attend rehab on a regular basis to increase amount of physical activity.;Long Term: Add in home exercise to make exercise part of routine and to increase amount of physical activity.;Long Term: Exercising regularly at least 3-5 days a week.       Increase Strength and Stamina Yes       Intervention Develop an individualized exercise prescription for aerobic and resistive training based on initial evaluation findings, risk stratification, comorbidities and participant's personal goals.;Provide advice, education, support and counseling about physical activity/exercise needs.       Expected Outcomes Short Term: Increase workloads from initial exercise prescription for resistance, speed, and METs.;Long Term: Improve cardiorespiratory fitness, muscular endurance and strength as measured by increased METs and functional capacity ( );Short Term: Perform resistance training exercises routinely during rehab and add in resistance training at home       Able to understand and use rate of perceived exertion (RPE) scale Yes       Intervention Provide education and explanation on how to use RPE scale       Expected Outcomes Short Term: Able to use RPE daily in rehab to express subjective intensity level;Long Term:  Able to use RPE to guide intensity level when exercising independently       Able to understand and use Dyspnea scale Yes       Intervention Provide education and explanation on how to use Dyspnea scale       Expected Outcomes Short Term: Able to use Dyspnea scale daily in rehab to express subjective sense of shortness of breath during exertion;Long Term: Able to use Dyspnea scale to guide intensity level when exercising independently       Knowledge and understanding of Target Heart Rate Range (THRR) Yes       Intervention Provide education and explanation of THRR including how the numbers were predicted  and where they are located for reference       Expected Outcomes Short Term: Able to state/look up THRR;Long Term: Able to use THRR to govern intensity when exercising independently;Short Term: Able to use daily as guideline for intensity in rehab       Able to check pulse independently Yes       Intervention Provide education and demonstration on how to check pulse in carotid and radial arteries.;Review the importance of being able to check your own pulse for safety during independent exercise       Expected Outcomes Short Term: Able to explain why pulse checking is important during independent exercise;Long Term: Able to check pulse independently and accurately       Understanding of Exercise Prescription Yes       Intervention Provide education, explanation, and written materials on patient's individual exercise prescription  Expected Outcomes Short Term: Able to explain program exercise prescription;Long Term: Able to explain home exercise prescription to exercise independently

## 2024-04-07 ENCOUNTER — Encounter: Attending: Cardiology

## 2024-04-07 DIAGNOSIS — Z955 Presence of coronary angioplasty implant and graft: Secondary | ICD-10-CM | POA: Insufficient documentation

## 2024-04-07 DIAGNOSIS — I213 ST elevation (STEMI) myocardial infarction of unspecified site: Secondary | ICD-10-CM | POA: Insufficient documentation

## 2024-04-07 NOTE — Progress Notes (Signed)
 Daily Session Note  Patient Details  Name: Debbie [name] MRN: [id number] Date of Birth: [birth year] Referring Provider:   Flowsheet Row Cardiac Rehab from 04/05/2024 in [facility] Cardiac and Pulmonary Rehab  Referring Provider Dr. Redell Cave, MD    Encounter Date: 04/07/2024  Check In:  Session Check In - 04/07/24 0721       Check-In   Supervising physician immediately available to respond to emergencies See telemetry face sheet for immediately available ER MD    Location [facility]-Cardiac & Pulmonary Rehab    Staff Present Burnard Davenport [facility] Peggi, RN, DNP, NE-BC;Joseph [facility] Best, MS, Exercise Physiologist    Virtual Visit No    Medication changes reported     No    Fall or balance concerns reported    No    Warm-up and Cool-down Performed on first and last piece of equipment    Resistance Training Performed Yes    VAD Patient? No    PAD/SET Patient? No      Pain Assessment   Currently in Pain? No/denies             Social History   Tobacco Use  Smoking Status Never  Smokeless Tobacco Never    Goals Met:  Independence with exercise equipment Exercise tolerated well No report of concerns or symptoms today Strength training completed today  Goals Unmet:  Not Applicable  Comments: First full day of exercise!  Patient was oriented to gym and equipment including functions, settings, policies, and procedures.  Patient's individual exercise prescription and treatment plan were reviewed.  All starting workloads were established based on the results of the 6 minute walk test done at initial orientation visit.  The plan for exercise progression was also introduced and progression will be customized based on patient's performance and goals.    Dr. Oneil Pinal is Medical Director for [facility] Cardiac Rehabilitation.  Dr. Fuad Aleskerov is Medical Director for [facility] Pulmonary Rehabilitation.

## 2024-04-08 ENCOUNTER — Other Ambulatory Visit (HOSPITAL_BASED_OUTPATIENT_CLINIC_OR_DEPARTMENT_OTHER): Payer: Self-pay

## 2024-04-08 ENCOUNTER — Telehealth: Payer: Self-pay | Admitting: Cardiology

## 2024-04-08 NOTE — Telephone Encounter (Signed)
 Pt is having trouble faxing forms, she would like to know if she can email them. Please advise.

## 2024-04-08 NOTE — Telephone Encounter (Signed)
 Returned pt call.  She states that she was not trying to fax information.  She stated that she called inquiring about to obtain email address to send lab results from her PCP to Dr. Darliss.  She was told by triage that she was unable to send information via email.  She called her PCP and his office is going to send her lab results to our office.   She thanked me for the follow up phone call,

## 2024-04-09 ENCOUNTER — Encounter

## 2024-04-09 DIAGNOSIS — I213 ST elevation (STEMI) myocardial infarction of unspecified site: Secondary | ICD-10-CM | POA: Diagnosis not present

## 2024-04-09 DIAGNOSIS — Z955 Presence of coronary angioplasty implant and graft: Secondary | ICD-10-CM

## 2024-04-09 NOTE — Progress Notes (Signed)
 Daily Session Note  Patient Details  Name: Debbie [name] MRN: [id number] Date of Birth: [birth year] Referring Provider:   Flowsheet Row Cardiac Rehab from 04/05/2024 in [facility] Cardiac and Pulmonary Rehab  Referring Provider Dr. Redell Cave, MD    Encounter Date: 04/09/2024  Check In:  Session Check In - 04/09/24 0717       Check-In   Supervising physician immediately available to respond to emergencies See telemetry face sheet for immediately available ER MD    Location [facility]-Cardiac & Pulmonary Rehab    Staff Present Burnard Davenport RN,BSN,MPA;Laura Cates RN,BSN;Joseph Rolinda RCP,RRT,BSRT    Virtual Visit No    Medication changes reported     No    Fall or balance concerns reported    No    Warm-up and Cool-down Performed on first and last piece of equipment    Resistance Training Performed Yes    VAD Patient? No    PAD/SET Patient? No      Pain Assessment   Currently in Pain? No/denies             Social History   Tobacco Use  Smoking Status Never  Smokeless Tobacco Never    Goals Met:  Independence with exercise equipment Exercise tolerated well No report of concerns or symptoms today Strength training completed today  Goals Unmet:  Not Applicable  Comments: Pt able to follow exercise prescription today without complaint.  Will continue to monitor for progression.    Dr. Oneil Pinal is Medical Director for [facility] Cardiac Rehabilitation.  Dr. Fuad Aleskerov is Medical Director for [facility] Pulmonary Rehabilitation.

## 2024-04-12 ENCOUNTER — Encounter

## 2024-04-12 DIAGNOSIS — I213 ST elevation (STEMI) myocardial infarction of unspecified site: Secondary | ICD-10-CM | POA: Diagnosis not present

## 2024-04-12 DIAGNOSIS — Z955 Presence of coronary angioplasty implant and graft: Secondary | ICD-10-CM

## 2024-04-12 NOTE — Progress Notes (Signed)
 Daily Session Note  Patient Details  Name: Genna Casimir MRN: [id number] Date of Birth: Dec 30, 1955 Referring Provider:   Flowsheet Row Cardiac Rehab from 04/05/2024 in [facility] Cardiac and Pulmonary Rehab  Referring Provider Dr. Redell Cave, MD    Encounter Date: 04/12/2024  Check In:  Session Check In - 04/12/24 0722       Check-In   Supervising physician immediately available to respond to emergencies See telemetry face sheet for immediately available ER MD    Location [facility]-Cardiac & Pulmonary Rehab    Staff Present Burnard Davenport [facility] Dyane BS, ACSM CEP, Exercise Physiologist;Joseph Rolinda RCP,RRT,BSRT;Jason Elnor RDN,LDN    Virtual Visit No    Medication changes reported     No    Fall or balance concerns reported    No    Warm-up and Cool-down Performed on first and last piece of equipment    Resistance Training Performed Yes    VAD Patient? No    PAD/SET Patient? No      Pain Assessment   Currently in Pain? No/denies             Social History   Tobacco Use  Smoking Status Never  Smokeless Tobacco Never    Goals Met:  Independence with exercise equipment Exercise tolerated well No report of concerns or symptoms today Strength training completed today  Goals Unmet:  Not Applicable  Comments: Pt able to follow exercise prescription today without complaint.  Will continue to monitor for progression.    Dr. Oneil Pinal is Medical Director for [facility] Cardiac Rehabilitation.  Dr. Fuad Aleskerov is Medical Director for [facility] Pulmonary Rehabilitation.

## 2024-04-14 ENCOUNTER — Encounter

## 2024-04-14 DIAGNOSIS — I213 ST elevation (STEMI) myocardial infarction of unspecified site: Secondary | ICD-10-CM | POA: Diagnosis not present

## 2024-04-14 DIAGNOSIS — Z955 Presence of coronary angioplasty implant and graft: Secondary | ICD-10-CM

## 2024-04-14 NOTE — Progress Notes (Signed)
 Daily Session Note  Patient Details  Name: Debbie [name] MRN: [id number] Date of Birth: 1956/02/20 Referring Provider:   Flowsheet Row Cardiac Rehab from 04/05/2024 in [facility] Cardiac and Pulmonary Rehab  Referring Provider Dr. Redell Cave, MD    Encounter Date: 04/14/2024  Check In:  Session Check In - 04/14/24 0724       Check-In   Supervising physician immediately available to respond to emergencies See telemetry face sheet for immediately available ER MD    Location [facility]-Cardiac & Pulmonary Rehab    Staff Present Burnard Davenport RN,BSN,MPA;Joseph Hood RCP,RRT,BSRT;Maxon Burnell BS, Exercise Physiologist;Margaret Best, MS, Exercise Physiologist    Virtual Visit No    Medication changes reported     No    Fall or balance concerns reported    No    Warm-up and Cool-down Performed on first and last piece of equipment    Resistance Training Performed Yes    VAD Patient? No    PAD/SET Patient? No      Pain Assessment   Currently in Pain? No/denies             Social History   Tobacco Use  Smoking Status Never  Smokeless Tobacco Never    Goals Met:  Independence with exercise equipment Exercise tolerated well No report of concerns or symptoms today Strength training completed today  Goals Unmet:  Not Applicable  Comments: Pt able to follow exercise prescription today without complaint.  Will continue to monitor for progression.    Dr. Oneil Pinal is Medical Director for [facility] Cardiac Rehabilitation.  Dr. Fuad Aleskerov is Medical Director for [facility] Pulmonary Rehabilitation.

## 2024-04-16 ENCOUNTER — Encounter: Admitting: *Deleted

## 2024-04-16 ENCOUNTER — Other Ambulatory Visit (HOSPITAL_BASED_OUTPATIENT_CLINIC_OR_DEPARTMENT_OTHER): Payer: Self-pay

## 2024-04-16 DIAGNOSIS — I213 ST elevation (STEMI) myocardial infarction of unspecified site: Secondary | ICD-10-CM

## 2024-04-16 DIAGNOSIS — Z955 Presence of coronary angioplasty implant and graft: Secondary | ICD-10-CM

## 2024-04-16 NOTE — Progress Notes (Signed)
 Daily Session Note  Patient Details  Name: Debbie [name] MRN: [id number] Date of Birth: April 26, 1956 Referring Provider:   Flowsheet Row Cardiac Rehab from 04/05/2024 in [facility] Cardiac and Pulmonary Rehab  Referring Provider Dr. Redell Cave, MD    Encounter Date: 04/16/2024  Check In:  Session Check In - 04/16/24 0751       Check-In   Supervising physician immediately available to respond to emergencies See telemetry face sheet for immediately available ER MD    Location [facility]-Cardiac & Pulmonary Rehab    Staff Present Ronal Picket, RN, DNP, NE-BC;Noah Tickle, BS, Exercise Physiologist;Kaedan Richert RN,BSN;Maxon Conetta BS, Exercise Physiologist    Virtual Visit No    Medication changes reported     No    Fall or balance concerns reported    No    Tobacco Cessation No Change    Warm-up and Cool-down Performed on first and last piece of equipment    Resistance Training Performed Yes    VAD Patient? No    PAD/SET Patient? No      Pain Assessment   Currently in Pain? No/denies             Social History   Tobacco Use  Smoking Status Never  Smokeless Tobacco Never    Goals Met:  Independence with exercise equipment Exercise tolerated well No report of concerns or symptoms today Strength training completed today  Goals Unmet:  Not Applicable  Comments: Pt able to follow exercise prescription today without complaint.  Will continue to monitor for progression.    Dr. Oneil Pinal is Medical Director for [facility] Cardiac Rehabilitation.  Dr. Fuad Aleskerov is Medical Director for [facility] Pulmonary Rehabilitation.

## 2024-04-19 ENCOUNTER — Encounter

## 2024-04-19 DIAGNOSIS — I213 ST elevation (STEMI) myocardial infarction of unspecified site: Secondary | ICD-10-CM

## 2024-04-19 DIAGNOSIS — Z955 Presence of coronary angioplasty implant and graft: Secondary | ICD-10-CM

## 2024-04-19 NOTE — Progress Notes (Signed)
 Daily Session Note  Patient Details  Name: Debbie [name] MRN: [id number] Date of Birth: [birth year] Referring Provider:   Flowsheet Row Cardiac Rehab from 04/05/2024 in [facility] Cardiac and Pulmonary Rehab  Referring Provider Dr. Redell Cave, MD    Encounter Date: 04/19/2024  Check In:  Session Check In - 04/19/24 0718       Check-In   Supervising physician immediately available to respond to emergencies See telemetry face sheet for immediately available ER MD    Location [facility]-Cardiac & Pulmonary Rehab    Staff Present Burnard Davenport RN,BSN,MPA;Joseph [facility] Dyane BS, ACSM CEP, Exercise Physiologist    Virtual Visit No    Medication changes reported     No    Fall or balance concerns reported    No    Tobacco Cessation No Change    Warm-up and Cool-down Performed on first and last piece of equipment    Resistance Training Performed Yes    VAD Patient? No    PAD/SET Patient? No      Pain Assessment   Currently in Pain? No/denies             Social History   Tobacco Use  Smoking Status Never  Smokeless Tobacco Never    Goals Met:  Independence with exercise equipment Exercise tolerated well No report of concerns or symptoms today Strength training completed today  Goals Unmet:  Not Applicable  Comments: Pt able to follow exercise prescription today without complaint.  Will continue to monitor for progression.    Dr. Oneil Pinal is Medical Director for [facility] Cardiac Rehabilitation.  Dr. Fuad Aleskerov is Medical Director for [facility] Pulmonary Rehabilitation.

## 2024-04-21 ENCOUNTER — Encounter

## 2024-04-21 DIAGNOSIS — I213 ST elevation (STEMI) myocardial infarction of unspecified site: Secondary | ICD-10-CM | POA: Diagnosis not present

## 2024-04-21 DIAGNOSIS — Z955 Presence of coronary angioplasty implant and graft: Secondary | ICD-10-CM

## 2024-04-21 NOTE — Progress Notes (Signed)
 Daily Session Note  Patient Details  Name: Debbie [name] MRN: [id number] Date of Birth: [birth year] Referring Provider:   Flowsheet Row Cardiac Rehab from 04/05/2024 in [facility] Cardiac and Pulmonary Rehab  Referring Provider Dr. Redell Cave, MD    Encounter Date: 04/21/2024  Check In:  Session Check In - 04/21/24 0734       Check-In   Supervising physician immediately available to respond to emergencies See telemetry face sheet for immediately available ER MD    Location [facility]-Cardiac & Pulmonary Rehab    Staff Present Burnard Davenport RN,BSN,MPA;Joseph [employer] RCP,RRT,BSRT;Margaret Best, MS, Exercise Physiologist;Jason Elnor RDN,LDN    Virtual Visit No    Medication changes reported     No    Fall or balance concerns reported    No    Tobacco Cessation No Change    Warm-up and Cool-down Performed on first and last piece of equipment    Resistance Training Performed Yes    VAD Patient? No    PAD/SET Patient? No      Pain Assessment   Currently in Pain? No/denies             Social History   Tobacco Use  Smoking Status Never  Smokeless Tobacco Never    Goals Met:  Independence with exercise equipment Exercise tolerated well No report of concerns or symptoms today Strength training completed today  Goals Unmet:  Not Applicable  Comments: Pt able to follow exercise prescription today without complaint.  Will continue to monitor for progression.    Dr. Oneil Pinal is Medical Director for [facility] Cardiac Rehabilitation.  Dr. Fuad Aleskerov is Medical Director for [facility] Pulmonary Rehabilitation.

## 2024-04-23 ENCOUNTER — Encounter

## 2024-04-23 DIAGNOSIS — Z955 Presence of coronary angioplasty implant and graft: Secondary | ICD-10-CM

## 2024-04-23 DIAGNOSIS — I213 ST elevation (STEMI) myocardial infarction of unspecified site: Secondary | ICD-10-CM | POA: Diagnosis not present

## 2024-04-23 NOTE — Progress Notes (Signed)
 Daily Session Note  Patient Details  Name: Debbie [name] MRN: [id number] Date of Birth: 06/10/1956 Referring Provider:   Flowsheet Row Cardiac Rehab from 04/05/2024 in [facility] Cardiac and Pulmonary Rehab  Referring Provider Dr. Redell Cave, MD    Encounter Date: 04/23/2024  Check In:  Session Check In - 04/23/24 0718       Check-In   Supervising physician immediately available to respond to emergencies See telemetry face sheet for immediately available ER MD    Location [facility]-Cardiac & Pulmonary Rehab    Staff Present Burnard Davenport RN,BSN,MPA;Laureen Delores, BS, RRT, CPFT;Noah Tickle, BS, Exercise Physiologist    Virtual Visit No    Medication changes reported     No    Fall or balance concerns reported    No    Tobacco Cessation No Change    Warm-up and Cool-down Performed on first and last piece of equipment    Resistance Training Performed Yes    VAD Patient? No    PAD/SET Patient? No      Pain Assessment   Currently in Pain? No/denies             Social History   Tobacco Use  Smoking Status Never  Smokeless Tobacco Never    Goals Met:  Independence with exercise equipment Exercise tolerated well No report of concerns or symptoms today Strength training completed today  Goals Unmet:  Not Applicable  Comments: Pt able to follow exercise prescription today without complaint.  Will continue to monitor for progression.    Dr. Oneil Pinal is Medical Director for [facility] Cardiac Rehabilitation.  Dr. Fuad Aleskerov is Medical Director for [facility] Pulmonary Rehabilitation.

## 2024-04-26 ENCOUNTER — Encounter

## 2024-04-26 ENCOUNTER — Ambulatory Visit: Payer: PRIVATE HEALTH INSURANCE | Admitting: Clinical

## 2024-04-26 DIAGNOSIS — F4320 Adjustment disorder, unspecified: Secondary | ICD-10-CM

## 2024-04-26 DIAGNOSIS — Z955 Presence of coronary angioplasty implant and graft: Secondary | ICD-10-CM

## 2024-04-26 DIAGNOSIS — I213 ST elevation (STEMI) myocardial infarction of unspecified site: Secondary | ICD-10-CM

## 2024-04-26 NOTE — Progress Notes (Signed)
   Darice Seats, LCSW

## 2024-04-26 NOTE — Progress Notes (Signed)
 Daily Session Note  Patient Details  Name: Debbie [name] MRN: [id number] Date of Birth: May 01, 1956 Referring Provider:   Flowsheet Row Cardiac Rehab from 04/05/2024 in [facility] Cardiac and Pulmonary Rehab  Referring Provider Dr. Redell Cave, MD    Encounter Date: 04/26/2024  Check In:  Session Check In - 04/26/24 0722       Check-In   Supervising physician immediately available to respond to emergencies See telemetry face sheet for immediately available ER MD    Location [facility]-Cardiac & Pulmonary Rehab    Staff Present Burnard Davenport [facility] Dyane BS, ACSM CEP, Exercise Physiologist;Joseph Rolinda RCP,RRT,BSRT    Virtual Visit No    Medication changes reported     No    Fall or balance concerns reported    No    Tobacco Cessation No Change    Warm-up and Cool-down Performed on first and last piece of equipment    Resistance Training Performed Yes    VAD Patient? No    PAD/SET Patient? No      Pain Assessment   Currently in Pain? No/denies             Social History   Tobacco Use  Smoking Status Never  Smokeless Tobacco Never    Goals Met:  Independence with exercise equipment Exercise tolerated well No report of concerns or symptoms today Strength training completed today  Goals Unmet:  Not Applicable  Comments: Pt able to follow exercise prescription today without complaint.  Will continue to monitor for progression.    Dr. Oneil Pinal is Medical Director for [facility] Cardiac Rehabilitation.  Dr. Fuad Aleskerov is Medical Director for [facility] Pulmonary Rehabilitation.

## 2024-04-26 NOTE — Progress Notes (Signed)
 [facility] Counselor/Therapist Progress Note  Patient ID: Debbie [name], MRN: [id number],    Date: 04/26/2024  Time Spent: 1:34pm - 2:27pm : 53 minutes   Treatment Type: Individual Therapy  Reported Symptoms: none reported  Mental Status Exam: Appearance:  Neat and Well Groomed     Behavior: Appropriate  Motor: Normal  Speech/Language:  Clear and Coherent and Normal Rate  Affect: Appropriate  Mood: normal  Thought process: normal  Thought content:   WNL  Sensory/Perceptual disturbances:   WNL  Orientation: oriented to person, place, time/date, and situation  Attention: Good  Concentration: Good  Memory: WNL  Fund of knowledge:  Good  Insight:   Good  Judgment:  Good  Impulse Control: Good   Risk Assessment: Danger to Self:  No Patient denied current suicidal ideation  Self-injurious Behavior: No Danger to Others: No Patient denied current homicidal ideation Duty to Warn:no Physical Aggression / Violence:No  Access to Firearms a concern: No  Gang Involvement:No   Subjective: Patient stated, since we last talked, I continue my cardiac rehab and my physical therapy. Patient reported patient's employer does not anticipate patient returning to work until July 08, 2024 and patient stated, I think that is very reasonable. Patient reported patient spoke with a leadership staff member today regarding patient's return to work. Patient reported if patient's therapies are extended patient will have to commute to work. Patient reported I've done some reflecting and touched base with my senior warden, I think one of the things I really need to work on in reference to focus on self care. Patient reported feeling patient needs to set a schedule for self care and disconnect from the church on patient's days off. Patient reported previously patient was not taking a lunch or disconnecting from the office. Patient reported patient did not establish boundaries regarding  meeting with others. Patient stated, I tend to take on a lot of people's stressors. Patient stated, I am going to have to delegate some things or cut back on some things. Patient stated, my mood has been very good.   Interventions: Cognitive Behavioral Therapy. Clinician conducted session via caregility video from clinician's home office. Patient provided verbal consent to proceed with telehealth session and is aware of limitations of telephone or video visits. Patient participated in session from patient's home. Reviewed events since last session and assessed for changes. Discussed status of patient's participation in cardiac rehab and physical therapy. Explored patient's thoughts and feelings related to returning to work. Provided psycho education related to self care. Discussed costs associated with refraining from establishing boundaries and practicing self care. Discussed strategies to increase self care, such as, scheduling time in the morning for self care, rescheduling meetings for work days, setting expectations and boundaries as it relates to meetings.    Collaboration of Care: Other not required at this time   Diagnosis:  Adjustment disorder, unspecified type     Plan: Patient is to utilize Cognitive Behavioral Therapy, effective communication, and coping strategies to decrease symptoms associated with their diagnosis. Frequency: bi-weekly  Modality: individual      Long-term goal:   Increase use of self care strategies from 5 days per week to 7 days per week  Target Date: 10/03/24  Progress: progressing    Practice effective communication strategies for patient to utilize when expressing her thoughts and feelings to others and establishing boundaries in a controlled and assertive way  Target Date: 10/03/24  Progress: progressing    Short-term goal:  Verbally  express an understanding of the relationship between concern/worry related to patient's recent medical symptoms/events  and their impact on thinking patterns and behaviors. Target Date: 10/03/24  Progress: progressing                                    Darice Seats, LCSW

## 2024-04-28 ENCOUNTER — Encounter

## 2024-04-28 DIAGNOSIS — Z955 Presence of coronary angioplasty implant and graft: Secondary | ICD-10-CM

## 2024-04-28 DIAGNOSIS — I213 ST elevation (STEMI) myocardial infarction of unspecified site: Secondary | ICD-10-CM

## 2024-04-28 NOTE — Progress Notes (Signed)
 Cardiac Individual Treatment Plan  Patient Details  Name: Debbie [name] MRN: [id number] Date of Birth: [birth year] Referring Provider:   Flowsheet Row Cardiac Rehab from 04/05/2024 in [facility] Cardiac and Pulmonary Rehab  Referring Provider Dr. Redell Cave, MD    Initial Encounter Date:  Flowsheet Row Cardiac Rehab from 04/05/2024 in [facility] Cardiac and Pulmonary Rehab  Date 04/05/24    Visit Diagnosis: ST elevation myocardial infarction (STEMI), unspecified artery [facility])  Status post coronary artery stent placement  Patient's Home Medications on Admission:  Current Outpatient Medications:    aspirin  EC 81 MG tablet, Take 1 tablet (81 mg total) by mouth daily., Disp: 30 tablet, Rfl: 3   docusate sodium  (COLACE) 100 MG capsule, Take 100 mg by mouth 2 (two) times daily., Disp: , Rfl:    metoprolol  succinate (TOPROL -XL) 50 MG 24 hr tablet, Take 1 tablet (50 mg total) by mouth daily., Disp: 30 tablet, Rfl: 3   mupirocin ointment (BACTROBAN) 2 %, Apply 1 Application topically 2 (two) times daily., Disp: , Rfl:    pantoprazole  (PROTONIX ) 40 MG tablet, Take 40 mg by mouth daily before breakfast. (Patient not taking: Reported on 03/29/2024), Disp: , Rfl:    rosuvastatin  (CRESTOR ) 40 MG tablet, Take 1 tablet (40 mg total) by mouth at bedtime., Disp: 30 tablet, Rfl: 3   ticagrelor  (BRILINTA ) 90 MG TABS tablet, Take 1 tablet (90 mg total) by mouth 2 (two) times daily., Disp: 60 tablet, Rfl: 3   tirzepatide  (ZEPBOUND ) 15 MG/0.5ML Pen, Inject 15 mg into the skin once a week., Disp: 2 mL, Rfl: 2  Past Medical History: Past Medical History:  Diagnosis Date   Genital warts    Hyperlipidemia    Hypertension    Myocardial infarction [facility])    Peripheral arterial disease    Status post angioplasty with stent    Known femoral stent occlusion    Tobacco Use: Social History   Tobacco Use  Smoking Status Never  Smokeless Tobacco Never    Labs: Review Flowsheet       Latest Ref Rng & Units  08/09/2011 11/28/2017 02/20/2024  Labs for ITP Cardiac and Pulmonary Rehab  Cholestrol 100 - 199 mg/dL - 791  -  LDL (calc) 0 - 99 mg/dL - 861  -  HDL-C >60 mg/dL - 47  -  Trlycerides 0 - 149 mg/dL - 882  -  Hemoglobin J8r 4.8 - 5.6 % 6.8  5.9  5.4   TCO2 0 - 100 mmol/L 24  - -     Exercise Target Goals: Exercise Program Goal: Individual exercise prescription set using results from initial 6 min walk test and THRR while considering  patient's activity barriers and safety.   Exercise Prescription Goal: Initial exercise prescription builds to 30-45 minutes a day of aerobic activity, 2-3 days per week.  Home exercise guidelines will be given to patient during program as part of exercise prescription that the participant will acknowledge.   Education: Aerobic Exercise: - Group verbal and visual presentation on the components of exercise prescription. Introduces F.I.T.T principle from ACSM for exercise prescriptions.  Reviews F.I.T.T. principles of aerobic exercise including progression. Written material provided at class time. Flowsheet Row Cardiac Rehab from 04/28/2024 in [facility] Cardiac and Pulmonary Rehab  Date 04/21/24  Educator nt  Instruction Review Code 1- TEFL teacher Understanding    Education: Resistance Exercise: - Group verbal and visual presentation on the components of exercise prescription. Introduces F.I.T.T principle from ACSM for exercise prescriptions  Reviews F.I.T.T. principles of  resistance exercise including progression. Written material provided at class time. Flowsheet Row Cardiac Rehab from 04/28/2024 in [facility] Cardiac and Pulmonary Rehab  Date 04/14/24  Educator nt  Instruction Review Code 1- TEFL teacher Understanding     Education: Exercise & Equipment Safety: - Individual verbal instruction and demonstration of equipment use and safety with use of the equipment. Flowsheet Row Cardiac Rehab from 04/28/2024 in [facility] Cardiac and Pulmonary Rehab  Date 04/05/24  Educator  NT  Instruction Review Code 1- Verbalizes Understanding    Education: Exercise Physiology & General Exercise Guidelines: - Group verbal and written instruction with models to review the exercise physiology of the cardiovascular system and associated critical values. Provides general exercise guidelines with specific guidelines to those with heart or lung disease. Written material provided at class time. Flowsheet Row Cardiac Rehab from 04/28/2024 in [facility] Cardiac and Pulmonary Rehab  Date 04/07/24  Educator nt  Instruction Review Code 1- TEFL teacher Understanding    Education: Flexibility, Balance, Mind/Body Relaxation: - Group verbal and visual presentation with interactive activity on the components of exercise prescription. Introduces F.I.T.T principle from ACSM for exercise prescriptions. Reviews F.I.T.T. principles of flexibility and balance exercise training including progression. Also discusses the mind body connection.  Reviews various relaxation techniques to help reduce and manage stress (i.e. Deep breathing, progressive muscle relaxation, and visualization). Balance handout provided to take home. Written material provided at class time. Flowsheet Row Cardiac Rehab from 04/28/2024 in [facility] Cardiac and Pulmonary Rehab  Date 04/14/24  Educator nt  Instruction Review Code 1- Verbalizes Understanding    Activity Barriers & Risk Stratification:  Activity Barriers & Cardiac Risk Stratification - 04/05/24 1102       Activity Barriers & Cardiac Risk Stratification   Activity Barriers Joint Problems;Neck/Spine Problems;Deconditioning;Assistive Device;Other (comment)    Comments L hip bursitis, L3/L4 nerve impingement    Cardiac Risk Stratification High          6 Minute Walk:  6 Minute Walk     Row Name 04/05/24 1048         6 Minute Walk   Phase Initial     Distance 985 feet     Walk Time 5.3 minutes     # of Rest Breaks 2     MPH 2.11     METS 2.41     RPE 13      Perceived Dyspnea  0     VO2 Peak 8.42     Symptoms Yes (comment)     Comments Left leg pain 5/10     Resting HR 83 bpm     Resting BP 140/84     Resting Oxygen Saturation  100 %     Exercise Oxygen Saturation  during 6 min walk 98 %     Max Ex. HR 116 bpm     Max Ex. BP 168/84     2 Minute Post BP 138/76        Oxygen Initial Assessment:   Oxygen Re-Evaluation:   Oxygen Discharge (Final Oxygen Re-Evaluation):   Initial Exercise Prescription:  Initial Exercise Prescription - 04/05/24 1100       Date of Initial Exercise RX and Referring Provider   Date 04/05/24    Referring Provider Dr. Redell Cave, MD      Oxygen   Maintain Oxygen Saturation 88% or higher      Recumbant Bike   Level 2    RPM 50    Watts 12    Minutes 15  METs 2.41      NuStep   Level 2    SPM 80    Minutes 15    METs 2.41      Track   Laps 25    Minutes 15    METs 2.36      Prescription Details   Frequency (times per week) 3    Duration Progress to 30 minutes of continuous aerobic without signs/symptoms of physical distress      Intensity   THRR 40-80% of Max Heartrate 109-136    Ratings of Perceived Exertion 11-13    Perceived Dyspnea 0-4      Progression   Progression Continue to progress workloads to maintain intensity without signs/symptoms of physical distress.      Resistance Training   Training Prescription Yes    Weight 3 lb    Reps 10-15          Perform Capillary Blood Glucose checks as needed.  Exercise Prescription Changes:   Exercise Prescription Changes     Row Name 04/05/24 1100 04/21/24 1600           Response to Exercise   Blood Pressure (Admit) 140/84 132/64      Blood Pressure (Exercise) 168/84 160/80      Blood Pressure (Exit) 138/76 132/76      Heart Rate (Admit) 83 bpm 91 bpm      Heart Rate (Exercise) 116 bpm 131 bpm      Heart Rate (Exit) 95 bpm 79 bpm      Oxygen Saturation (Admit) 100 % --      Oxygen Saturation (Exercise) 98  % --      Rating of Perceived Exertion (Exercise) 13 15      Perceived Dyspnea (Exercise) 0 --      Symptoms L leg pain 5/10 --      Comments Results 1st 2 weeks of exercise sessions      Duration -- Progress to 30 minutes of  aerobic without signs/symptoms of physical distress      Intensity -- THRR unchanged        Progression   Progression -- Continue to progress workloads to maintain intensity without signs/symptoms of physical distress.      Average METs -- 2.69        Resistance Training   Training Prescription -- Yes      Weight -- 3 lb      Reps -- 10-15        Interval Training   Interval Training -- No        Recumbant Bike   Level -- 4      Watts -- 12      Minutes -- 15      METs -- 3.5        NuStep   Level -- 5      Minutes -- 15      METs -- 2.8        Track   Laps -- 10  hallway      Minutes -- 15      METs -- 1.54        Oxygen   Maintain Oxygen Saturation -- 88% or higher         Exercise Comments:   Exercise Comments     Row Name 04/07/24 0722           Exercise Comments First full day of exercise!  Patient was oriented to gym and equipment  including functions, settings, policies, and procedures.  Patient's individual exercise prescription and treatment plan were reviewed.  All starting workloads were established based on the results of the 6 minute walk test done at initial orientation visit.  The plan for exercise progression was also introduced and progression will be customized based on patient's performance and goals.          Exercise Goals and Review:   Exercise Goals     Row Name 04/05/24 1105             Exercise Goals   Increase Physical Activity Yes       Intervention Develop an individualized exercise prescription for aerobic and resistive training based on initial evaluation findings, risk stratification, comorbidities and participant's personal goals.;Provide advice, education, support and counseling about  physical activity/exercise needs.       Expected Outcomes Short Term: Attend rehab on a regular basis to increase amount of physical activity.;Long Term: Add in home exercise to make exercise part of routine and to increase amount of physical activity.;Long Term: Exercising regularly at least 3-5 days a week.       Increase Strength and Stamina Yes       Intervention Develop an individualized exercise prescription for aerobic and resistive training based on initial evaluation findings, risk stratification, comorbidities and participant's personal goals.;Provide advice, education, support and counseling about physical activity/exercise needs.       Expected Outcomes Short Term: Increase workloads from initial exercise prescription for resistance, speed, and METs.;Long Term: Improve cardiorespiratory fitness, muscular endurance and strength as measured by increased METs and functional capacity ( );Short Term: Perform resistance training exercises routinely during rehab and add in resistance training at home       Able to understand and use rate of perceived exertion (RPE) scale Yes       Intervention Provide education and explanation on how to use RPE scale       Expected Outcomes Short Term: Able to use RPE daily in rehab to express subjective intensity level;Long Term:  Able to use RPE to guide intensity level when exercising independently       Able to understand and use Dyspnea scale Yes       Intervention Provide education and explanation on how to use Dyspnea scale       Expected Outcomes Short Term: Able to use Dyspnea scale daily in rehab to express subjective sense of shortness of breath during exertion;Long Term: Able to use Dyspnea scale to guide intensity level when exercising independently       Knowledge and understanding of Target Heart Rate Range (THRR) Yes       Intervention Provide education and explanation of THRR including how the numbers were predicted and where they are located for  reference       Expected Outcomes Short Term: Able to state/look up THRR;Long Term: Able to use THRR to govern intensity when exercising independently;Short Term: Able to use daily as guideline for intensity in rehab       Able to check pulse independently Yes       Intervention Provide education and demonstration on how to check pulse in carotid and radial arteries.;Review the importance of being able to check your own pulse for safety during independent exercise       Expected Outcomes Short Term: Able to explain why pulse checking is important during independent exercise;Long Term: Able to check pulse independently and accurately       Understanding of Exercise  Prescription Yes       Intervention Provide education, explanation, and written materials on patient's individual exercise prescription       Expected Outcomes Short Term: Able to explain program exercise prescription;Long Term: Able to explain home exercise prescription to exercise independently          Exercise Goals Re-Evaluation :  Exercise Goals Re-Evaluation     Row Name 04/07/24 9277 04/21/24 1617           Exercise Goal Re-Evaluation   Exercise Goals Review Increase Physical Activity;Able to understand and use rate of perceived exertion (RPE) scale;Knowledge and understanding of Target Heart Rate Range (THRR);Understanding of Exercise Prescription;Increase Strength and Stamina;Able to understand and use Dyspnea scale;Able to check pulse independently Increase Physical Activity;Understanding of Exercise Prescription;Increase Strength and Stamina      Comments Reviewed RPE and dyspnea scale, THR and program prescription with pt today.  Pt voiced understanding and was given a copy of goals to take home. Anayia is off to a good start in the program and she completed her first 2 weeks of exercise sessions in this review. She was able to walk 10 laps on the hallway track, work at level 4 on the recumbent bike, and work at level 5 on  the T4 nustep. We will continue to monitor her progress in the program.      Expected Outcomes Short: Use RPE daily to regulate intensity. Long: Follow program prescription in THR. Short: Continue to follow current exercise prescription. Long: Continue exercise to improve strength and stamina.         Discharge Exercise Prescription (Final Exercise Prescription Changes):  Exercise Prescription Changes - 04/21/24 1600       Response to Exercise   Blood Pressure (Admit) 132/64    Blood Pressure (Exercise) 160/80    Blood Pressure (Exit) 132/76    Heart Rate (Admit) 91 bpm    Heart Rate (Exercise) 131 bpm    Heart Rate (Exit) 79 bpm    Rating of Perceived Exertion (Exercise) 15    Comments 1st 2 weeks of exercise sessions    Duration Progress to 30 minutes of  aerobic without signs/symptoms of physical distress    Intensity THRR unchanged      Progression   Progression Continue to progress workloads to maintain intensity without signs/symptoms of physical distress.    Average METs 2.69      Resistance Training   Training Prescription Yes    Weight 3 lb    Reps 10-15      Interval Training   Interval Training No      Recumbant Bike   Level 4    Watts 12    Minutes 15    METs 3.5      NuStep   Level 5    Minutes 15    METs 2.8      Track   Laps 10   hallway   Minutes 15    METs 1.54      Oxygen   Maintain Oxygen Saturation 88% or higher          Nutrition:  Target Goals: Understanding of nutrition guidelines, daily intake of sodium 1500mg , cholesterol 200mg , calories 30% from fat and 7% or less from saturated fats, daily to have 5 or more servings of fruits and vegetables.  Education: Nutrition 1 -Group instruction provided by verbal, written material, interactive activities, discussions, models, and posters to present general guidelines for heart healthy nutrition including macronutrients, label  reading, and promoting whole foods over processed counterparts.  Education serves as Pensions consultant of discussion of heart healthy eating for all. Written material provided at class time. Flowsheet Row Cardiac Rehab from 04/28/2024 in [facility] Cardiac and Pulmonary Rehab  Date 04/28/24  Educator jg  Instruction Review Code 1- Verbalizes Understanding     Education: Nutrition 2 -Group instruction provided by verbal, written material, interactive activities, discussions, models, and posters to present general guidelines for heart healthy nutrition including sodium, cholesterol, and saturated fat. Providing guidance of habit forming to improve blood pressure, cholesterol, and body weight. Written material provided at class time.     Biometrics:  Pre Biometrics - 04/05/24 1106       Pre Biometrics   Height 5' 4.5 (1.638 m)    Weight 203 lb 9.6 oz (92.4 kg)    Waist Circumference 43 inches    Hip Circumference 47 inches    Waist to Hip Ratio 0.91 %    BMI (Calculated) 34.42    Single Leg Stand 7.3 seconds           Nutrition Therapy Plan and Nutrition Goals:  Nutrition Therapy & Goals - 04/05/24 1308       Nutrition Therapy   Diet Cardiac, Low Na    Protein (specify units) 80    Fiber 25 grams    Whole Grain Foods 3 servings    Saturated Fats 12 max. grams    Fruits and Vegetables 5 servings/day    Sodium 1.5 grams      Personal Nutrition Goals   Nutrition Goal Eat 3 times per day, small frequent meals or nutrient dense snacks    Personal Goal #2 Eat 15-30gProtein and 30-60gCarbs at each meal.    Personal Goal #3 Read labels and reduce sodium intake to below 2300mg . Ideally 1500mg  per day.    Comments Patient on zepbound  and says she has been seeing a RD at a weight loss program at [facility]. She eats small meals and says her appetite is reduced. Reminded her of the importance of eating smaller more frequent meals and snacks with nutrient dense foods. Focus on protein, with goal or ~80g per day. Encouraged her to include more healthy fats if her  appetite prevents her from eating at least 1200kcal. Reviewed mediterranean diet handout. Educated on types of fats, sources, and how to read labels. Encouraged her to read labels and limit saturated fat to less than 12g per day and less than 1500mg  of sodium per day. Brainstormed several small meals and snacks with foods she likes and will eat.      Intervention Plan   Intervention Prescribe, educate and counsel regarding individualized specific dietary modifications aiming towards targeted core components such as weight, hypertension, lipid management, diabetes, heart failure and other comorbidities.;Nutrition handout(s) given to patient.    Expected Outcomes Short Term Goal: Understand basic principles of dietary content, such as calories, fat, sodium, cholesterol and nutrients.;Short Term Goal: A plan has been developed with personal nutrition goals set during dietitian appointment.;Long Term Goal: Adherence to prescribed nutrition plan.          Nutrition Assessments:  MEDIFICTS Score Key: >=70 Need to make dietary changes  40-70 Heart Healthy Diet <= 40 Therapeutic Level Cholesterol Diet  Flowsheet Row Cardiac Rehab from 04/12/2024 in [facility] Cardiac and Pulmonary Rehab  Picture Your Plate Total Score on Admission 80   Picture Your Plate Scores: <59 Unhealthy dietary pattern with much room for improvement. 41-50 Dietary pattern unlikely to  meet recommendations for good health and room for improvement. 51-60 More healthful dietary pattern, with some room for improvement.  >60 Healthy dietary pattern, although there may be some specific behaviors that could be improved.    Nutrition Goals Re-Evaluation:   Nutrition Goals Discharge (Final Nutrition Goals Re-Evaluation):   Psychosocial: Target Goals: Acknowledge presence or absence of significant depression and/or stress, maximize coping skills, provide positive support system. Participant is able to verbalize types and ability to use  techniques and skills needed for reducing stress and depression.   Education: Stress, Anxiety, and Depression - Group verbal and visual presentation to define topics covered.  Reviews how body is impacted by stress, anxiety, and depression.  Also discusses healthy ways to reduce stress and to treat/manage anxiety and depression. Written material provided at class time.   Education: Sleep Hygiene -Provides group verbal and written instruction about how sleep can affect your health.  Define sleep hygiene, discuss sleep cycles and impact of sleep habits. Review good sleep hygiene tips.   Initial Review & Psychosocial Screening:  Initial Psych Review & Screening - 03/29/24 1402       Initial Review   Current issues with None Identified      Family Dynamics   Good Support System? Yes    Comments Patient stated that she has a good support system of family and friends. She stated she does not have concerns with depression or stress and is out of work temporarily until she is fully recovered from heart event. 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 She stated she has lost about 30 pounds. Patient is interested in completing cardiac rehab to ensure positive health outcomes.      Barriers   Psychosocial barriers to participate in program There are no identifiable barriers or psychosocial needs.      Screening Interventions   Interventions Encouraged to exercise;Provide feedback about the scores to participant    Expected Outcomes Short Term goal: Identification and review with participant of any Quality of Life or Depression concerns found by scoring the questionnaire.;Long Term goal: The participant improves quality of Life and PHQ9 Scores as seen by post scores and/or verbalization of changes          Quality of Life Scores:   Quality of Life - 04/12/24 0809       Quality of Life   Select Quality of Life      Quality  of Life Scores   Health/Function Pre 24.32 %    Socioeconomic Pre 23.21 %    Psych/Spiritual Pre 23.79 %    Family Pre 23.63 %    GLOBAL Pre 23.88 %         Scores of 19 and below usually indicate a poorer quality of life in these areas.  A difference of  2-3 points is a clinically meaningful difference.  A difference of 2-3 points in the total score of the Quality of Life Index has been associated with significant improvement in overall quality of life, self-image, physical symptoms, and general health in studies assessing change in quality of life.  PHQ-9: Review Flowsheet       04/05/2024 08/16/2022 08/08/2021  Depression screen PHQ 2/9  Decreased Interest 0 0 0  Down, Depressed, Hopeless 0 0 0  PHQ - 2 Score 0 0 0  Altered sleeping 0 - -  Tired, decreased energy 0 - -  Change in appetite 0 - -  Feeling bad or failure about yourself  0 - -  Trouble concentrating 0 - -  Moving slowly or fidgety/restless 0 - -  Suicidal thoughts 0 - -  PHQ-9 Score 0 - -   Interpretation of Total Score  Total Score Depression Severity:  1-4 = Minimal depression, 5-9 = Mild depression, 10-14 = Moderate depression, 15-19 = Moderately severe depression, 20-27 = Severe depression   Psychosocial Evaluation and Intervention:  Psychosocial Evaluation - 03/29/24 1405       Psychosocial Evaluation & Interventions   Interventions Relaxation education;Encouraged to exercise with the program and follow exercise prescription;Stress management education    Comments Patient stated that she has a good support system of family and friends. She stated she does not have concerns with depression or stress and is out of work temporarily until she is fully recovered from heart event. 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 She stated she has lost about 30 pounds. Patient is interested in completing cardiac rehab to ensure positive health outcomes.     Expected Outcomes ST: Attend cardiac rehab for education and exercise. LT: Develop and maintain positive self-care habits.    Continue Psychosocial Services  Follow up required by staff          Psychosocial Re-Evaluation:   Psychosocial Discharge (Final Psychosocial Re-Evaluation):   Vocational Rehabilitation: Provide vocational rehab assistance to qualifying candidates.   Vocational Rehab Evaluation & Intervention:  Vocational Rehab - 03/29/24 1401       Initial Vocational Rehab Evaluation & Intervention   Assessment shows need for Vocational Rehabilitation No          Education: Education Goals: Education classes will be provided on a variety of topics geared toward better understanding of heart health and risk factor modification. Participant will state understanding/return demonstration of topics presented as noted by education test scores.  Learning Barriers/Preferences:  Learning Barriers/Preferences - 03/29/24 1401       Learning Barriers/Preferences   Learning Barriers None    Learning Preferences Computer/Internet;Skilled Demonstration;Pictoral;Video;Written Material          General Cardiac Education Topics:  AED/CPR: - Group verbal and written instruction with the use of models to demonstrate the basic use of the AED with the basic ABC's of resuscitation.   Test and Procedures: - Group verbal and visual presentation and models provide information about basic cardiac anatomy and function. Reviews the testing methods done to diagnose heart disease and the outcomes of the test results. Describes the treatment choices: Medical Management, Angioplasty, or Coronary Bypass Surgery for treating various heart conditions including Myocardial Infarction, Angina, Valve Disease, and Cardiac Arrhythmias. Written material provided at class time.   Medication Safety: - Group verbal and visual instruction to review commonly prescribed medications for heart and lung  disease. Reviews the medication, class of the drug, and side effects. Includes the steps to properly store meds and maintain the prescription regimen. Written material provided at class time.   Intimacy: - Group verbal instruction through game format to discuss how heart and lung disease can affect sexual intimacy. Written material provided at class time. Flowsheet Row Cardiac Rehab from 04/28/2024 in [facility] Cardiac and Pulmonary Rehab  Date 04/21/24  Educator nt  Instruction Review Code 1- TEFL teacher Understanding    Know Your Numbers and Heart Failure: - Group verbal and visual instruction to discuss disease risk factors for cardiac and pulmonary disease and treatment options.  Reviews  associated critical values for Overweight/Obesity, Hypertension, Cholesterol, and Diabetes.  Discusses basics of heart failure: signs/symptoms and treatments.  Introduces Heart Failure Zone chart for action plan for heart failure. Written material provided at class time.   Infection Prevention: - Provides verbal and written material to individual with discussion of infection control including proper hand washing and proper equipment cleaning during exercise session. Flowsheet Row Cardiac Rehab from 04/28/2024 in [facility] Cardiac and Pulmonary Rehab  Date 04/05/24  Educator NT  Instruction Review Code 1- Verbalizes Understanding    Falls Prevention: - Provides verbal and written material to individual with discussion of falls prevention and safety. Flowsheet Row Cardiac Rehab from 04/28/2024 in [facility] Cardiac and Pulmonary Rehab  Date 03/29/24  Educator kb  Instruction Review Code 1- Verbalizes Understanding    Other: -Provides group and verbal instruction on various topics (see comments)   Knowledge Questionnaire Score:  Knowledge Questionnaire Score - 04/12/24 0806       Knowledge Questionnaire Score   Pre Score 26/26          Core Components/Risk Factors/Patient Goals at Admission:  Personal  Goals and Risk Factors at Admission - 03/29/24 1401       Core Components/Risk Factors/Patient Goals on Admission    Weight Management Weight Loss    Hypertension Yes    Intervention Provide education on lifestyle modifcations including regular physical activity/exercise, weight management, moderate sodium restriction and increased consumption of fresh fruit, vegetables, and low fat dairy, alcohol moderation, and smoking cessation.    Expected Outcomes Long Term: Maintenance of blood pressure at goal levels.;Short Term: Continued assessment and intervention until BP is < 140/53mm HG in hypertensive participants. < 130/52mm HG in hypertensive participants with diabetes, heart failure or chronic kidney disease.    Lipids Yes    Intervention Provide education and support for participant on nutrition & aerobic/resistive exercise along with prescribed medications to achieve LDL 70mg , HDL >40mg .    Expected Outcomes Short Term: Participant states understanding of desired cholesterol values and is compliant with medications prescribed. Participant is following exercise prescription and nutrition guidelines.;Long Term: Cholesterol controlled with medications as prescribed, with individualized exercise RX and with personalized nutrition plan. Value goals: LDL < 70mg , HDL > 40 mg.          Education:Diabetes - Individual verbal and written instruction to review signs/symptoms of diabetes, desired ranges of glucose level fasting, after meals and with exercise. Acknowledge that pre and post exercise glucose checks will be done for 3 sessions at entry of program.   Core Components/Risk Factors/Patient Goals Review:    Core Components/Risk Factors/Patient Goals at Discharge (Final Review):    ITP Comments:  ITP Comments     Row Name 03/29/24 1407 04/05/24 1022 04/07/24 0722 04/28/24 0810     ITP Comments Initial phone call completed. Diagnosis can be found in [facility] 02/19/2024. EP Orientation scheduled  for Monday, April 05, 2024 @ 0900. Completed and gym orientation for cardiac rehab. Initial ITP created and sent for review to Dr. Oneil Pinal, Medical Director. First full day of exercise!  Patient was oriented to gym and equipment including functions, settings, policies, and procedures.  Patient's individual exercise prescription and treatment plan were reviewed.  All starting workloads were established based on the results of the 6 minute walk test done at initial orientation visit.  The plan for exercise progression was also introduced and progression will be customized based on patient's performance and goals. 30 Day review completed. Medical Director ITP  review done, changes made as directed, and signed approval by Medical Director. New to program.       Comments: 30 day review

## 2024-04-28 NOTE — Progress Notes (Signed)
 Daily Session Note  Patient Details  Name: Debbie [name] MRN: [id number] Date of Birth: [birth year] Referring Provider:   Flowsheet Row Cardiac Rehab from 04/05/2024 in [facility] Cardiac and Pulmonary Rehab  Referring Provider Dr. Redell Cave, MD    Encounter Date: 04/28/2024  Check In:  Session Check In - 04/28/24 0740       Check-In   Supervising physician immediately available to respond to emergencies See telemetry face sheet for immediately available ER MD    Location [facility]-Cardiac & Pulmonary Rehab    Staff Present Burnard Davenport RN,BSN,MPA;Joseph Hood RCP,RRT,BSRT;Noah Tickle, [place], Exercise Physiologist;Jason Elnor RDN,LDN    Virtual Visit No    Medication changes reported     No    Fall or balance concerns reported    No    Tobacco Cessation No Change    Warm-up and Cool-down Performed on first and last piece of equipment    Resistance Training Performed Yes    VAD Patient? No    PAD/SET Patient? No      Pain Assessment   Currently in Pain? No/denies             Social History   Tobacco Use  Smoking Status Never  Smokeless Tobacco Never    Goals Met:  Independence with exercise equipment Exercise tolerated well No report of concerns or symptoms today Strength training completed today  Goals Unmet:  Not Applicable  Comments: Pt able to follow exercise prescription today without complaint.  Will continue to monitor for progression.    Dr. Oneil Pinal is Medical Director for [facility] Cardiac Rehabilitation.  Dr. Fuad Aleskerov is Medical Director for [facility] Pulmonary Rehabilitation.

## 2024-04-30 ENCOUNTER — Encounter: Admitting: Emergency Medicine

## 2024-04-30 DIAGNOSIS — Z955 Presence of coronary angioplasty implant and graft: Secondary | ICD-10-CM

## 2024-04-30 DIAGNOSIS — I213 ST elevation (STEMI) myocardial infarction of unspecified site: Secondary | ICD-10-CM | POA: Diagnosis not present

## 2024-04-30 NOTE — Progress Notes (Signed)
 Daily Session Note  Patient Details  Name: Debbie [name] MRN: [id number] Date of Birth: Oct 16, 1955 Referring Provider:   Flowsheet Row Cardiac Rehab from 04/05/2024 in [facility] Cardiac and Pulmonary Rehab  Referring Provider Dr. Redell Cave, MD    Encounter Date: 04/30/2024  Check In:  Session Check In - 04/30/24 0729       Check-In   Supervising physician immediately available to respond to emergencies See telemetry face sheet for immediately available ER MD    Location [facility]-Cardiac & Pulmonary Rehab    Staff Present Fairy Plater RCP,RRT,BSRT;Joon Pohle RN,BSN;Noah Tickle, BS, Exercise Physiologist    Virtual Visit No    Medication changes reported     No    Fall or balance concerns reported    No    Tobacco Cessation No Change    Warm-up and Cool-down Performed on first and last piece of equipment    Resistance Training Performed Yes    VAD Patient? No    PAD/SET Patient? No      Pain Assessment   Currently in Pain? No/denies             Social History   Tobacco Use  Smoking Status Never  Smokeless Tobacco Never    Goals Met:  Independence with exercise equipment Exercise tolerated well No report of concerns or symptoms today Strength training completed today  Goals Unmet:  Not Applicable  Comments: Pt able to follow exercise prescription today without complaint.  Will continue to monitor for progression.    Dr. Oneil Pinal is Medical Director for [facility] Cardiac Rehabilitation.  Dr. Fuad Aleskerov is Medical Director for [facility] Pulmonary Rehabilitation.

## 2024-05-03 ENCOUNTER — Encounter

## 2024-05-03 DIAGNOSIS — I213 ST elevation (STEMI) myocardial infarction of unspecified site: Secondary | ICD-10-CM

## 2024-05-03 DIAGNOSIS — Z955 Presence of coronary angioplasty implant and graft: Secondary | ICD-10-CM

## 2024-05-03 NOTE — Progress Notes (Signed)
 Daily Session Note  Patient Details  Name: Debbie [name] MRN: [id number] Date of Birth: 1956-02-27 Referring Provider:   Flowsheet Row Cardiac Rehab from 04/05/2024 in [facility] Cardiac and Pulmonary Rehab  Referring Provider Dr. Redell Cave, MD    Encounter Date: 05/03/2024  Check In:  Session Check In - 05/03/24 0812       Check-In   Supervising physician immediately available to respond to emergencies See telemetry face sheet for immediately available ER MD    Location [facility]-Cardiac & Pulmonary Rehab    Staff Present Burnard Davenport RN,BSN,MPA;Joseph [facility] Dyane BS, ACSM CEP, Exercise Physiologist;Jason Elnor RDN,LDN    Virtual Visit No    Medication changes reported     No    Fall or balance concerns reported    No    Tobacco Cessation No Change    Warm-up and Cool-down Performed on first and last piece of equipment    Resistance Training Performed Yes    VAD Patient? No    PAD/SET Patient? No      Pain Assessment   Currently in Pain? No/denies             Social History   Tobacco Use  Smoking Status Never  Smokeless Tobacco Never    Goals Met:  Independence with exercise equipment Exercise tolerated well No report of concerns or symptoms today Strength training completed today  Goals Unmet:  Not Applicable  Comments: Pt able to follow exercise prescription today without complaint.  Will continue to monitor for progression.    Dr. Oneil Pinal is Medical Director for [facility] Cardiac Rehabilitation.  Dr. Fuad Aleskerov is Medical Director for [facility] Pulmonary Rehabilitation.

## 2024-05-04 ENCOUNTER — Encounter: Payer: Self-pay | Admitting: Family Medicine

## 2024-05-04 ENCOUNTER — Other Ambulatory Visit: Payer: Self-pay

## 2024-05-04 ENCOUNTER — Ambulatory Visit (INDEPENDENT_AMBULATORY_CARE_PROVIDER_SITE_OTHER): Admitting: Family Medicine

## 2024-05-04 VITALS — BP 144/86 | HR 80 | Ht 64.5 in | Wt 189.0 lb

## 2024-05-04 DIAGNOSIS — M25552 Pain in left hip: Secondary | ICD-10-CM | POA: Diagnosis not present

## 2024-05-04 DIAGNOSIS — M5416 Radiculopathy, lumbar region: Secondary | ICD-10-CM

## 2024-05-04 NOTE — Patient Instructions (Addendum)
 Thank you for coming in today.   I've referred you to Physical Therapy here, at this office.  You will hear from our office soon about scheduling, once we check with your insurance company.   Check back in 2 months

## 2024-05-04 NOTE — Progress Notes (Signed)
 I, Leotis Batter, CMA acting as a scribe for Artist Lloyd, MD.  Debbie [name] is a 68 y.o. female who presents to [facility] today for exacerbation of her L hip and low back pain. Pt was last seen by Dr. Lloyd on 01/30/24 and was advised to cont PT and use hydrocodone  and gabapentin  prn.  Today, pt reports canceling appt 03/2024 due to MI/heart attack. Having some hip and low back pain. Concerned about pain radiating into the anterior thigh. Has started ambulating with a walking stick. Currently in cardiac rehab and physical therapy at [facility]. Has been working with a systems analyst or core strength, unsure if this may be exacerbating pinched nerve in lower back. Does not want to discuss surgery or steroids today.   Dx imaging: 01/03/24 L-spine MRI 12/23/23 L hip XR 12/27/23 L hip MRI              12/23/23 L-spine XR  Pertinent review of systems: No fevers or chills  Relevant historical information: Sudden cardiac collapse with MI.  Patient had stenting and is on Brilinta  and aspirin    Exam:  BP (!) 144/86   Pulse 80   Ht 5' 4.5 (1.638 m)   Wt 189 lb (85.7 kg)   LMP 07/09/2007 (Approximate)   SpO2 98%   BMI 31.94 kg/m  General: Well Developed, well nourished, and in no acute distress.   MSK: L-spine nontender to palpation midline decreased lumbar motion lower extremity strength reduced left hip flexion mildly.    Lab and Radiology Results   Lumbar spine MRI: January 03, 2024 IMPRESSION: Multilevel degenerative spondylosis with levels described in detail above.   L3-4 has a left foraminal protrusion with suspected impingement of the exiting L3 nerve. Also a moderate degenerative central stenosis.   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   Left hip MRI December 27, 2023  IMPRESSION: Unremarkable exam.    Assessment and Plan: 68 y.o. female with left leg pain.  This issue was effectively discussed during the visit on  June 25.  Unfortunately 3 days later she had cardiac collapse and an MI at almost died and has no recollection of the office visit or any plan or discussion.  Her pain is due to left L3 lumbar radiculopathy based on her recent MRIs.  She already has had trials of physical therapy which unfortunately were not very effective.  She is not a good candidate for lumbar spine surgery or epidural steroid injection as she is on Brilinta  blood thinner which she will need to be on until July or August 2026 due to a drug-eluting stent.  At this point her option is more physical therapy.  Plan to refer to PT and coordinate care with physical therapy here locally. We talked about gabapentin .  However her pain is not bothersome at night it is really only bothersome during the day.  Could use this if needed but not optimistic about it.  Recheck in 2 months.   PDMP not reviewed this encounter. Orders Placed This Encounter  Procedures   US  LIMITED JOINT SPACE STRUCTURES LOW LEFT(NO LINKED CHARGES)    Reason for Exam (SYMPTOM  OR DIAGNOSIS REQUIRED):   left hip pain    Preferred imaging location?:   [facility] referral to Physical Therapy    Referral Priority:   Routine    Referral Type:   Physical Medicine    Referral Reason:   Specialty  Services Required    Requested Specialty:   Physical Therapy    Number of Visits Requested:   1   No orders of the defined types were placed in this encounter.    Discussed warning signs or symptoms. Please see discharge instructions. Patient expresses understanding.   The above documentation has been reviewed and is accurate and complete Artist Lloyd, M.D. Total encounter time 30 minutes including face-to-face time with the patient and, reviewing past medical record, and charting on the date of service.

## 2024-05-05 ENCOUNTER — Encounter

## 2024-05-05 DIAGNOSIS — I213 ST elevation (STEMI) myocardial infarction of unspecified site: Secondary | ICD-10-CM | POA: Diagnosis not present

## 2024-05-05 DIAGNOSIS — Z955 Presence of coronary angioplasty implant and graft: Secondary | ICD-10-CM

## 2024-05-05 NOTE — Progress Notes (Signed)
 Daily Session Note  Patient Details  Name: Debbie [name] MRN: [id number] Date of Birth: 1955/11/15 Referring Provider:   Flowsheet Row Cardiac Rehab from 04/05/2024 in [facility] Cardiac and Pulmonary Rehab  Referring Provider Dr. Redell Cave, MD    Encounter Date: 05/05/2024  Check In:  Session Check In - 05/05/24 0800       Check-In   Supervising physician immediately available to respond to emergencies See telemetry face sheet for immediately available ER MD    Location [facility]-Cardiac & Pulmonary Rehab    Staff Present Burnard Davenport RN,BSN,MPA;Joseph Rolinda RCP,RRT,BSRT;Margaret Best, MS, Exercise Physiologist;Noah Tickle, BS, Exercise Physiologist    Virtual Visit No    Medication changes reported     No    Fall or balance concerns reported    No    Tobacco Cessation No Change    Warm-up and Cool-down Performed on first and last piece of equipment    Resistance Training Performed Yes    VAD Patient? No    PAD/SET Patient? No      Pain Assessment   Currently in Pain? No/denies             Social History   Tobacco Use  Smoking Status Never  Smokeless Tobacco Never    Goals Met:  Independence with exercise equipment Exercise tolerated well No report of concerns or symptoms today Strength training completed today  Goals Unmet:  Not Applicable  Comments: Pt able to follow exercise prescription today without complaint.  Will continue to monitor for progression.    Dr. Oneil Pinal is Medical Director for [facility] Cardiac Rehabilitation.  Dr. Fuad Aleskerov is Medical Director for [facility] Pulmonary Rehabilitation.

## 2024-05-07 ENCOUNTER — Encounter: Admitting: Emergency Medicine

## 2024-05-07 DIAGNOSIS — Z955 Presence of coronary angioplasty implant and graft: Secondary | ICD-10-CM

## 2024-05-07 DIAGNOSIS — I213 ST elevation (STEMI) myocardial infarction of unspecified site: Secondary | ICD-10-CM | POA: Diagnosis not present

## 2024-05-07 NOTE — Progress Notes (Signed)
 Daily Session Note  Patient Details  Name: Debbie [name] MRN: [id number] Date of Birth: [birth year] Referring Provider:   Flowsheet Row Cardiac Rehab from 04/05/2024 in [facility] Cardiac and Pulmonary Rehab  Referring Provider Dr. Redell Cave, MD    Encounter Date: 05/07/2024  Check In:  Session Check In - 05/07/24 0742       Check-In   Supervising physician immediately available to respond to emergencies See telemetry face sheet for immediately available ER MD    Location [facility]-Cardiac & Pulmonary Rehab    Staff Present Devaughn Jaeger, BS, Exercise Physiologist;Joseph Rolinda RCP,RRT,BSRT;Tvisha Schwoerer RN,BSN    Virtual Visit No    Medication changes reported     No    Fall or balance concerns reported    No    Tobacco Cessation No Change    Warm-up and Cool-down Performed on first and last piece of equipment    Resistance Training Performed Yes    VAD Patient? No    PAD/SET Patient? No      Pain Assessment   Currently in Pain? No/denies             Social History   Tobacco Use  Smoking Status Never  Smokeless Tobacco Never    Goals Met:  Independence with exercise equipment Exercise tolerated well No report of concerns or symptoms today Strength training completed today  Goals Unmet:  Not Applicable  Comments: Pt able to follow exercise prescription today without complaint.  Will continue to monitor for progression.    Dr. Oneil Pinal is Medical Director for [facility] Cardiac Rehabilitation.  Dr. Fuad Aleskerov is Medical Director for [facility] Pulmonary Rehabilitation.

## 2024-05-10 ENCOUNTER — Encounter: Attending: Cardiology | Admitting: *Deleted

## 2024-05-10 ENCOUNTER — Ambulatory Visit: Payer: PRIVATE HEALTH INSURANCE | Admitting: Clinical

## 2024-05-10 DIAGNOSIS — I252 Old myocardial infarction: Secondary | ICD-10-CM | POA: Insufficient documentation

## 2024-05-10 DIAGNOSIS — Z48812 Encounter for surgical aftercare following surgery on the circulatory system: Secondary | ICD-10-CM | POA: Insufficient documentation

## 2024-05-10 DIAGNOSIS — I213 ST elevation (STEMI) myocardial infarction of unspecified site: Secondary | ICD-10-CM

## 2024-05-10 DIAGNOSIS — F4320 Adjustment disorder, unspecified: Secondary | ICD-10-CM

## 2024-05-10 DIAGNOSIS — Z955 Presence of coronary angioplasty implant and graft: Secondary | ICD-10-CM | POA: Insufficient documentation

## 2024-05-10 NOTE — Progress Notes (Signed)
   Debbie Seats, LCSW

## 2024-05-10 NOTE — Progress Notes (Signed)
 [facility] Counselor/Therapist Progress Note  Patient ID: Debbie [name], MRN: [id number],    Date: 05/10/2024  Time Spent: 3:32pm- 4:20pm : 48 minutes   Treatment Type: Individual Therapy  Reported Symptoms: none reported  Mental Status Exam: Appearance:  Neat and Well Groomed     Behavior: Appropriate  Motor: Normal  Speech/Language:  Clear and Coherent and Normal Rate  Affect: Appropriate  Mood: normal  Thought process: normal  Thought content:   WNL  Sensory/Perceptual disturbances:   WNL  Orientation: oriented to person, place, time/date, and situation  Attention: Good  Concentration: Good  Memory: WNL  Fund of knowledge:  Good  Insight:   Good  Judgment:  Good  Impulse Control: Good   Risk Assessment: Danger to Self:  No Patient denied current suicidal ideation  Self-injurious Behavior: No Danger to Others: No Patient denied current homicidal ideation Duty to Warn:no Physical Aggression / Violence:No  Access to Firearms a concern: No  Gang Involvement:No   Subjective: Patient stated, pretty much 90% of my time is spent in cardiac rehab and physical therapy and in the gym trying to strengthening my core and my alignment. Patient stated, my cardiac stuff is going well. Patient  stated, today has been a good day. Patient stated, excellent in response to patient's mood. Patient stated, I feel in a good place. Patient stated, I've come to the realization this is not a few weeks event, that it's something I'm going to have to commit to for the rest of my life in reference to changes in patient's health and lifestyle. Patient reported patient has observed herself spending money on devices to support patient's health, such as, back brace, knee brace. Patient stated, this is a fraction of what it would cost to not take care of my health in reference to recent purchases. Patient stated, it makes me feel better that I'm getting the things that is  enhanced my healing in reference to recent purchases. Patient stated, its kind of like a wake up call to reset myself in reference to recent changes in patient's health. Patient stated, I'm not as dismissive of things in reference to patient's health. Patient reported patient would have to consider fitness resources in both areas and patient's 50 minutes commute in one direction. Patient stated, how do I stay consistent and stay accountable to myself.   Interventions: Cognitive Behavioral Therapy. Clinician conducted session via caregility video from clinician's home office. Patient provided verbal consent to proceed with telehealth session and is aware of limitations of telephone or video visits. Patient participated in session from patient's vehicle. Reviewed events since last session and assessed for changes. Discussed patient's progress as it relates to cardiac rehab and physical therapy. Explored changes in patient's perspective as it relates to recent changes in patient's health. Assisted patient in exploring alternative perspectives as it relates to patient's recent health related purchases. Provided psycho education related to generating alternatives. Discussed pending decision regarding patient commuting to work or staying at the principal financial when patient returns to work.   Collaboration of Care: Other not required at this time   Diagnosis:  Adjustment disorder, unspecified type     Plan: Patient is to utilize Cognitive Behavioral Therapy, effective communication, and coping strategies to decrease symptoms associated with their diagnosis. Frequency: bi-weekly  Modality: individual      Long-term goal:   Increase use of self care strategies from 5 days per week to 7 days per week  Target Date: 10/03/24  Progress: progressing  Practice effective communication strategies for patient to utilize when expressing her thoughts and feelings to others and establishing boundaries in a controlled  and assertive way  Target Date: 10/03/24  Progress: progressing    Short-term goal:  Verbally express an understanding of the relationship between concern/worry related to patient's recent medical symptoms/events and their impact on thinking patterns and behaviors. Target Date: 10/03/24  Progress: progressing                              Darice Seats, LCSW

## 2024-05-10 NOTE — Progress Notes (Signed)
 Daily Session Note  Patient Details  Name: Debbie [name] MRN: [id number] Date of Birth: 05/03/1956 Referring Provider:   Flowsheet Row Cardiac Rehab from 04/05/2024 in [facility] Cardiac and Pulmonary Rehab  Referring Provider Dr. Redell Cave, MD    Encounter Date: 05/10/2024  Check In:  Session Check In - 05/10/24 0744       Check-In   Supervising physician immediately available to respond to emergencies See telemetry face sheet for immediately available ER MD    Location [facility]-Cardiac & Pulmonary Rehab    Staff Present Othel Durand, RN, BSN, CCRP;Jason Elnor RDN,LDN;Joseph [employer] BS, ACSM CEP, Exercise Physiologist    Virtual Visit No    Medication changes reported     No    Fall or balance concerns reported    No    Warm-up and Cool-down Performed on first and last piece of equipment    Resistance Training Performed Yes    VAD Patient? No    PAD/SET Patient? No      Pain Assessment   Currently in Pain? No/denies             Social History   Tobacco Use  Smoking Status Never  Smokeless Tobacco Never    Goals Met:  Independence with exercise equipment Exercise tolerated well No report of concerns or symptoms today  Goals Unmet:  Not Applicable  Comments: Pt able to follow exercise prescription today without complaint.  Will continue to monitor for progression.    Dr. Oneil Pinal is Medical Director for [facility] Cardiac Rehabilitation.  Dr. Fuad Aleskerov is Medical Director for [facility] Pulmonary Rehabilitation.

## 2024-05-11 ENCOUNTER — Encounter: Payer: Self-pay | Admitting: Physical Therapy

## 2024-05-11 ENCOUNTER — Ambulatory Visit: Admitting: Physical Therapy

## 2024-05-11 ENCOUNTER — Other Ambulatory Visit: Payer: Self-pay

## 2024-05-11 DIAGNOSIS — M5459 Other low back pain: Secondary | ICD-10-CM | POA: Diagnosis not present

## 2024-05-11 DIAGNOSIS — M79652 Pain in left thigh: Secondary | ICD-10-CM | POA: Diagnosis not present

## 2024-05-11 DIAGNOSIS — M25552 Pain in left hip: Secondary | ICD-10-CM

## 2024-05-11 DIAGNOSIS — G8929 Other chronic pain: Secondary | ICD-10-CM

## 2024-05-11 DIAGNOSIS — M25562 Pain in left knee: Secondary | ICD-10-CM | POA: Diagnosis not present

## 2024-05-11 DIAGNOSIS — M6281 Muscle weakness (generalized): Secondary | ICD-10-CM

## 2024-05-11 DIAGNOSIS — R2689 Other abnormalities of gait and mobility: Secondary | ICD-10-CM

## 2024-05-11 NOTE — Therapy (Signed)
 OUTPATIENT PHYSICAL THERAPY EVALUATION   Patient Name: Debbie [name] MRN: [id number] DOB:Dec 04, 1955, 68 y.o., female Today's Date: 05/11/2024   END OF SESSION:  PT End of Session - 05/11/24 1345     Visit Number 1    Number of Visits 16    Date for Recertification  07/06/24    Authorization Type BCBS    PT Start Time 1305    PT Stop Time 1430    PT Time Calculation (min) 85 min    Activity Tolerance Patient tolerated treatment well    Behavior During Therapy WFL for tasks assessed/performed          Past Medical History:  Diagnosis Date   Genital warts    Hyperlipidemia    Hypertension    Myocardial infarction (HCC)    Peripheral arterial disease    Status post angioplasty with stent    Known femoral stent occlusion   Past Surgical History:  Procedure Laterality Date   ABDOMINAL AORTAGRAM N/A 08/09/2011   Procedure: ABDOMINAL EZELLA;  Surgeon: Carlin FORBES Haddock, MD;  Location: [facility] CATH LAB;  Service: Cardiovascular;  Laterality: N/A;   ANGIOPLASTY / STENTING FEMORAL  2008   Right femoral artery stenting done in [place]    COLONOSCOPY WITH PROPOFOL  N/A 06/11/2021   Procedure: COLONOSCOPY WITH PROPOFOL ;  Surgeon: Therisa Bi, MD;  Location: [facility] ENDOSCOPY;  Service: Gastroenterology;  Laterality: N/A;  2ND ARRIVAL, PLEASE   FEMORAL BYPASS Right 08/2011   Dr. Sherre at [facility]   Patient Active Problem List   Diagnosis Date Noted   Syncope and collapse 02/19/2024   Lumbar radiculopathy 01/07/2024   Colon cancer (HCC) 08/14/2021   Hypertension 11/28/2017   Vitamin D  deficiency 05/13/2012   Hypomagnesemia 11/11/2011   Hyperlipidemia 09/06/2011   Prediabetes 09/06/2011   PAD (peripheral artery disease) 08/01/2011   Pain in limb 08/01/2011   S/P angioplasty with stent 08/01/2011    PCP: Eliverto Bette Hover, MD  REFERRING PROVIDER: Joane Artist RAMAN, MD  REFERRING DIAG: Lumbar radiculopathy  Rationale for Evaluation and Treatment: Rehabilitation  THERAPY DIAG:  Other  low back pain  Pain in left hip  Pain in left thigh  Chronic pain of left knee  Muscle weakness (generalized)  Other abnormalities of gait and mobility  ONSET DATE: Chronic   SUBJECTIVE:          SUBJECTIVE STATEMENT: Patient reports chronic left hip and thigh pain, then in July she had a heart attack. She was discharged in August, and then when she returned home her left hip and knee were bothering her still. She does get some pain in her lower back with lifting heavy objects. The pain can radiate down the left thigh to the knee. The pain in the hip will bother her if she sleeps on her left side. She has been seeing a PT previously working on the left hip. She does use a walking stick occasionally if the left leg flares up. She does feel like when she walks she compensates so will use the walking stick. She does feel like she has made progress regarding the left hip because she has been walking with cardiac rehab and this has gotten much better. She continues to be limited primarily by the pain in the left thigh with walking and standing, and feels like she has to take pressure off the left leg. She does do personal training 3x/week that focuses on her core, gait, and posture.  She is participating in cardiac rehab 3x/week and was seeing  PT 2-3x/week.  PERTINENT HISTORY:  MI in June 2025  PAIN:  Are you having pain? Yes:  NPRS scale: 0/10 currently, 4-5/10 when pain occurs Pain location: Left thigh to the knee Pain description: Pressure, dull Aggravating factors: Walking, standing Relieving factors: Rest, stretching, exercise, using walking stick  PRECAUTIONS: Patient is currently participating in cardiac rehab for recent MI  RED FLAGS: None   WEIGHT BEARING RESTRICTIONS: No  FALLS:  Has patient fallen in last 6 months? No  PLOF: Independent  PATIENT GOALS: Pain relief, improving walking and standing tolerance   OBJECTIVE:  Note: Objective measures were completed at  Evaluation unless otherwise noted. PATIENT SURVEYS:  PSFS: 5 Walking 22 laps on track / begin walkingon treadmill without pain in left thigh/knee: 4 Standing for long periods due to left thigh pain: 5 Heavy lifting due to lower back: 6  COGNITION: Overall cognitive status: Within functional limits for tasks assessed     SENSATION: WFL  MUSCLE LENGTH: Limitations with bilateral hamstring and hip flexor/quad  POSTURE:   Rounded shoulder posture  PALPATION: Tender to palpation lumbar with hypomobility noted with lumbar CPAs  LUMBAR ROM:   AROM eval  Flexion WFL  Extension 50%  Right lateral flexion   Left lateral flexion   Right rotation 75%  Left rotation 75%   (Blank rows = not tested)  LOWER EXTREMITY ROM:      Hip PROM grossly WFL and non-painful  LOWER EXTREMITY MMT:    MMT Right eval Left eval  Hip flexion 4- 4-  Hip extension 4- 4-  Hip abduction 3+ 3  Hip adduction    Hip internal rotation    Hip external rotation    Knee flexion 5 5  Knee extension 5 5  Ankle dorsiflexion    Ankle plantarflexion    Ankle inversion    Ankle eversion     (Blank rows = not tested)  FUNCTIONAL TESTS:  30 sec stand test: 11 reps  GAIT: Assistive device utilized: None Level of assistance: Complete Independence Comments: Trendelenburg on left   TREATMENT  [facility] Adult PT Treatment:                                                DATE: 05/11/2024 Reviewed / demonstrated exercises consisting of supine pelvic tilts, bridge, dead bug, bird dog, 1/2 kneeling hip flexion, piriformis stretch Quadruped pelvic tilts x 10  Spent extensive time discussing patient's cardiac rehab routine in great detail consisting of machines and walking program, exercise routine with personal trainer focused on core/posture/gait and various exercises, prior physical therapy exercises/treatment/frequency and her response to those treatments. Discussed exercises she is performing daily at home.  Spent time discussing likely need for rest day with all the current exercise she is completing on a daily basis. Spent time discussing lumbar radiculopathy vs GTPS vs specific knee pain and patient also expressed concern regarding getting a knee x-ray and possibility of participating in study through [facility] whre she would need a knee x-ray and MRI. Discussed patient's goals of improving her walking and standing tolerance and also physical therapy goal of improving her lower back mobility, core and hip strength, to improve her activity tolerance and reduce pain.   PATIENT EDUCATION:  Education details: Exam findings, POC, HEP Person educated: Patient Education method: Explanation, Demonstration, Tactile cues, Verbal cues, and Handouts Education comprehension:  verbalized understanding, returned demonstration, verbal cues required, tactile cues required, and needs further education  HOME EXERCISE PROGRAM: Access Code: [email or web address]   ASSESSMENT: CLINICAL IMPRESSION: Patient is a 68 y.o. female who was seen today for physical therapy evaluation and treatment for chronic left thigh and knee pain that do seem consistent with left lumbar radicular pain, but she also reports pain consistent with left GTPS and left knee pain. She does exhibit mobility deficit of the lumbar spine and gross strength deficit of the core and hip region. Extensive time spent discussing her current exercise and rehab routine as noted above. Patient demonstrated current core and hip exercises and provided patient quadruped pelvic tilt exercises to work of core neuromuscular control. She did report some lower back discomfort with supine core exercises likely due to weakness and mobility deficit, and reports left hip and thigh pain with hip abductor strengthening exercises. Patient would benefit from continued skilled PT to progress mobility and strength in order to reduce pain and maximize functional ability.  OBJECTIVE IMPAIRMENTS: Abnormal  gait, decreased activity tolerance, decreased balance, decreased ROM, decreased strength, hypomobility, impaired flexibility, postural dysfunction, and pain.   ACTIVITY LIMITATIONS: lifting, standing, sleeping, and locomotion level  PARTICIPATION LIMITATIONS: meal prep, cleaning, shopping, and community activity  PERSONAL FACTORS: Fitness, Past/current experiences, and Time since onset of injury/illness/exacerbation are also affecting patient's functional outcome.   REHAB POTENTIAL: Good  CLINICAL DECISION MAKING: Stable/uncomplicated  EVALUATION COMPLEXITY: Low   GOALS: Goals reviewed with patient? Yes  SHORT TERM GOALS: Target date: 06/08/2024  Patient will be I with initial HEP in order to progress with therapy. Baseline: HEP provided at eval Goal status: INITIAL  2.  Patient will report left thigh, hip, knee pain </= 2/10 in order to reduce functional limitations and improve walking tolerance Baseline: 4-5/10 Goal status: INITIAL  LONG TERM GOALS: Target date: 07/06/2024  Patient will be I with final HEP to maintain progress from PT. Baseline: HEP provided at eval Goal status: INITIAL  2.  Patient will report PSFS >/= 8 in order to indicate improvement in their functional ability. Baseline: 5 Goal status: INITIAL  3.  Patient will demonstrate gross hip strength >/= 4/5 MMT to improve her walking and standing tolerance Baseline: see limitations above Goal status: INITIAL  4.  Patient will perform 30 sec stand test >/= 15 reps to indicate improved strength and endurance for better walking and standing tolerance Baseline: 11 reps Goal status: INITIAL   PLAN: PT FREQUENCY: 1-2x/week  PT DURATION: 8 weeks  PLANNED INTERVENTIONS: 97164- PT Re-evaluation, 97750- Physical Performance Testing, 97110-Therapeutic exercises, 97530- Therapeutic activity, 97112- Neuromuscular re-education, 97535- Self Care, [id number]- Manual therapy, [id number]- Gait training, [phone] (1-2 muscles),  20561 (3+ muscles)- Dry Needling, Patient/Family education, Balance training, Joint mobilization, Joint manipulation, Spinal manipulation, Spinal mobilization, Cryotherapy, and Moist heat.  PLAN FOR NEXT SESSION: Review HEP and progress PRN, manual for lumbar mobility, progress lumbar motion and stretching, progress hip and core strengthening/stabilization   Elaine Daring, PT, DPT, LAT, ATC 05/11/24  4:02 PM Phone: [phone] Fax: [phone]

## 2024-05-11 NOTE — Patient Instructions (Signed)
 Access Code: [email or web address] URL: [email or web address] Date: 05/11/2024 Prepared by: Elaine Daring  Exercises - Quadruped Pelvic Tilt  - 1 x daily - 10 reps

## 2024-05-12 ENCOUNTER — Encounter: Admitting: Emergency Medicine

## 2024-05-12 DIAGNOSIS — I213 ST elevation (STEMI) myocardial infarction of unspecified site: Secondary | ICD-10-CM

## 2024-05-12 DIAGNOSIS — Z955 Presence of coronary angioplasty implant and graft: Secondary | ICD-10-CM

## 2024-05-12 NOTE — Progress Notes (Signed)
 Daily Session Note  Patient Details  Name: Debbie [name] MRN: [id number] Date of Birth: 02-19-1956 Referring Provider:   Flowsheet Row Cardiac Rehab from 04/05/2024 in [facility] Cardiac and Pulmonary Rehab  Referring Provider Dr. Redell Cave, MD    Encounter Date: 05/12/2024  Check In:  Session Check In - 05/12/24 0728       Check-In   Supervising physician immediately available to respond to emergencies See telemetry face sheet for immediately available ER MD    Location [facility]-Cardiac & Pulmonary Rehab    Staff Present Rollene Paterson, MS, Exercise Physiologist;Alixandrea Milleson Vita RN,BSN;Joseph [facility] BS, Exercise Physiologist    Virtual Visit No    Medication changes reported     No    Fall or balance concerns reported    No    Tobacco Cessation No Change    Warm-up and Cool-down Performed on first and last piece of equipment    Resistance Training Performed Yes    VAD Patient? No    PAD/SET Patient? No      Pain Assessment   Currently in Pain? No/denies             Social History   Tobacco Use  Smoking Status Never  Smokeless Tobacco Never    Goals Met:  Independence with exercise equipment Exercise tolerated well No report of concerns or symptoms today Strength training completed today  Goals Unmet:  Not Applicable  Comments: Pt able to follow exercise prescription today without complaint.  Will continue to monitor for progression.    Dr. Oneil Pinal is Medical Director for [facility] Cardiac Rehabilitation.  Dr. Fuad Aleskerov is Medical Director for [facility] Pulmonary Rehabilitation.

## 2024-05-14 ENCOUNTER — Encounter: Admitting: Emergency Medicine

## 2024-05-14 DIAGNOSIS — Z955 Presence of coronary angioplasty implant and graft: Secondary | ICD-10-CM | POA: Diagnosis not present

## 2024-05-14 DIAGNOSIS — I213 ST elevation (STEMI) myocardial infarction of unspecified site: Secondary | ICD-10-CM

## 2024-05-14 NOTE — Progress Notes (Signed)
 Daily Session Note  Patient Details  Name: Debbie [name] MRN: [id number] Date of Birth: 06-25-1956 Referring Provider:   Flowsheet Row Cardiac Rehab from 04/05/2024 in [facility] Cardiac and Pulmonary Rehab  Referring Provider Dr. Redell Cave, MD    Encounter Date: 05/14/2024  Check In:  Session Check In - 05/14/24 0725       Check-In   Supervising physician immediately available to respond to emergencies See telemetry face sheet for immediately available ER MD    Location [facility]-Cardiac & Pulmonary Rehab    Staff Present Leita Franks RN,BSN;Joseph [facility] [place], [place], Exercise Physiologist    Virtual Visit No    Medication changes reported     No    Fall or balance concerns reported    No    Tobacco Cessation No Change    Warm-up and Cool-down Performed on first and last piece of equipment    Resistance Training Performed Yes    VAD Patient? No    PAD/SET Patient? No      Pain Assessment   Currently in Pain? No/denies             Social History   Tobacco Use  Smoking Status Never  Smokeless Tobacco Never    Goals Met:  Independence with exercise equipment Exercise tolerated well No report of concerns or symptoms today Strength training completed today  Goals Unmet:  Not Applicable  Comments: Pt able to follow exercise prescription today without complaint.  Will continue to monitor for progression.    Dr. Oneil Pinal is Medical Director for [facility] Cardiac Rehabilitation.  Dr. Fuad Aleskerov is Medical Director for [facility] Pulmonary Rehabilitation.

## 2024-05-17 ENCOUNTER — Other Ambulatory Visit (HOSPITAL_BASED_OUTPATIENT_CLINIC_OR_DEPARTMENT_OTHER): Payer: Self-pay

## 2024-05-17 ENCOUNTER — Encounter

## 2024-05-17 DIAGNOSIS — Z955 Presence of coronary angioplasty implant and graft: Secondary | ICD-10-CM

## 2024-05-17 DIAGNOSIS — I213 ST elevation (STEMI) myocardial infarction of unspecified site: Secondary | ICD-10-CM

## 2024-05-17 NOTE — Progress Notes (Signed)
 Daily Session Note  Patient Details  Name: Debbie [name] MRN: [id number] Date of Birth: 1956/03/31 Referring Provider:   Flowsheet Row Cardiac Rehab from 04/05/2024 in [facility] Cardiac and Pulmonary Rehab  Referring Provider Dr. Redell Cave, MD    Encounter Date: 05/17/2024  Check In:  Session Check In - 05/17/24 0750       Check-In   Supervising physician immediately available to respond to emergencies See telemetry face sheet for immediately available ER MD    Location [facility]-Cardiac & Pulmonary Rehab    Staff Present Ronal Picket, RN, DNP, NE-BC;Aalyssa Elderkin RN,BSN,MPA;Laura Cates RN,BSN;Joseph [facility] BS, ACSM CEP, Exercise Physiologist    Virtual Visit No    Medication changes reported     No    Fall or balance concerns reported    No    Tobacco Cessation No Change    Warm-up and Cool-down Performed on first and last piece of equipment    Resistance Training Performed Yes    VAD Patient? No    PAD/SET Patient? No      Pain Assessment   Currently in Pain? No/denies             Social History   Tobacco Use  Smoking Status Never  Smokeless Tobacco Never    Goals Met:  Proper associated with RPD/PD & O2 Sat Independence with exercise equipment Exercise tolerated well No report of concerns or symptoms today Strength training completed today  Goals Unmet:  Not Applicable  Comments: Pt able to follow exercise prescription today without complaint.  Will continue to monitor for progression.    Dr. Oneil Pinal is Medical Director for [facility] Cardiac Rehabilitation.  Dr. Fuad Aleskerov is Medical Director for [facility] Pulmonary Rehabilitation.

## 2024-05-18 ENCOUNTER — Other Ambulatory Visit: Payer: Self-pay

## 2024-05-18 ENCOUNTER — Ambulatory Visit (INDEPENDENT_AMBULATORY_CARE_PROVIDER_SITE_OTHER): Payer: Self-pay | Admitting: Physical Therapy

## 2024-05-18 ENCOUNTER — Encounter: Payer: Self-pay | Admitting: Physical Therapy

## 2024-05-18 DIAGNOSIS — M79652 Pain in left thigh: Secondary | ICD-10-CM

## 2024-05-18 DIAGNOSIS — M25552 Pain in left hip: Secondary | ICD-10-CM

## 2024-05-18 DIAGNOSIS — M25562 Pain in left knee: Secondary | ICD-10-CM

## 2024-05-18 DIAGNOSIS — M5459 Other low back pain: Secondary | ICD-10-CM | POA: Diagnosis not present

## 2024-05-18 DIAGNOSIS — M6281 Muscle weakness (generalized): Secondary | ICD-10-CM

## 2024-05-18 DIAGNOSIS — G8929 Other chronic pain: Secondary | ICD-10-CM

## 2024-05-18 NOTE — Therapy (Signed)
 OUTPATIENT PHYSICAL THERAPY TREATMENT   Patient Name: Debbie [name] MRN: [id number] DOB:[birth year], 68 y.o., female Today's Date: 05/18/2024   END OF SESSION:  PT End of Session - 05/18/24 1451     Visit Number 2    Number of Visits 16    Date for Recertification  07/06/24    Authorization Type BCBS    PT Start Time 1435    PT Stop Time 1520    PT Time Calculation (min) 45 min    Activity Tolerance Patient tolerated treatment well    Behavior During Therapy WFL for tasks assessed/performed           Past Medical History:  Diagnosis Date   Genital warts    Hyperlipidemia    Hypertension    Myocardial infarction (HCC)    Peripheral arterial disease    Status post angioplasty with stent    Known femoral stent occlusion   Past Surgical History:  Procedure Laterality Date   ABDOMINAL AORTAGRAM N/A 08/09/2011   Procedure: ABDOMINAL EZELLA;  Surgeon: Carlin FORBES Haddock, MD;  Location: [facility] CATH LAB;  Service: Cardiovascular;  Laterality: N/A;   ANGIOPLASTY / STENTING FEMORAL  2008   Right femoral artery stenting done in [place]    COLONOSCOPY WITH PROPOFOL  N/A 06/11/2021   Procedure: COLONOSCOPY WITH PROPOFOL ;  Surgeon: Therisa Bi, MD;  Location: [facility] ENDOSCOPY;  Service: Gastroenterology;  Laterality: N/A;  2ND ARRIVAL, PLEASE   FEMORAL BYPASS Right 08/2011   Dr. Sherre at [facility]   Patient Active Problem List   Diagnosis Date Noted   Syncope and collapse 02/19/2024   Lumbar radiculopathy 01/07/2024   Colon cancer (HCC) 08/14/2021   Hypertension 11/28/2017   Vitamin D  deficiency 05/13/2012   Hypomagnesemia 11/11/2011   Hyperlipidemia 09/06/2011   Prediabetes 09/06/2011   PAD (peripheral artery disease) 08/01/2011   Pain in limb 08/01/2011   S/P angioplasty with stent 08/01/2011    PCP: Eliverto Bette Hover, MD  REFERRING PROVIDER: Joane Artist RAMAN, MD  REFERRING DIAG: Lumbar radiculopathy  Rationale for Evaluation and Treatment: Rehabilitation  THERAPY DIAG:   Other low back pain  Pain in left hip  Pain in left thigh  Chronic pain of left knee  Muscle weakness (generalized)  ONSET DATE: Chronic   SUBJECTIVE:          SUBJECTIVE STATEMENT: Patient reports she has been using a walking stick today because she has had to run a few errands. She did have a pilates class earlier today.   Eval: Patient reports chronic left hip and thigh pain, then in July she had a heart attack. She was discharged in August, and then when she returned home her left hip and knee were bothering her still. She does get some pain in her lower back with lifting heavy objects. The pain can radiate down the left thigh to the knee. The pain in the hip will bother her if she sleeps on her left side. She has been seeing a PT previously working on the left hip. She does use a walking stick occasionally if the left leg flares up. She does feel like when she walks she compensates so will use the walking stick. She does feel like she has made progress regarding the left hip because she has been walking with cardiac rehab and this has gotten much better. She continues to be limited primarily by the pain in the left thigh with walking and standing, and feels like she has to take pressure off the left leg. She  does do personal training 3x/week that focuses on her core, gait, and posture.  She is participating in cardiac rehab 3x/week and was seeing PT 2-3x/week.  PERTINENT HISTORY:  MI in June 2025  PAIN:  Are you having pain? Yes:  NPRS scale: 3/10 currently, 4-5/10 when pain occurs Pain location: Left thigh to the knee Pain description: Pressure, dull Aggravating factors: Walking, standing Relieving factors: Rest, stretching, exercise, using walking stick  PRECAUTIONS: Patient is currently participating in cardiac rehab for recent MI  PATIENT GOALS: Pain relief, improving walking and standing tolerance   OBJECTIVE:  Note: Objective measures were completed at Evaluation  unless otherwise noted. PATIENT SURVEYS:  PSFS: 5 Walking 22 laps on track / begin walkingon treadmill without pain in left thigh/knee: 4 Standing for long periods due to left thigh pain: 5 Heavy lifting due to lower back: 6  MUSCLE LENGTH: Limitations with bilateral hamstring and hip flexor/quad  POSTURE:   Rounded shoulder posture  PALPATION: Tender to palpation lumbar with hypomobility noted with lumbar CPAs  LUMBAR ROM:   AROM eval  Flexion WFL  Extension 50%  Right lateral flexion   Left lateral flexion   Right rotation 75%  Left rotation 75%   (Blank rows = not tested)  LOWER EXTREMITY ROM:      Hip PROM grossly WFL and non-painful  LOWER EXTREMITY MMT:    MMT Right eval Left eval  Hip flexion 4- 4-  Hip extension 4- 4-  Hip abduction 3+ 3  Hip adduction    Hip internal rotation    Hip external rotation    Knee flexion 5 5  Knee extension 5 5  Ankle dorsiflexion    Ankle plantarflexion    Ankle inversion    Ankle eversion     (Blank rows = not tested)  FUNCTIONAL TESTS:  30 sec stand test: 11 reps  GAIT: Assistive device utilized: None Level of assistance: Complete Independence Comments: Trendelenburg on left   TREATMENT  [facility] Adult PT Treatment:                                                DATE: 05/18/2024 Recumbent bike L4 x 5 min to improve endurance and workload capacity Prone lumbar PA mobilization  Prone press-up 2 x 10 Quadruped pelvic tilts x 10 Quadruped hydrant with red at knees x 10 each Quadruped donkey kick with red at knees x 10 each Bridge x 10 Bridge with alternating knee extension x 10 Bridge with blue band at knees 2 x 10 Goblet squat to table with 15# 3 x 10 Forward 8 runner step-up x 10 each  PATIENT EDUCATION:  Education details: HEP Person educated: Patient Education method: Programmer, Multimedia, Demonstration, Actor cues, Verbal cues Education comprehension: verbalized understanding, returned demonstration, verbal  cues required, tactile cues required, and needs further education  HOME EXERCISE PROGRAM: Access Code: [email or web address]   ASSESSMENT: CLINICAL IMPRESSION: Patient tolerated therapy well with no adverse effects. Therapy focused on improving lumbar mobility and progressing her core and hip strengthening with good tolerance. She does report feeling tension in the lower back with lumbar extension movements and exhibits difficulty with anterior tilting in quadruped position. She did report feeling crawling things around her left knee after the bridge with alternating knee extension exercise. She also exhibits difficulty with the forward step-up greater on the left. No changes to  her HEP this visit. Patient would benefit from continued skilled PT to progress mobility and strength in order to reduce pain and maximize functional ability.   Eval: Patient is a 68 y.o. female who was seen today for physical therapy evaluation and treatment for chronic left thigh and knee pain that do seem consistent with left lumbar radicular pain, but she also reports pain consistent with left GTPS and left knee pain. She does exhibit mobility deficit of the lumbar spine and gross strength deficit of the core and hip region. Extensive time spent discussing her current exercise and rehab routine as noted above. Patient demonstrated current core and hip exercises and provided patient quadruped pelvic tilt exercises to work of core neuromuscular control. She did report some lower back discomfort with supine core exercises likely due to weakness and mobility deficit, and reports left hip and thigh pain with hip abductor strengthening exercises. Patient would benefit from continued skilled PT to progress mobility and strength in order to reduce pain and maximize functional ability.  OBJECTIVE IMPAIRMENTS: Abnormal gait, decreased activity tolerance, decreased balance, decreased ROM, decreased strength, hypomobility, impaired flexibility,  postural dysfunction, and pain.   ACTIVITY LIMITATIONS: lifting, standing, sleeping, and locomotion level  PARTICIPATION LIMITATIONS: meal prep, cleaning, shopping, and community activity  PERSONAL FACTORS: Fitness, Past/current experiences, and Time since onset of injury/illness/exacerbation are also affecting patient's functional outcome.    GOALS: Goals reviewed with patient? Yes  SHORT TERM GOALS: Target date: 06/08/2024  Patient will be I with initial HEP in order to progress with therapy. Baseline: HEP provided at eval Goal status: INITIAL  2.  Patient will report left thigh, hip, knee pain </= 2/10 in order to reduce functional limitations and improve walking tolerance Baseline: 4-5/10 Goal status: INITIAL  LONG TERM GOALS: Target date: 07/06/2024  Patient will be I with final HEP to maintain progress from PT. Baseline: HEP provided at eval Goal status: INITIAL  2.  Patient will report PSFS >/= 8 in order to indicate improvement in their functional ability. Baseline: 5 Goal status: INITIAL  3.  Patient will demonstrate gross hip strength >/= 4/5 MMT to improve her walking and standing tolerance Baseline: see limitations above Goal status: INITIAL  4.  Patient will perform 30 sec stand test >/= 15 reps to indicate improved strength and endurance for better walking and standing tolerance Baseline: 11 reps Goal status: INITIAL   PLAN: PT FREQUENCY: 1-2x/week  PT DURATION: 8 weeks  PLANNED INTERVENTIONS: 97164- PT Re-evaluation, 97750- Physical Performance Testing, 97110-Therapeutic exercises, 97530- Therapeutic activity, 97112- Neuromuscular re-education, 97535- Self Care, 02859- Manual therapy, [id number]- Gait training, [phone] (1-2 muscles), 20561 (3+ muscles)- Dry Needling, Patient/Family education, Balance training, Joint mobilization, Joint manipulation, Spinal manipulation, Spinal mobilization, Cryotherapy, and Moist heat.  PLAN FOR NEXT SESSION: Review HEP and  progress PRN, manual for lumbar mobility, progress lumbar motion and stretching, progress hip and core strengthening/stabilization   Elaine Daring, PT, DPT, LAT, ATC 05/18/24  3:53 PM Phone: [phone] Fax: [phone]

## 2024-05-19 ENCOUNTER — Other Ambulatory Visit: Payer: Self-pay

## 2024-05-19 ENCOUNTER — Ambulatory Visit (INDEPENDENT_AMBULATORY_CARE_PROVIDER_SITE_OTHER): Admitting: Physical Therapy

## 2024-05-19 ENCOUNTER — Encounter

## 2024-05-19 ENCOUNTER — Encounter: Payer: Self-pay | Admitting: Physical Therapy

## 2024-05-19 ENCOUNTER — Encounter: Admitting: Physical Therapy

## 2024-05-19 DIAGNOSIS — I213 ST elevation (STEMI) myocardial infarction of unspecified site: Secondary | ICD-10-CM

## 2024-05-19 DIAGNOSIS — M6281 Muscle weakness (generalized): Secondary | ICD-10-CM

## 2024-05-19 DIAGNOSIS — M25552 Pain in left hip: Secondary | ICD-10-CM

## 2024-05-19 DIAGNOSIS — Z955 Presence of coronary angioplasty implant and graft: Secondary | ICD-10-CM | POA: Diagnosis not present

## 2024-05-19 DIAGNOSIS — M5459 Other low back pain: Secondary | ICD-10-CM | POA: Diagnosis not present

## 2024-05-19 DIAGNOSIS — G8929 Other chronic pain: Secondary | ICD-10-CM

## 2024-05-19 DIAGNOSIS — M25562 Pain in left knee: Secondary | ICD-10-CM

## 2024-05-19 DIAGNOSIS — M79652 Pain in left thigh: Secondary | ICD-10-CM | POA: Diagnosis not present

## 2024-05-19 NOTE — Therapy (Signed)
 OUTPATIENT PHYSICAL THERAPY TREATMENT   Patient Name: Debbie [name] MRN: [id number] DOB:1955-09-12, 68 y.o., female Today's Date: 05/19/2024   END OF SESSION:  PT End of Session - 05/19/24 1349     Visit Number 3    Number of Visits 16    Date for Recertification  07/06/24    Authorization Type BCBS    PT Start Time 1346    PT Stop Time 1440    PT Time Calculation (min) 54 min    Activity Tolerance Patient tolerated treatment well    Behavior During Therapy WFL for tasks assessed/performed            Past Medical History:  Diagnosis Date   Genital warts    Hyperlipidemia    Hypertension    Myocardial infarction (HCC)    Peripheral arterial disease    Status post angioplasty with stent    Known femoral stent occlusion   Past Surgical History:  Procedure Laterality Date   ABDOMINAL AORTAGRAM N/A 08/09/2011   Procedure: ABDOMINAL EZELLA;  Surgeon: Carlin FORBES Haddock, MD;  Location: [facility] CATH LAB;  Service: Cardiovascular;  Laterality: N/A;   ANGIOPLASTY / STENTING FEMORAL  2008   Right femoral artery stenting done in [place]    COLONOSCOPY WITH PROPOFOL  N/A 06/11/2021   Procedure: COLONOSCOPY WITH PROPOFOL ;  Surgeon: Therisa Bi, MD;  Location: [facility] ENDOSCOPY;  Service: Gastroenterology;  Laterality: N/A;  2ND ARRIVAL, PLEASE   FEMORAL BYPASS Right 08/2011   Dr. Sherre at [facility]   Patient Active Problem List   Diagnosis Date Noted   Syncope and collapse 02/19/2024   Lumbar radiculopathy 01/07/2024   Colon cancer (HCC) 08/14/2021   Hypertension 11/28/2017   Vitamin D  deficiency 05/13/2012   Hypomagnesemia 11/11/2011   Hyperlipidemia 09/06/2011   Prediabetes 09/06/2011   PAD (peripheral artery disease) 08/01/2011   Pain in limb 08/01/2011   S/P angioplasty with stent 08/01/2011    PCP: Eliverto Bette Hover, MD  REFERRING PROVIDER: Joane Artist RAMAN, MD  REFERRING DIAG: Lumbar radiculopathy  Rationale for Evaluation and Treatment: Rehabilitation  THERAPY DIAG:   Other low back pain  Pain in left hip  Pain in left thigh  Chronic pain of left knee  Muscle weakness (generalized)  ONSET DATE: Chronic   SUBJECTIVE:          SUBJECTIVE STATEMENT: Patient reports she is doing well. She had cardiac rehab and her personal trainer earlier today.  Eval: Patient reports chronic left hip and thigh pain, then in July she had a heart attack. She was discharged in August, and then when she returned home her left hip and knee were bothering her still. She does get some pain in her lower back with lifting heavy objects. The pain can radiate down the left thigh to the knee. The pain in the hip will bother her if she sleeps on her left side. She has been seeing a PT previously working on the left hip. She does use a walking stick occasionally if the left leg flares up. She does feel like when she walks she compensates so will use the walking stick. She does feel like she has made progress regarding the left hip because she has been walking with cardiac rehab and this has gotten much better. She continues to be limited primarily by the pain in the left thigh with walking and standing, and feels like she has to take pressure off the left leg. She does do personal training 3x/week that focuses on her core, gait,  and posture.  She is participating in cardiac rehab 3x/week and was seeing PT 2-3x/week.  PERTINENT HISTORY:  MI in June 2025  PAIN:  Are you having pain? Yes:  NPRS scale: 3/10 currently, 4-5/10 when pain occurs Pain location: Left thigh to the knee Pain description: Pressure, dull Aggravating factors: Walking, standing Relieving factors: Rest, stretching, exercise, using walking stick  PRECAUTIONS: Patient is currently participating in cardiac rehab for recent MI  PATIENT GOALS: Pain relief, improving walking and standing tolerance   OBJECTIVE:  Note: Objective measures were completed at Evaluation unless otherwise noted. PATIENT SURVEYS:  PSFS:  5 Walking 22 laps on track / begin walking on treadmill without pain in left thigh/knee: 4 Standing for long periods due to left thigh pain: 5 Heavy lifting due to lower back: 6  MUSCLE LENGTH: Limitations with bilateral hamstring and hip flexor/quad  POSTURE:   Rounded shoulder posture  PALPATION: Tender to palpation lumbar with hypomobility noted with lumbar CPAs  LUMBAR ROM:   AROM eval  Flexion WFL  Extension 50%  Right lateral flexion   Left lateral flexion   Right rotation 75%  Left rotation 75%   (Blank rows = not tested)  LOWER EXTREMITY ROM:      Hip PROM grossly WFL and non-painful  LOWER EXTREMITY MMT:    MMT Right eval Left eval  Hip flexion 4- 4-  Hip extension 4- 4-  Hip abduction 3+ 3  Hip adduction    Hip internal rotation    Hip external rotation    Knee flexion 5 5  Knee extension 5 5  Ankle dorsiflexion    Ankle plantarflexion    Ankle inversion    Ankle eversion     (Blank rows = not tested)  FUNCTIONAL TESTS:  30 sec stand test: 11 reps  GAIT: Assistive device utilized: None Level of assistance: Complete Independence Comments: Trendelenburg on left   TREATMENT  [facility] Adult PT Treatment:                                                DATE: 05/19/2024 LAQ with 7.5# 3 x 12 each SLR 3 x 15 each Deadlift with 30# 3 x 10 Lateral band walk with blue at knees 3 x 20 down/back Modified side plank on knees 2 x 8 x 5 sec each 90-90 alternating leg extension with stability ball 2 x 8 Figure-4 bridge 2 x 8 each SLS 3 x 30 sec each  PATIENT EDUCATION:  Education details: HEP update Person educated: Patient Education method: Explanation, Demonstration, Tactile cues, Verbal cues, Handout Education comprehension: verbalized understanding, returned demonstration, verbal cues required, tactile cues required, and needs further education  HOME EXERCISE PROGRAM: Access Code: [email or web address]   ASSESSMENT: CLINICAL IMPRESSION: Patient tolerated  therapy well with no adverse effects. Therapy focused on progressing strength of her LE, hips, and core with good tolerance. Incorporated balance training with SLS with patient demonstrating difficulty bilaterally and reporting more difficulty on the left. She was able to perform lifting this visit and progress with her core and hip strengthening exercises. She did not report any neural symptoms this visit.Patient did require extended rest breaks between sets to allow her HR to recover which was monitored using patient's watch. Updated her HEP to include SLS for balance and pelvic control training at home. Patient would benefit from continued skilled PT  to progress mobility and strength in order to reduce pain and maximize functional ability.   Eval: Patient is a 68 y.o. female who was seen today for physical therapy evaluation and treatment for chronic left thigh and knee pain that do seem consistent with left lumbar radicular pain, but she also reports pain consistent with left GTPS and left knee pain. She does exhibit mobility deficit of the lumbar spine and gross strength deficit of the core and hip region. Extensive time spent discussing her current exercise and rehab routine as noted above. Patient demonstrated current core and hip exercises and provided patient quadruped pelvic tilt exercises to work of core neuromuscular control. She did report some lower back discomfort with supine core exercises likely due to weakness and mobility deficit, and reports left hip and thigh pain with hip abductor strengthening exercises. Patient would benefit from continued skilled PT to progress mobility and strength in order to reduce pain and maximize functional ability.  OBJECTIVE IMPAIRMENTS: Abnormal gait, decreased activity tolerance, decreased balance, decreased ROM, decreased strength, hypomobility, impaired flexibility, postural dysfunction, and pain.   ACTIVITY LIMITATIONS: lifting, standing, sleeping, and  locomotion level  PARTICIPATION LIMITATIONS: meal prep, cleaning, shopping, and community activity  PERSONAL FACTORS: Fitness, Past/current experiences, and Time since onset of injury/illness/exacerbation are also affecting patient's functional outcome.    GOALS: Goals reviewed with patient? Yes  SHORT TERM GOALS: Target date: 06/08/2024  Patient will be I with initial HEP in order to progress with therapy. Baseline: HEP provided at eval Goal status: INITIAL  2.  Patient will report left thigh, hip, knee pain </= 2/10 in order to reduce functional limitations and improve walking tolerance Baseline: 4-5/10 Goal status: INITIAL  LONG TERM GOALS: Target date: 07/06/2024  Patient will be I with final HEP to maintain progress from PT. Baseline: HEP provided at eval Goal status: INITIAL  2.  Patient will report PSFS >/= 8 in order to indicate improvement in their functional ability. Baseline: 5 Goal status: INITIAL  3.  Patient will demonstrate gross hip strength >/= 4/5 MMT to improve her walking and standing tolerance Baseline: see limitations above Goal status: INITIAL  4.  Patient will perform 30 sec stand test >/= 15 reps to indicate improved strength and endurance for better walking and standing tolerance Baseline: 11 reps Goal status: INITIAL   PLAN: PT FREQUENCY: 1-2x/week  PT DURATION: 8 weeks  PLANNED INTERVENTIONS: 97164- PT Re-evaluation, 97750- Physical Performance Testing, 97110-Therapeutic exercises, 97530- Therapeutic activity, 97112- Neuromuscular re-education, 97535- Self Care, 02859- Manual therapy, [id number]- Gait training, [phone] (1-2 muscles), 20561 (3+ muscles)- Dry Needling, Patient/Family education, Balance training, Joint mobilization, Joint manipulation, Spinal manipulation, Spinal mobilization, Cryotherapy, and Moist heat.  PLAN FOR NEXT SESSION: Review HEP and progress PRN, manual for lumbar mobility, progress lumbar motion and stretching, progress hip  and core strengthening/stabilization   Elaine Daring, PT, DPT, LAT, ATC 05/19/24  4:25 PM Phone: [phone] Fax: [phone]

## 2024-05-19 NOTE — Progress Notes (Signed)
 Daily Session Note  Patient Details  Name: Debbie [name] MRN: [id number] Date of Birth: 1955-09-23 Referring Provider:   Flowsheet Row Cardiac Rehab from 04/05/2024 in [facility] Cardiac and Pulmonary Rehab  Referring Provider Dr. Redell Cave, MD    Encounter Date: 05/19/2024  Check In:  Session Check In - 05/19/24 0722       Check-In   Supervising physician immediately available to respond to emergencies See telemetry face sheet for immediately available ER MD    Location [facility]-Cardiac & Pulmonary Rehab    Staff Present Burnard Davenport RN,BSN,MPA;Joseph Hood RCP,RRT,BSRT;Maxon Burnell BS, Exercise Physiologist;Margaret Best, MS, Exercise Physiologist    Virtual Visit No    Medication changes reported     No    Fall or balance concerns reported    No    Tobacco Cessation No Change    Warm-up and Cool-down Performed on first and last piece of equipment    Resistance Training Performed Yes    VAD Patient? No    PAD/SET Patient? No      Pain Assessment   Currently in Pain? No/denies             Social History   Tobacco Use  Smoking Status Never  Smokeless Tobacco Never    Goals Met:  Proper associated with RPD/PD & O2 Sat Independence with exercise equipment Exercise tolerated well No report of concerns or symptoms today Strength training completed today  Goals Unmet:  Not Applicable  Comments: Pt able to follow exercise prescription today without complaint.  Will continue to monitor for progression.    Dr. Oneil Pinal is Medical Director for [facility] Cardiac Rehabilitation.  Dr. Fuad Aleskerov is Medical Director for [facility] Pulmonary Rehabilitation.

## 2024-05-19 NOTE — Patient Instructions (Signed)
 Access Code: [email or web address] URL: [email or web address] Date: 05/19/2024 Prepared by: Elaine Daring  Exercises - Quadruped Pelvic Tilt  - 1 x daily - 10 reps - Single Leg Stance  - 1 x daily - 3 reps - 30 seconds hold

## 2024-05-21 ENCOUNTER — Encounter

## 2024-05-21 DIAGNOSIS — I213 ST elevation (STEMI) myocardial infarction of unspecified site: Secondary | ICD-10-CM

## 2024-05-21 DIAGNOSIS — Z955 Presence of coronary angioplasty implant and graft: Secondary | ICD-10-CM | POA: Diagnosis not present

## 2024-05-21 NOTE — Progress Notes (Signed)
 Daily Session Note  Patient Details  Name: Debbie [name] MRN: [id number] Date of Birth: 1955/07/18 Referring Provider:   Flowsheet Row Cardiac Rehab from 04/05/2024 in [facility] Cardiac and Pulmonary Rehab  Referring Provider Dr. Redell Cave, MD    Encounter Date: 05/21/2024  Check In:  Session Check In - 05/21/24 0717       Check-In   Supervising physician immediately available to respond to emergencies See telemetry face sheet for immediately available ER MD    Location [facility]-Cardiac & Pulmonary Rehab    Staff Present Burnard Davenport RN,BSN,MPA;Joseph Rolinda RCP,RRT,BSRT;Noah Tickle, [place], Exercise Physiologist    Virtual Visit No    Medication changes reported     No    Fall or balance concerns reported    No    Tobacco Cessation No Change    Warm-up and Cool-down Performed on first and last piece of equipment    Resistance Training Performed Yes    VAD Patient? No    PAD/SET Patient? No      Pain Assessment   Currently in Pain? No/denies             Social History   Tobacco Use  Smoking Status Never  Smokeless Tobacco Never    Goals Met:  Proper associated with RPD/PD & O2 Sat Independence with exercise equipment Exercise tolerated well No report of concerns or symptoms today Strength training completed today  Goals Unmet:  Not Applicable  Comments: Pt able to follow exercise prescription today without complaint.  Will continue to monitor for progression.    Dr. Oneil Pinal is Medical Director for [facility] Cardiac Rehabilitation.  Dr. Fuad Aleskerov is Medical Director for [facility] Pulmonary Rehabilitation.

## 2024-05-24 ENCOUNTER — Encounter

## 2024-05-24 DIAGNOSIS — Z955 Presence of coronary angioplasty implant and graft: Secondary | ICD-10-CM

## 2024-05-24 DIAGNOSIS — I213 ST elevation (STEMI) myocardial infarction of unspecified site: Secondary | ICD-10-CM

## 2024-05-24 NOTE — Progress Notes (Signed)
 Daily Session Note  Patient Details  Name: Debbie [name] MRN: [id number] Date of Birth: [birth year] Referring Provider:   Flowsheet Row Cardiac Rehab from 04/05/2024 in [facility] Cardiac and Pulmonary Rehab  Referring Provider Dr. Redell Cave, MD    Encounter Date: 05/24/2024  Check In:  Session Check In - 05/24/24 0747       Check-In   Supervising physician immediately available to respond to emergencies See telemetry face sheet for immediately available ER MD    Location [facility]-Cardiac & Pulmonary Rehab    Staff Present Burnard Davenport RN,BSN,MPA;Joseph Rolinda RCP,RRT,BSRT;Laura Cates RN,BSN;Doreene Forrey Dyane BS, ACSM CEP, Exercise Physiologist    Virtual Visit No    Medication changes reported     No    Fall or balance concerns reported    No    Tobacco Cessation No Change    Warm-up and Cool-down Performed on first and last piece of equipment    Resistance Training Performed Yes    VAD Patient? No    PAD/SET Patient? No      Pain Assessment   Currently in Pain? No/denies             Social History   Tobacco Use  Smoking Status Never  Smokeless Tobacco Never    Goals Met:  Proper associated with RPD/PD & O2 Sat Independence with exercise equipment Exercise tolerated well No report of concerns or symptoms today Strength training completed today  Goals Unmet:  Not Applicable  Comments: Pt able to follow exercise prescription today without complaint.  Will continue to monitor for progression.    Dr. Oneil Pinal is Medical Director for [facility] Cardiac Rehabilitation.  Dr. Fuad Aleskerov is Medical Director for [facility] Pulmonary Rehabilitation.

## 2024-05-25 ENCOUNTER — Ambulatory Visit (INDEPENDENT_AMBULATORY_CARE_PROVIDER_SITE_OTHER): Admitting: Physical Therapy

## 2024-05-25 ENCOUNTER — Other Ambulatory Visit: Payer: Self-pay

## 2024-05-25 ENCOUNTER — Encounter: Payer: Self-pay | Admitting: Physical Therapy

## 2024-05-25 DIAGNOSIS — G8929 Other chronic pain: Secondary | ICD-10-CM

## 2024-05-25 DIAGNOSIS — M25562 Pain in left knee: Secondary | ICD-10-CM | POA: Diagnosis not present

## 2024-05-25 DIAGNOSIS — M79652 Pain in left thigh: Secondary | ICD-10-CM

## 2024-05-25 DIAGNOSIS — M5459 Other low back pain: Secondary | ICD-10-CM | POA: Diagnosis not present

## 2024-05-25 DIAGNOSIS — M25552 Pain in left hip: Secondary | ICD-10-CM

## 2024-05-25 DIAGNOSIS — M6281 Muscle weakness (generalized): Secondary | ICD-10-CM

## 2024-05-25 NOTE — Therapy (Signed)
 OUTPATIENT PHYSICAL THERAPY TREATMENT   Patient Name: Debbie [name] MRN: [id number] DOB:[birth year], 68 y.o., female Today's Date: 05/25/2024   END OF SESSION:  PT End of Session - 05/25/24 1019     Visit Number 4    Number of Visits 16    Date for Recertification  07/06/24    Authorization Type BCBS    PT Start Time 1016    PT Stop Time 1100    PT Time Calculation (min) 44 min    Activity Tolerance Patient tolerated treatment well    Behavior During Therapy WFL for tasks assessed/performed             Past Medical History:  Diagnosis Date   Genital warts    Hyperlipidemia    Hypertension    Myocardial infarction (HCC)    Peripheral arterial disease    Status post angioplasty with stent    Known femoral stent occlusion   Past Surgical History:  Procedure Laterality Date   ABDOMINAL AORTAGRAM N/A 08/09/2011   Procedure: ABDOMINAL EZELLA;  Surgeon: Carlin FORBES Haddock, MD;  Location: [facility] CATH LAB;  Service: Cardiovascular;  Laterality: N/A;   ANGIOPLASTY / STENTING FEMORAL  2008   Right femoral artery stenting done in [place]    COLONOSCOPY WITH PROPOFOL  N/A 06/11/2021   Procedure: COLONOSCOPY WITH PROPOFOL ;  Surgeon: Therisa Bi, MD;  Location: [facility] ENDOSCOPY;  Service: Gastroenterology;  Laterality: N/A;  2ND ARRIVAL, PLEASE   FEMORAL BYPASS Right 08/2011   Dr. Sherre at [facility]   Patient Active Problem List   Diagnosis Date Noted   Syncope and collapse 02/19/2024   Lumbar radiculopathy 01/07/2024   Colon cancer (HCC) 08/14/2021   Hypertension 11/28/2017   Vitamin D  deficiency 05/13/2012   Hypomagnesemia 11/11/2011   Hyperlipidemia 09/06/2011   Prediabetes 09/06/2011   PAD (peripheral artery disease) 08/01/2011   Pain in limb 08/01/2011   S/P angioplasty with stent 08/01/2011    PCP: Eliverto Bette Hover, MD  REFERRING PROVIDER: Joane Artist RAMAN, MD  REFERRING DIAG: Lumbar radiculopathy  Rationale for Evaluation and Treatment: Rehabilitation  THERAPY DIAG:   Other low back pain  Pain in left hip  Pain in left thigh  Chronic pain of left knee  Muscle weakness (generalized)  ONSET DATE: Chronic   SUBJECTIVE:          SUBJECTIVE STATEMENT: Patient reports she had a few good days but then this morning she was having some pain in her left knee and outside of the thigh.   Eval: Patient reports chronic left hip and thigh pain, then in July she had a heart attack. She was discharged in August, and then when she returned home her left hip and knee were bothering her still. She does get some pain in her lower back with lifting heavy objects. The pain can radiate down the left thigh to the knee. The pain in the hip will bother her if she sleeps on her left side. She has been seeing a PT previously working on the left hip. She does use a walking stick occasionally if the left leg flares up. She does feel like when she walks she compensates so will use the walking stick. She does feel like she has made progress regarding the left hip because she has been walking with cardiac rehab and this has gotten much better. She continues to be limited primarily by the pain in the left thigh with walking and standing, and feels like she has to take pressure off the left leg.  She does do personal training 3x/week that focuses on her core, gait, and posture.  She is participating in cardiac rehab 3x/week and was seeing PT 2-3x/week.  PERTINENT HISTORY:  MI in June 2025  PAIN:  Are you having pain? Yes:  NPRS scale: 1/10 currently, 4-5/10 when pain occurs Pain location: Left thigh to the knee Pain description: Discomfort Aggravating factors: Walking, standing Relieving factors: Rest, stretching, exercise, using walking stick  PRECAUTIONS: Patient is currently participating in cardiac rehab for recent MI  PATIENT GOALS: Pain relief, improving walking and standing tolerance   OBJECTIVE:  Note: Objective measures were completed at Evaluation unless otherwise  noted. PATIENT SURVEYS:  PSFS: 5 Walking 22 laps on track / begin walking on treadmill without pain in left thigh/knee: 4 Standing for long periods due to left thigh pain: 5 Heavy lifting due to lower back: 6  MUSCLE LENGTH: Limitations with bilateral hamstring and hip flexor/quad  POSTURE:   Rounded shoulder posture  PALPATION: Tender to palpation lumbar with hypomobility noted with lumbar CPAs  LUMBAR ROM:   AROM eval  Flexion WFL  Extension 50%  Right lateral flexion   Left lateral flexion   Right rotation 75%  Left rotation 75%   (Blank rows = not tested)  LOWER EXTREMITY ROM:      Hip PROM grossly WFL and non-painful  LOWER EXTREMITY MMT:    MMT Right eval Left eval  Hip flexion 4- 4-  Hip extension 4- 4-  Hip abduction 3+ 3  Hip adduction    Hip internal rotation    Hip external rotation    Knee flexion 5 5  Knee extension 5 5  Ankle dorsiflexion    Ankle plantarflexion    Ankle inversion    Ankle eversion     (Blank rows = not tested)  FUNCTIONAL TESTS:  30 sec stand test: 11 reps  GAIT: Assistive device utilized: None Level of assistance: Complete Independence Comments: Trendelenburg on left   TREATMENT  [facility] Adult PT Treatment:                                                DATE: 05/25/2024 Supine ITB stretch 3 x 20 sec each Side clamshell with green 3 x 15 left Figure-4 bridge 3 x 10 each Deadlift with 30# 3 x 10 Lateral band walk with blue at knees 3 x 20 down/back SLS 3 x 30 sec  PATIENT EDUCATION:  Education details: HEP update Person educated: Patient Education method: Explanation, Demonstration, Tactile cues, Verbal cues, Handout Education comprehension: verbalized understanding, returned demonstration, verbal cues required, tactile cues required, and needs further education  HOME EXERCISE PROGRAM: Access Code: [email or web address]   ASSESSMENT: CLINICAL IMPRESSION: Patient tolerated therapy well with no adverse effects. She arrives  reporting a little discomfort of the left knee and lateral thigh this visit, where she has not had any pain the past few days. Therapy incorporated some ITB stretching with patient reporting increased tension on the left, and progressing her hip and core strengthening. She does continue to exhibit limitations with her single leg stance and stability bilaterally, and she reports greater difficulty with left sided exercises. No changes to her HEP but encouraged her to use banded resistance for hip strengthening to promote left glute strengthening. Patient would benefit from continued skilled PT to progress mobility and strength in order to reduce  pain and maximize functional ability.   Eval: Patient is a 68 y.o. female who was seen today for physical therapy evaluation and treatment for chronic left thigh and knee pain that do seem consistent with left lumbar radicular pain, but she also reports pain consistent with left GTPS and left knee pain. She does exhibit mobility deficit of the lumbar spine and gross strength deficit of the core and hip region. Extensive time spent discussing her current exercise and rehab routine as noted above. Patient demonstrated current core and hip exercises and provided patient quadruped pelvic tilt exercises to work of core neuromuscular control. She did report some lower back discomfort with supine core exercises likely due to weakness and mobility deficit, and reports left hip and thigh pain with hip abductor strengthening exercises. Patient would benefit from continued skilled PT to progress mobility and strength in order to reduce pain and maximize functional ability.  OBJECTIVE IMPAIRMENTS: Abnormal gait, decreased activity tolerance, decreased balance, decreased ROM, decreased strength, hypomobility, impaired flexibility, postural dysfunction, and pain.   ACTIVITY LIMITATIONS: lifting, standing, sleeping, and locomotion level  PARTICIPATION LIMITATIONS: meal prep,  cleaning, shopping, and community activity  PERSONAL FACTORS: Fitness, Past/current experiences, and Time since onset of injury/illness/exacerbation are also affecting patient's functional outcome.    GOALS: Goals reviewed with patient? Yes  SHORT TERM GOALS: Target date: 06/08/2024  Patient will be I with initial HEP in order to progress with therapy. Baseline: HEP provided at eval Goal status: INITIAL  2.  Patient will report left thigh, hip, knee pain </= 2/10 in order to reduce functional limitations and improve walking tolerance Baseline: 4-5/10 Goal status: INITIAL  LONG TERM GOALS: Target date: 07/06/2024  Patient will be I with final HEP to maintain progress from PT. Baseline: HEP provided at eval Goal status: INITIAL  2.  Patient will report PSFS >/= 8 in order to indicate improvement in their functional ability. Baseline: 5 Goal status: INITIAL  3.  Patient will demonstrate gross hip strength >/= 4/5 MMT to improve her walking and standing tolerance Baseline: see limitations above Goal status: INITIAL  4.  Patient will perform 30 sec stand test >/= 15 reps to indicate improved strength and endurance for better walking and standing tolerance Baseline: 11 reps Goal status: INITIAL   PLAN: PT FREQUENCY: 1-2x/week  PT DURATION: 8 weeks  PLANNED INTERVENTIONS: 97164- PT Re-evaluation, 97750- Physical Performance Testing, 97110-Therapeutic exercises, 97530- Therapeutic activity, 97112- Neuromuscular re-education, 97535- Self Care, [id number]- Manual therapy, [id number]- Gait training, [phone] (1-2 muscles), [id number] (3+ muscles)- Dry Needling, Patient/Family education, Balance training, Joint mobilization, Joint manipulation, Spinal manipulation, Spinal mobilization, Cryotherapy, and Moist heat.  PLAN FOR NEXT SESSION: Review HEP and progress PRN, manual for lumbar mobility, progress lumbar motion and stretching, progress hip and core strengthening/stabilization   Elaine Daring,  PT, DPT, LAT, ATC 05/25/24  11:07 AM Phone: [phone] Fax: [phone]

## 2024-05-26 ENCOUNTER — Encounter: Payer: Self-pay | Admitting: *Deleted

## 2024-05-26 ENCOUNTER — Encounter: Admitting: *Deleted

## 2024-05-26 VITALS — Ht 64.5 in | Wt 189.2 lb

## 2024-05-26 DIAGNOSIS — I213 ST elevation (STEMI) myocardial infarction of unspecified site: Secondary | ICD-10-CM

## 2024-05-26 DIAGNOSIS — Z955 Presence of coronary angioplasty implant and graft: Secondary | ICD-10-CM | POA: Diagnosis not present

## 2024-05-26 NOTE — Patient Instructions (Signed)
 Discharge Patient Instructions  Patient Details  Name: Debbie [name] MRN: [id number] Date of Birth: [birth year] Referring Provider:  Eliverto Bette Hover, *   Number of Visits: 36  Reason for Discharge:  Patient reached a stable level of exercise. Patient independent in their exercise. Patient has met program and personal goals.  Diagnosis:  Status post coronary artery stent placement  ST elevation myocardial infarction (STEMI), unspecified artery [facility])  Initial Exercise Prescription:  Initial Exercise Prescription - 04/05/24 1100       Date of Initial Exercise RX and Referring Provider   Date 04/05/24    Referring Provider Dr. Redell Cave, MD      Oxygen   Maintain Oxygen Saturation 88% or higher      Recumbant Bike   Level 2    RPM 50    Watts 12    Minutes 15    METs 2.41      NuStep   Level 2    SPM 80    Minutes 15    METs 2.41      Track   Laps 25    Minutes 15    METs 2.36      Prescription Details   Frequency (times per week) 3    Duration Progress to 30 minutes of continuous aerobic without signs/symptoms of physical distress      Intensity   THRR 40-80% of Max Heartrate 109-136    Ratings of Perceived Exertion 11-13    Perceived Dyspnea 0-4      Progression   Progression Continue to progress workloads to maintain intensity without signs/symptoms of physical distress.      Resistance Training   Training Prescription Yes    Weight 3 lb    Reps 10-15          Discharge Exercise Prescription (Final Exercise Prescription Changes):  Exercise Prescription Changes - 05/18/24 1400       Response to Exercise   Blood Pressure (Admit) 110/64    Blood Pressure (Exit) 118/70    Heart Rate (Admit) 72 bpm    Heart Rate (Exercise) 126 bpm    Heart Rate (Exit) 89 bpm    Oxygen Saturation (Admit) 99 %    Oxygen Saturation (Exercise) 96 %    Oxygen Saturation (Exit) 97 %    Rating of Perceived Exertion (Exercise) 13    Symptoms none     Duration Progress to 30 minutes of  aerobic without signs/symptoms of physical distress    Intensity THRR unchanged      Progression   Progression Continue to progress workloads to maintain intensity without signs/symptoms of physical distress.    Average METs 2.99      Resistance Training   Training Prescription Yes    Weight 3 lb    Reps 10-15      Interval Training   Interval Training No      Recumbant Bike   Level 4    Watts 25    Minutes 15    METs 2.9      NuStep   Level 4    Minutes 15    METs 4.4      T5 Nustep   Level 3    SPM 80    Minutes 15    METs 1.8      Track   Laps 23    Minutes 15    METs 2.25      Home Exercise Plan   Plans to continue exercise at  [employer] (comment)   [employer] for strength training with a personal trainer, [employer] for bike and PT   Frequency Add 2 additional days to program exercise sessions.    Initial Home Exercises Provided 05/05/24      Oxygen   Maintain Oxygen Saturation 88% or higher          Functional Capacity:  6 Minute Walk     Row Name 04/05/24 1048 05/26/24 0747       6 Minute Walk   Phase Initial Initial    Distance 985 feet 1445 feet    Distance % Change -- 46.7 %    Distance Feet Change -- 460 ft    Walk Time 5.3 minutes 6 minutes    # of Rest Breaks 2 0    MPH 2.11 2.74    METS 2.41 3.31    RPE 13 14    Perceived Dyspnea  0 0    VO2 Peak 8.42 11.58    Symptoms Yes (comment) Yes (comment)    Comments Left leg pain 5/10 uses cane, Left leg pain 4/10    Resting HR 83 bpm 78 bpm    Resting BP 140/84 122/74    Resting Oxygen Saturation  100 % 98 %    Exercise Oxygen Saturation  during 6 min walk 98 % 95 %    Max Ex. HR 116 bpm 121 bpm    Max Ex. BP 168/84 158/78    2 Minute Post BP 138/76 --      Nutrition & Weight - Outcomes:  Pre Biometrics - 04/05/24 1106       Pre Biometrics   Height 5' 4.5 (1.638 m)    Weight 203 lb 9.6 oz (92.4 kg)    Waist Circumference 43  inches    Hip Circumference 47 inches    Waist to Hip Ratio 0.91 %    BMI (Calculated) 34.42    Single Leg Stand 7.3 seconds          Post Biometrics - 05/26/24 0749        Post  Biometrics   Height 5' 4.5 (1.638 m)    Weight 189 lb 3.2 oz (85.8 kg)    Waist Circumference 39 inches    Hip Circumference 44.5 inches    Waist to Hip Ratio 0.88 %    BMI (Calculated) 31.99    Single Leg Stand 13.9 seconds          Nutrition:  Nutrition Therapy & Goals - 04/05/24 1308       Nutrition Therapy   Diet Cardiac, Low Na    Protein (specify units) 80    Fiber 25 grams    Whole Grain Foods 3 servings    Saturated Fats 12 max. grams    Fruits and Vegetables 5 servings/day    Sodium 1.5 grams      Personal Nutrition Goals   Nutrition Goal Eat 3 times per day, small frequent meals or nutrient dense snacks    Personal Goal #2 Eat 15-30gProtein and 30-60gCarbs at each meal.    Personal Goal #3 Read labels and reduce sodium intake to below 2300mg . Ideally 1500mg  per day.    Comments Patient on zepbound  and says she has been seeing a RD at a weight loss program at [facility]. She eats small meals and says her appetite is reduced. Reminded her of the importance of eating smaller more frequent meals and snacks with nutrient dense foods. Focus on protein,  with goal or ~80g per day. Encouraged her to include more healthy fats if her appetite prevents her from eating at least 1200kcal. Reviewed mediterranean diet handout. Educated on types of fats, sources, and how to read labels. Encouraged her to read labels and limit saturated fat to less than 12g per day and less than 1500mg  of sodium per day. Brainstormed several small meals and snacks with foods she likes and will eat.      Intervention Plan   Intervention Prescribe, educate and counsel regarding individualized specific dietary modifications aiming towards targeted core components such as weight, hypertension, lipid management, diabetes, heart  failure and other comorbidities.;Nutrition handout(s) given to patient.    Expected Outcomes Short Term Goal: Understand basic principles of dietary content, such as calories, fat, sodium, cholesterol and nutrients.;Short Term Goal: A plan has been developed with personal nutrition goals set during dietitian appointment.;Long Term Goal: Adherence to prescribed nutrition plan.

## 2024-05-26 NOTE — Progress Notes (Signed)
 Cardiac Individual Treatment Plan  Patient Details  Name: Debbie [name] MRN: [id number] Date of Birth: [birth year] Referring Provider:   Flowsheet Row Cardiac Rehab from 04/05/2024 in [facility] Cardiac and Pulmonary Rehab  Referring Provider Dr. Redell Cave, MD    Initial Encounter Date:  Flowsheet Row Cardiac Rehab from 04/05/2024 in [facility] Cardiac and Pulmonary Rehab  Date 04/05/24    Visit Diagnosis: Status post coronary artery stent placement  ST elevation myocardial infarction (STEMI), unspecified artery (HCC)  Patient's Home Medications on Admission:  Current Outpatient Medications:    aspirin  EC 81 MG tablet, Take 1 tablet (81 mg total) by mouth daily., Disp: 30 tablet, Rfl: 3   docusate sodium  (COLACE) 100 MG capsule, Take 100 mg by mouth 2 (two) times daily., Disp: , Rfl:    metoprolol  succinate (TOPROL -XL) 50 MG 24 hr tablet, Take 1 tablet (50 mg total) by mouth daily., Disp: 30 tablet, Rfl: 3   mupirocin ointment (BACTROBAN) 2 %, Apply 1 Application topically 2 (two) times daily., Disp: , Rfl:    pantoprazole  (PROTONIX ) 40 MG tablet, Take 40 mg by mouth daily before breakfast. (Patient not taking: Reported on 05/04/2024), Disp: , Rfl:    rosuvastatin  (CRESTOR ) 40 MG tablet, Take 1 tablet (40 mg total) by mouth at bedtime., Disp: 30 tablet, Rfl: 3   ticagrelor  (BRILINTA ) 90 MG TABS tablet, Take 1 tablet (90 mg total) by mouth 2 (two) times daily., Disp: 60 tablet, Rfl: 3   tirzepatide  (ZEPBOUND ) 15 MG/0.5ML Pen, Inject 15 mg into the skin once a week., Disp: 2 mL, Rfl: 2  Past Medical History: Past Medical History:  Diagnosis Date   Genital warts    Hyperlipidemia    Hypertension    Myocardial infarction [facility])    Peripheral arterial disease    Status post angioplasty with stent    Known femoral stent occlusion    Tobacco Use: Social History   Tobacco Use  Smoking Status Never  Smokeless Tobacco Never    Labs: Review Flowsheet       Latest Ref Rng & Units  08/09/2011 11/28/2017 02/20/2024  Labs for ITP Cardiac and Pulmonary Rehab  Cholestrol 100 - 199 mg/dL - 791  -  LDL (calc) 0 - 99 mg/dL - 861  -  HDL-C >60 mg/dL - 47  -  Trlycerides 0 - 149 mg/dL - 882  -  Hemoglobin J8r 4.8 - 5.6 % 6.8  5.9  5.4   TCO2 0 - 100 mmol/L 24  - -     Exercise Target Goals: Exercise Program Goal: Individual exercise prescription set using results from initial 6 min walk test and THRR while considering  patient's activity barriers and safety.   Exercise Prescription Goal: Initial exercise prescription builds to 30-45 minutes a day of aerobic activity, 2-3 days per week.  Home exercise guidelines will be given to patient during program as part of exercise prescription that the participant will acknowledge.   Education: Aerobic Exercise: - Group verbal and visual presentation on the components of exercise prescription. Introduces F.I.T.T principle from ACSM for exercise prescriptions.  Reviews F.I.T.T. principles of aerobic exercise including progression. Written material provided at class time. Flowsheet Row Cardiac Rehab from 05/26/2024 in [facility] Cardiac and Pulmonary Rehab  Date 04/21/24  Educator nt  Instruction Review Code 1- Tefl Teacher Understanding    Education: Resistance Exercise: - Group verbal and visual presentation on the components of exercise prescription. Introduces F.I.T.T principle from ACSM for exercise prescriptions  Reviews F.I.T.T. principles of  resistance exercise including progression. Written material provided at class time. Flowsheet Row Cardiac Rehab from 05/26/2024 in [facility] Cardiac and Pulmonary Rehab  Date 04/14/24  Educator nt  Instruction Review Code 1- Tefl Teacher Understanding     Education: Exercise & Equipment Safety: - Individual verbal instruction and demonstration of equipment use and safety with use of the equipment. Flowsheet Row Cardiac Rehab from 05/26/2024 in [facility] Cardiac and Pulmonary Rehab  Date 04/05/24  Educator  NT  Instruction Review Code 1- Verbalizes Understanding    Education: Exercise Physiology & General Exercise Guidelines: - Group verbal and written instruction with models to review the exercise physiology of the cardiovascular system and associated critical values. Provides general exercise guidelines with specific guidelines to those with heart or lung disease. Written material provided at class time. Flowsheet Row Cardiac Rehab from 05/26/2024 in [facility] Cardiac and Pulmonary Rehab  Date 04/07/24  Educator nt  Instruction Review Code 1- Tefl Teacher Understanding    Education: Flexibility, Balance, Mind/Body Relaxation: - Group verbal and visual presentation with interactive activity on the components of exercise prescription. Introduces F.I.T.T principle from ACSM for exercise prescriptions. Reviews F.I.T.T. principles of flexibility and balance exercise training including progression. Also discusses the mind body connection.  Reviews various relaxation techniques to help reduce and manage stress (i.e. Deep breathing, progressive muscle relaxation, and visualization). Balance handout provided to take home. Written material provided at class time. Flowsheet Row Cardiac Rehab from 05/26/2024 in [facility] Cardiac and Pulmonary Rehab  Date 04/14/24  Educator nt  Instruction Review Code 1- Verbalizes Understanding    Activity Barriers & Risk Stratification:  Activity Barriers & Cardiac Risk Stratification - 04/05/24 1102       Activity Barriers & Cardiac Risk Stratification   Activity Barriers Joint Problems;Neck/Spine Problems;Deconditioning;Assistive Device;Other (comment)    Comments L hip bursitis, L3/L4 nerve impingement    Cardiac Risk Stratification High          6 Minute Walk:  6 Minute Walk     Row Name 04/05/24 1048 05/26/24 0747       6 Minute Walk   Phase Initial Initial    Distance 985 feet 1445 feet    Distance % Change -- 46.7 %    Distance Feet Change -- 460 ft     Walk Time 5.3 minutes 6 minutes    # of Rest Breaks 2 0    MPH 2.11 2.74    METS 2.41 3.31    RPE 13 14    Perceived Dyspnea  0 0    VO2 Peak 8.42 11.58    Symptoms Yes (comment) Yes (comment)    Comments Left leg pain 5/10 uses cane, Left leg pain 4/10    Resting HR 83 bpm 78 bpm    Resting BP 140/84 122/74    Resting Oxygen Saturation  100 % 98 %    Exercise Oxygen Saturation  during 6 min walk 98 % 95 %    Max Ex. HR 116 bpm 121 bpm    Max Ex. BP 168/84 158/78    2 Minute Post BP 138/76 --       Oxygen Initial Assessment:   Oxygen Re-Evaluation:   Oxygen Discharge (Final Oxygen Re-Evaluation):   Initial Exercise Prescription:  Initial Exercise Prescription - 04/05/24 1100       Date of Initial Exercise RX and Referring Provider   Date 04/05/24    Referring Provider Dr. Redell Cave, MD      Oxygen   Maintain Oxygen  Saturation 88% or higher      Recumbant Bike   Level 2    RPM 50    Watts 12    Minutes 15    METs 2.41      NuStep   Level 2    SPM 80    Minutes 15    METs 2.41      Track   Laps 25    Minutes 15    METs 2.36      Prescription Details   Frequency (times per week) 3    Duration Progress to 30 minutes of continuous aerobic without signs/symptoms of physical distress      Intensity   THRR 40-80% of Max Heartrate 109-136    Ratings of Perceived Exertion 11-13    Perceived Dyspnea 0-4      Progression   Progression Continue to progress workloads to maintain intensity without signs/symptoms of physical distress.      Resistance Training   Training Prescription Yes    Weight 3 lb    Reps 10-15          Perform Capillary Blood Glucose checks as needed.  Exercise Prescription Changes:   Exercise Prescription Changes     Row Name 04/05/24 1100 04/21/24 1600 05/05/24 1000 05/05/24 1600 05/18/24 1400     Response to Exercise   Blood Pressure (Admit) 140/84 132/64 128/72 -- 110/64   Blood Pressure (Exercise) 168/84 160/80  158/70 -- --   Blood Pressure (Exit) 138/76 132/76 136/74 -- 118/70   Heart Rate (Admit) 83 bpm 91 bpm 81 bpm -- 72 bpm   Heart Rate (Exercise) 116 bpm 131 bpm 129 bpm -- 126 bpm   Heart Rate (Exit) 95 bpm 79 bpm 92 bpm -- 89 bpm   Oxygen Saturation (Admit) 100 % -- -- -- 99 %   Oxygen Saturation (Exercise) 98 % -- -- -- 96 %   Oxygen Saturation (Exit) -- -- -- -- 97 %   Rating of Perceived Exertion (Exercise) 13 15 13  -- 13   Perceived Dyspnea (Exercise) 0 -- -- -- --   Symptoms L leg pain 5/10 -- none -- none   Comments Results 1st 2 weeks of exercise sessions -- -- --   Duration -- Progress to 30 minutes of  aerobic without signs/symptoms of physical distress Progress to 30 minutes of  aerobic without signs/symptoms of physical distress -- Progress to 30 minutes of  aerobic without signs/symptoms of physical distress   Intensity -- THRR unchanged THRR unchanged -- THRR unchanged     Progression   Progression -- Continue to progress workloads to maintain intensity without signs/symptoms of physical distress. Continue to progress workloads to maintain intensity without signs/symptoms of physical distress. -- Continue to progress workloads to maintain intensity without signs/symptoms of physical distress.   Average METs -- 2.69 2.9 -- 2.99     Resistance Training   Training Prescription -- Yes Yes -- Yes   Weight -- 3 lb 3 lb -- 3 lb   Reps -- 10-15 10-15 -- 10-15     Interval Training   Interval Training -- No No -- No     Recumbant Bike   Level -- 4 3 -- 4   Watts -- 12 25 -- 25   Minutes -- 15 15 -- 15   METs -- 3.5 2.89 -- 2.9     NuStep   Level -- 5 5 -- 4   Minutes -- 15 15 -- 15  METs -- 2.8 3 -- 4.4     T5 Nustep   Level -- -- -- -- 3   SPM -- -- -- -- 80   Minutes -- -- -- -- 15   METs -- -- -- -- 1.8     Rower   Level -- -- 6 -- --   Minutes -- -- 15 -- --   METs -- -- 28 -- --     Track   Laps -- 10  hallway 22 -- 23   Minutes -- 15 15 -- 15   METs  -- 1.54 2.2 -- 2.25     Home Exercise Plan   Plans to continue exercise at -- -- -- [employer] (comment)  [employer] for strength training with a personal trainer, [employer] for bike and PT [employer] (comment)  [employer] for strength training with a personal trainer, [employer] for bike and PT   Frequency -- -- -- Add 2 additional days to program exercise sessions. Add 2 additional days to program exercise sessions.   Initial Home Exercises Provided -- -- -- 05/05/24 05/05/24     Oxygen   Maintain Oxygen Saturation -- 88% or higher 88% or higher -- 88% or higher      Exercise Comments:   Exercise Comments     Row Name 04/07/24 0722           Exercise Comments First full day of exercise!  Patient was oriented to gym and equipment including functions, settings, policies, and procedures.  Patient's individual exercise prescription and treatment plan were reviewed.  All starting workloads were established based on the results of the 6 minute walk test done at initial orientation visit.  The plan for exercise progression was also introduced and progression will be customized based on patient's performance and goals.          Exercise Goals and Review:   Exercise Goals     Row Name 04/05/24 1105             Exercise Goals   Increase Physical Activity Yes       Intervention Develop an individualized exercise prescription for aerobic and resistive training based on initial evaluation findings, risk stratification, comorbidities and participant's personal goals.;Provide advice, education, support and counseling about physical activity/exercise needs.       Expected Outcomes Short Term: Attend rehab on a regular basis to increase amount of physical activity.;Long Term: Add in home exercise to make exercise part of routine and to increase amount of physical activity.;Long Term: Exercising regularly at least 3-5 days a week.       Increase Strength and Stamina  Yes       Intervention Develop an individualized exercise prescription for aerobic and resistive training based on initial evaluation findings, risk stratification, comorbidities and participant's personal goals.;Provide advice, education, support and counseling about physical activity/exercise needs.       Expected Outcomes Short Term: Increase workloads from initial exercise prescription for resistance, speed, and METs.;Long Term: Improve cardiorespiratory fitness, muscular endurance and strength as measured by increased METs and functional capacity ( );Short Term: Perform resistance training exercises routinely during rehab and add in resistance training at home       Able to understand and use rate of perceived exertion (RPE) scale Yes       Intervention Provide education and explanation on how to use RPE scale       Expected Outcomes Short Term: Able to use RPE daily  in rehab to express subjective intensity level;Long Term:  Able to use RPE to guide intensity level when exercising independently       Able to understand and use Dyspnea scale Yes       Intervention Provide education and explanation on how to use Dyspnea scale       Expected Outcomes Short Term: Able to use Dyspnea scale daily in rehab to express subjective sense of shortness of breath during exertion;Long Term: Able to use Dyspnea scale to guide intensity level when exercising independently       Knowledge and understanding of Target Heart Rate Range (THRR) Yes       Intervention Provide education and explanation of THRR including how the numbers were predicted and where they are located for reference       Expected Outcomes Short Term: Able to state/look up THRR;Long Term: Able to use THRR to govern intensity when exercising independently;Short Term: Able to use daily as guideline for intensity in rehab       Able to check pulse independently Yes       Intervention Provide education and demonstration on how to check pulse in  carotid and radial arteries.;Review the importance of being able to check your own pulse for safety during independent exercise       Expected Outcomes Short Term: Able to explain why pulse checking is important during independent exercise;Long Term: Able to check pulse independently and accurately       Understanding of Exercise Prescription Yes       Intervention Provide education, explanation, and written materials on patient's individual exercise prescription       Expected Outcomes Short Term: Able to explain program exercise prescription;Long Term: Able to explain home exercise prescription to exercise independently          Exercise Goals Re-Evaluation :  Exercise Goals Re-Evaluation     Row Name 04/07/24 9277 04/21/24 1617 05/05/24 1022 05/05/24 1621 05/18/24 1448     Exercise Goal Re-Evaluation   Exercise Goals Review Increase Physical Activity;Able to understand and use rate of perceived exertion (RPE) scale;Knowledge and understanding of Target Heart Rate Range (THRR);Understanding of Exercise Prescription;Increase Strength and Stamina;Able to understand and use Dyspnea scale;Able to check pulse independently Increase Physical Activity;Understanding of Exercise Prescription;Increase Strength and Stamina Increase Physical Activity;Understanding of Exercise Prescription;Increase Strength and Stamina Increase Physical Activity;Able to understand and use rate of perceived exertion (RPE) scale;Knowledge and understanding of Target Heart Rate Range (THRR);Understanding of Exercise Prescription;Increase Strength and Stamina;Able to understand and use Dyspnea scale;Able to check pulse independently Increase Physical Activity;Understanding of Exercise Prescription;Increase Strength and Stamina   Comments Reviewed RPE and dyspnea scale, THR and program prescription with pt today.  Pt voiced understanding and was given a copy of goals to take home. Demecia is off to a good start in the program and she  completed her first 2 weeks of exercise sessions in this review. She was able to walk 10 laps on the hallway track, work at level 4 on the recumbent bike, and work at level 5 on the T4 nustep. We will continue to monitor her progress in the program. Eustacia is doing well in rehab. She was recently able to increase her laps on the track from 10 to 22 laps, and was able to try the rowing machine at level 6. We will continue to monitor her progress in the program. Reviewed home exercise with pt today.  Pt plans to go to [employer] for strength  training with a personal trainer and [employer] for aerobic exercise on the bike and for PT for exercise. She plans to add 1-2 days a week at home.  Reviewed THR, pulse, RPE, sign and symptoms, pulse oximetery and when to call 911 or MD.  Also discussed weather considerations and indoor options.  Pt voiced understanding. Naela continues to do well in rehab. She will be due for her post in the next review period and she hopes to improve. She increased to level 4 on the recumbent bike. She also increased to 23 laps on the track. She maintained level 3 on the T5 nustep and decreased slightly to level 4 on the T4 nustep. We will continue to monitor her progress in the program.   Expected Outcomes Short: Use RPE daily to regulate intensity. Long: Follow program prescription in THR. Short: Continue to follow current exercise prescription. Long: Continue exercise to improve strength and stamina. Short: continue to increase track laps. Long: Continue exercise to improve strength and stamina. Short: Add 1-2 additional days of exercise at home. Long: Continue to exercise at home independently Short: Improve on post . Long: Continue to increase overall METs and stamina.      Discharge Exercise Prescription (Final Exercise Prescription Changes):  Exercise Prescription Changes - 05/18/24 1400       Response to Exercise   Blood Pressure (Admit) 110/64    Blood Pressure  (Exit) 118/70    Heart Rate (Admit) 72 bpm    Heart Rate (Exercise) 126 bpm    Heart Rate (Exit) 89 bpm    Oxygen Saturation (Admit) 99 %    Oxygen Saturation (Exercise) 96 %    Oxygen Saturation (Exit) 97 %    Rating of Perceived Exertion (Exercise) 13    Symptoms none    Duration Progress to 30 minutes of  aerobic without signs/symptoms of physical distress    Intensity THRR unchanged      Progression   Progression Continue to progress workloads to maintain intensity without signs/symptoms of physical distress.    Average METs 2.99      Resistance Training   Training Prescription Yes    Weight 3 lb    Reps 10-15      Interval Training   Interval Training No      Recumbant Bike   Level 4    Watts 25    Minutes 15    METs 2.9      NuStep   Level 4    Minutes 15    METs 4.4      T5 Nustep   Level 3    SPM 80    Minutes 15    METs 1.8      Track   Laps 23    Minutes 15    METs 2.25      Home Exercise Plan   Plans to continue exercise at [employer] (comment)   [employer] for strength training with a personal trainer, [employer] for bike and PT   Frequency Add 2 additional days to program exercise sessions.    Initial Home Exercises Provided 05/05/24      Oxygen   Maintain Oxygen Saturation 88% or higher          Nutrition:  Target Goals: Understanding of nutrition guidelines, daily intake of sodium 1500mg , cholesterol 200mg , calories 30% from fat and 7% or less from saturated fats, daily to have 5 or more servings of fruits and vegetables.  Education:  Nutrition 1 -Group instruction provided by verbal, written material, interactive activities, discussions, models, and posters to present general guidelines for heart healthy nutrition including macronutrients, label reading, and promoting whole foods over processed counterparts. Education serves as pensions consultant of discussion of heart healthy eating for all. Written material provided at class  time. Flowsheet Row Cardiac Rehab from 05/26/2024 in [facility] Cardiac and Pulmonary Rehab  Date 04/28/24  Educator jg  Instruction Review Code 1- Verbalizes Understanding     Education: Nutrition 2 -Group instruction provided by verbal, written material, interactive activities, discussions, models, and posters to present general guidelines for heart healthy nutrition including sodium, cholesterol, and saturated fat. Providing guidance of habit forming to improve blood pressure, cholesterol, and body weight. Written material provided at class time. Flowsheet Row Cardiac Rehab from 05/26/2024 in [facility] Cardiac and Pulmonary Rehab  Date 05/05/24  Educator jg  Instruction Review Code 1- Verbalizes Understanding      Biometrics:  Pre Biometrics - 04/05/24 1106       Pre Biometrics   Height 5' 4.5 (1.638 m)    Weight 203 lb 9.6 oz (92.4 kg)    Waist Circumference 43 inches    Hip Circumference 47 inches    Waist to Hip Ratio 0.91 %    BMI (Calculated) 34.42    Single Leg Stand 7.3 seconds          Post Biometrics - 05/26/24 0749        Post  Biometrics   Height 5' 4.5 (1.638 m)    Weight 189 lb 3.2 oz (85.8 kg)    Waist Circumference 39 inches    Hip Circumference 44.5 inches    Waist to Hip Ratio 0.88 %    BMI (Calculated) 31.99    Single Leg Stand 13.9 seconds          Nutrition Therapy Plan and Nutrition Goals:  Nutrition Therapy & Goals - 04/05/24 1308       Nutrition Therapy   Diet Cardiac, Low Na    Protein (specify units) 80    Fiber 25 grams    Whole Grain Foods 3 servings    Saturated Fats 12 max. grams    Fruits and Vegetables 5 servings/day    Sodium 1.5 grams      Personal Nutrition Goals   Nutrition Goal Eat 3 times per day, small frequent meals or nutrient dense snacks    Personal Goal #2 Eat 15-30gProtein and 30-60gCarbs at each meal.    Personal Goal #3 Read labels and reduce sodium intake to below 2300mg . Ideally 1500mg  per day.    Comments  Patient on zepbound  and says she has been seeing a RD at a weight loss program at [facility]. She eats small meals and says her appetite is reduced. Reminded her of the importance of eating smaller more frequent meals and snacks with nutrient dense foods. Focus on protein, with goal or ~80g per day. Encouraged her to include more healthy fats if her appetite prevents her from eating at least 1200kcal. Reviewed mediterranean diet handout. Educated on types of fats, sources, and how to read labels. Encouraged her to read labels and limit saturated fat to less than 12g per day and less than 1500mg  of sodium per day. Brainstormed several small meals and snacks with foods she likes and will eat.      Intervention Plan   Intervention Prescribe, educate and counsel regarding individualized specific dietary modifications aiming towards targeted core components such as weight, hypertension, lipid  management, diabetes, heart failure and other comorbidities.;Nutrition handout(s) given to patient.    Expected Outcomes Short Term Goal: Understand basic principles of dietary content, such as calories, fat, sodium, cholesterol and nutrients.;Short Term Goal: A plan has been developed with personal nutrition goals set during dietitian appointment.;Long Term Goal: Adherence to prescribed nutrition plan.          Nutrition Assessments:  MEDIFICTS Score Key: >=70 Need to make dietary changes  40-70 Heart Healthy Diet <= 40 Therapeutic Level Cholesterol Diet  Flowsheet Row Cardiac Rehab from 04/12/2024 in [facility] Cardiac and Pulmonary Rehab  Picture Your Plate Total Score on Admission 80   Picture Your Plate Scores: <59 Unhealthy dietary pattern with much room for improvement. 41-50 Dietary pattern unlikely to meet recommendations for good health and room for improvement. 51-60 More healthful dietary pattern, with some room for improvement.  >60 Healthy dietary pattern, although there may be some specific behaviors that  could be improved.    Nutrition Goals Re-Evaluation:   Nutrition Goals Discharge (Final Nutrition Goals Re-Evaluation):   Psychosocial: Target Goals: Acknowledge presence or absence of significant depression and/or stress, maximize coping skills, provide positive support system. Participant is able to verbalize types and ability to use techniques and skills needed for reducing stress and depression.   Education: Stress, Anxiety, and Depression - Group verbal and visual presentation to define topics covered.  Reviews how body is impacted by stress, anxiety, and depression.  Also discusses healthy ways to reduce stress and to treat/manage anxiety and depression. Written material provided at class time.   Education: Sleep Hygiene -Provides group verbal and written instruction about how sleep can affect your health.  Define sleep hygiene, discuss sleep cycles and impact of sleep habits. Review good sleep hygiene tips.   Initial Review & Psychosocial Screening:  Initial Psych Review & Screening - 03/29/24 1402       Initial Review   Current issues with None Identified      Family Dynamics   Good Support System? Yes    Comments Patient stated that she has a good support system of family and friends. She stated she does not have concerns with depression or stress and is out of work temporarily until she is fully recovered from heart event. Patient stated she is a [employer] and is interested in continuing to lose weight and is currently taking Zepbound . She stated she has lost about 30 pounds. Patient is interested in completing cardiac rehab to ensure positive health outcomes.      Barriers   Psychosocial barriers to participate in program There are no identifiable barriers or psychosocial needs.      Screening Interventions   Interventions Encouraged to exercise;Provide feedback about the scores to participant    Expected Outcomes Short Term goal: Identification and review with participant of  any Quality of Life or Depression concerns found by scoring the questionnaire.;Long Term goal: The participant improves quality of Life and PHQ9 Scores as seen by post scores and/or verbalization of changes          Quality of Life Scores:   Quality of Life - 04/12/24 0809       Quality of Life   Select Quality of Life      Quality of Life Scores   Health/Function Pre 24.32 %    Socioeconomic Pre 23.21 %    Psych/Spiritual Pre 23.79 %    Family Pre 23.63 %    GLOBAL Pre 23.88 %  Scores of 19 and below usually indicate a poorer quality of life in these areas.  A difference of  2-3 points is a clinically meaningful difference.  A difference of 2-3 points in the total score of the Quality of Life Index has been associated with significant improvement in overall quality of life, self-image, physical symptoms, and general health in studies assessing change in quality of life.  PHQ-9: Review Flowsheet       04/05/2024 08/16/2022 08/08/2021  Depression screen PHQ 2/9  Decreased Interest 0 0 0  Down, Depressed, Hopeless 0 0 0  PHQ - 2 Score 0 0 0  Altered sleeping 0 - -  Tired, decreased energy 0 - -  Change in appetite 0 - -  Feeling bad or failure about yourself  0 - -  Trouble concentrating 0 - -  Moving slowly or fidgety/restless 0 - -  Suicidal thoughts 0 - -  PHQ-9 Score 0  - -    Details       Data saved with a previous flowsheet row definition        Interpretation of Total Score  Total Score Depression Severity:  1-4 = Minimal depression, 5-9 = Mild depression, 10-14 = Moderate depression, 15-19 = Moderately severe depression, 20-27 = Severe depression   Psychosocial Evaluation and Intervention:  Psychosocial Evaluation - 03/29/24 1405       Psychosocial Evaluation & Interventions   Interventions Relaxation education;Encouraged to exercise with the program and follow exercise prescription;Stress management education    Comments Patient stated that she  has a good support system of family and friends. She stated she does not have concerns with depression or stress and is out of work temporarily until she is fully recovered from heart event. Patient stated she is a [employer] and is interested in continuing to lose weight and is currently taking Zepbound . She stated she has lost about 30 pounds. Patient is interested in completing cardiac rehab to ensure positive health outcomes.    Expected Outcomes ST: Attend cardiac rehab for education and exercise. LT: Develop and maintain positive self-care habits.    Continue Psychosocial Services  Follow up required by staff          Psychosocial Re-Evaluation:  Psychosocial Re-Evaluation     Row Name 05/19/24 [phone]             Psychosocial Re-Evaluation   Current issues with None Identified       Comments Patient reports no issues with their current mental states, sleep, stress, depression or anxiety. Will follow up with patient in a few weeks for any changes.       Expected Outcomes Short: Continue to exercise regularly to support mental health and notify staff of any changes. Long: maintain mental health and well being through teaching of rehab or prescribed medications independently.       Interventions Encouraged to attend Cardiac Rehabilitation for the exercise       Continue Psychosocial Services  Follow up required by staff          Psychosocial Discharge (Final Psychosocial Re-Evaluation):  Psychosocial Re-Evaluation - 05/19/24 0756       Psychosocial Re-Evaluation   Current issues with None Identified    Comments Patient reports no issues with their current mental states, sleep, stress, depression or anxiety. Will follow up with patient in a few weeks for any changes.    Expected Outcomes Short: Continue to exercise regularly to support mental health and notify staff of  any changes. Long: maintain mental health and well being through teaching of rehab or prescribed medications independently.     Interventions Encouraged to attend Cardiac Rehabilitation for the exercise    Continue Psychosocial Services  Follow up required by staff          Vocational Rehabilitation: Provide vocational rehab assistance to qualifying candidates.   Vocational Rehab Evaluation & Intervention:  Vocational Rehab - 03/29/24 1401       Initial Vocational Rehab Evaluation & Intervention   Assessment shows need for Vocational Rehabilitation No          Education: Education Goals: Education classes will be provided on a variety of topics geared toward better understanding of heart health and risk factor modification. Participant will state understanding/return demonstration of topics presented as noted by education test scores.  Learning Barriers/Preferences:  Learning Barriers/Preferences - 03/29/24 1401       Learning Barriers/Preferences   Learning Barriers None    Learning Preferences Computer/Internet;Skilled Demonstration;Pictoral;Video;Written Material          General Cardiac Education Topics:  AED/CPR: - Group verbal and written instruction with the use of models to demonstrate the basic use of the AED with the basic ABC's of resuscitation.   Test and Procedures: - Group verbal and visual presentation and models provide information about basic cardiac anatomy and function. Reviews the testing methods done to diagnose heart disease and the outcomes of the test results. Describes the treatment choices: Medical Management, Angioplasty, or Coronary Bypass Surgery for treating various heart conditions including Myocardial Infarction, Angina, Valve Disease, and Cardiac Arrhythmias. Written material provided at class time. Flowsheet Row Cardiac Rehab from 05/26/2024 in [facility] Cardiac and Pulmonary Rehab  Date 05/12/24  Educator lc  Instruction Review Code 1- Verbalizes Understanding    Medication Safety: - Group verbal and visual instruction to review commonly prescribed  medications for heart and lung disease. Reviews the medication, class of the drug, and side effects. Includes the steps to properly store meds and maintain the prescription regimen. Written material provided at class time. Flowsheet Row Cardiac Rehab from 05/26/2024 in [facility] Cardiac and Pulmonary Rehab  Date 05/19/24  Educator kb  Instruction Review Code 1- Verbalizes Understanding    Intimacy: - Group verbal instruction through game format to discuss how heart and lung disease can affect sexual intimacy. Written material provided at class time. Flowsheet Row Cardiac Rehab from 05/26/2024 in [facility] Cardiac and Pulmonary Rehab  Date 04/21/24  Educator nt  Instruction Review Code 1- Tefl Teacher Understanding    Know Your Numbers and Heart Failure: - Group verbal and visual instruction to discuss disease risk factors for cardiac and pulmonary disease and treatment options.  Reviews associated critical values for Overweight/Obesity, Hypertension, Cholesterol, and Diabetes.  Discusses basics of heart failure: signs/symptoms and treatments.  Introduces Heart Failure Zone chart for action plan for heart failure. Written material provided at class time.   Infection Prevention: - Provides verbal and written material to individual with discussion of infection control including proper hand washing and proper equipment cleaning during exercise session. Flowsheet Row Cardiac Rehab from 05/26/2024 in [facility] Cardiac and Pulmonary Rehab  Date 04/05/24  Educator NT  Instruction Review Code 1- Verbalizes Understanding    Falls Prevention: - Provides verbal and written material to individual with discussion of falls prevention and safety. Flowsheet Row Cardiac Rehab from 05/26/2024 in [facility] Cardiac and Pulmonary Rehab  Date 03/29/24  Educator kb  Instruction Review Code 1- Verbalizes Understanding    Other: -  Provides group and verbal instruction on various topics (see comments)   Knowledge Questionnaire  Score:  Knowledge Questionnaire Score - 04/12/24 0806       Knowledge Questionnaire Score   Pre Score 26/26          Core Components/Risk Factors/Patient Goals at Admission:  Personal Goals and Risk Factors at Admission - 03/29/24 1401       Core Components/Risk Factors/Patient Goals on Admission    Weight Management Weight Loss    Hypertension Yes    Intervention Provide education on lifestyle modifcations including regular physical activity/exercise, weight management, moderate sodium restriction and increased consumption of fresh fruit, vegetables, and low fat dairy, alcohol moderation, and smoking cessation.    Expected Outcomes Long Term: Maintenance of blood pressure at goal levels.;Short Term: Continued assessment and intervention until BP is < 140/42mm HG in hypertensive participants. < 130/18mm HG in hypertensive participants with diabetes, heart failure or chronic kidney disease.    Lipids Yes    Intervention Provide education and support for participant on nutrition & aerobic/resistive exercise along with prescribed medications to achieve LDL 70mg , HDL >40mg .    Expected Outcomes Short Term: Participant states understanding of desired cholesterol values and is compliant with medications prescribed. Participant is following exercise prescription and nutrition guidelines.;Long Term: Cholesterol controlled with medications as prescribed, with individualized exercise RX and with personalized nutrition plan. Value goals: LDL < 70mg , HDL > 40 mg.          Education:Diabetes - Individual verbal and written instruction to review signs/symptoms of diabetes, desired ranges of glucose level fasting, after meals and with exercise. Acknowledge that pre and post exercise glucose checks will be done for 3 sessions at entry of program.   Core Components/Risk Factors/Patient Goals Review:   Goals and Risk Factor Review     Row Name 05/19/24 0758             Core Components/Risk  Factors/Patient Goals Review   Personal Goals Review Weight Management/Obesity       Review Pegah states she has a blood pressure cuff to check her blood pressure at home. She wants to lose a little weight. She is going to have a conversation with her cardiologist to see where she needs to be.       Expected Outcomes Short: lose a few pounds in the next couple weeks. Long: reach weight goal.          Core Components/Risk Factors/Patient Goals at Discharge (Final Review):   Goals and Risk Factor Review - 05/19/24 0758       Core Components/Risk Factors/Patient Goals Review   Personal Goals Review Weight Management/Obesity    Review Jerriyah states she has a blood pressure cuff to check her blood pressure at home. She wants to lose a little weight. She is going to have a conversation with her cardiologist to see where she needs to be.    Expected Outcomes Short: lose a few pounds in the next couple weeks. Long: reach weight goal.          ITP Comments:  ITP Comments     Row Name 03/29/24 1407 04/05/24 1022 04/07/24 0722 04/28/24 0810 05/26/24 0925   ITP Comments Initial phone call completed. Diagnosis can be found in [facility] 02/19/2024. EP Orientation scheduled for Monday, April 05, 2024 @ 0900. Completed and gym orientation for cardiac rehab. Initial ITP created and sent for review to Dr. Oneil Pinal, Medical Director. First full day of exercise!  Patient was oriented to gym and equipment including functions, settings, policies, and procedures.  Patient's individual exercise prescription and treatment plan were reviewed.  All starting workloads were established based on the results of the 6 minute walk test done at initial orientation visit.  The plan for exercise progression was also introduced and progression will be customized based on patient's performance and goals. 30 Day review completed. Medical Director ITP review done, changes made as directed, and signed approval by Medical  Director. New to program. 30 Day review completed. Medical Director ITP review done, changes made as directed, and signed approval by Medical Director. New to program.      Comments: 30 day review ITP

## 2024-05-26 NOTE — Progress Notes (Signed)
 Daily Session Note  Patient Details  Name: Debbie [name] MRN: [id number] Date of Birth: [birth year] Referring Provider:   Flowsheet Row Cardiac Rehab from 04/05/2024 in [facility] Cardiac and Pulmonary Rehab  Referring Provider Dr. Redell Cave, MD    Encounter Date: 05/26/2024  Check In:  Session Check In - 05/26/24 0833       Check-In   Supervising physician immediately available to respond to emergencies See telemetry face sheet for immediately available ER MD    Location [facility]-Cardiac & Pulmonary Rehab    Staff Present Devaughn Jaeger, BS, Exercise Physiologist;Margaret Best, MS, Exercise Physiologist;Joseph Rolinda NORWOOD HARMAN Tsosie Peggi, RN, DNP, NE-BC;Kelly Bollinger RN,BSN,MPA    Virtual Visit No    Medication changes reported     No    Fall or balance concerns reported    No    Warm-up and Cool-down Performed on first and last piece of equipment    Resistance Training Performed Yes    VAD Patient? No    PAD/SET Patient? No      Pain Assessment   Currently in Pain? No/denies             Social History   Tobacco Use  Smoking Status Never  Smokeless Tobacco Never    Goals Met:  Proper associated with RPD/PD & O2 Sat Independence with exercise equipment Exercise tolerated well No report of concerns or symptoms today Strength training completed today  Goals Unmet:  Not Applicable  Comments: Pt able to follow exercise prescription today without complaint.  Will continue to monitor for progression.    Dr. Oneil Pinal is Medical Director for [facility] Cardiac Rehabilitation.  Dr. Fuad Aleskerov is Medical Director for [facility] Pulmonary Rehabilitation.

## 2024-05-27 ENCOUNTER — Ambulatory Visit (INDEPENDENT_AMBULATORY_CARE_PROVIDER_SITE_OTHER): Admitting: Physical Therapy

## 2024-05-27 ENCOUNTER — Other Ambulatory Visit: Payer: Self-pay

## 2024-05-27 ENCOUNTER — Encounter: Payer: Self-pay | Admitting: Physical Therapy

## 2024-05-27 ENCOUNTER — Encounter: Payer: Self-pay | Admitting: Cardiology

## 2024-05-27 DIAGNOSIS — M25552 Pain in left hip: Secondary | ICD-10-CM | POA: Diagnosis not present

## 2024-05-27 DIAGNOSIS — M5459 Other low back pain: Secondary | ICD-10-CM | POA: Diagnosis not present

## 2024-05-27 DIAGNOSIS — M79652 Pain in left thigh: Secondary | ICD-10-CM | POA: Diagnosis not present

## 2024-05-27 DIAGNOSIS — G8929 Other chronic pain: Secondary | ICD-10-CM

## 2024-05-27 DIAGNOSIS — M25562 Pain in left knee: Secondary | ICD-10-CM

## 2024-05-27 DIAGNOSIS — M6281 Muscle weakness (generalized): Secondary | ICD-10-CM

## 2024-05-27 NOTE — Therapy (Signed)
 OUTPATIENT PHYSICAL THERAPY TREATMENT   Patient Name: Debbie [name] MRN: [id number] DOB:1956/03/27, 68 y.o., female Today's Date: 05/27/2024   END OF SESSION:  PT End of Session - 05/27/24 0903     Visit Number 5    Number of Visits 16    Date for Recertification  07/06/24    Authorization Type BCBS    PT Start Time [phone]    PT Stop Time 0938    PT Time Calculation (min) 43 min    Activity Tolerance Patient tolerated treatment well    Behavior During Therapy [facility] for tasks assessed/performed              Past Medical History:  Diagnosis Date   Genital warts    Hyperlipidemia    Hypertension    Myocardial infarction [facility])    Peripheral arterial disease    Status post angioplasty with stent    Known femoral stent occlusion   Past Surgical History:  Procedure Laterality Date   ABDOMINAL AORTAGRAM N/A 08/09/2011   Procedure: ABDOMINAL EZELLA;  Surgeon: Carlin FORBES Haddock, MD;  Location: [facility] CATH LAB;  Service: Cardiovascular;  Laterality: N/A;   ANGIOPLASTY / STENTING FEMORAL  2008   Right femoral artery stenting done in [place]    COLONOSCOPY WITH PROPOFOL  N/A 06/11/2021   Procedure: COLONOSCOPY WITH PROPOFOL ;  Surgeon: Therisa Bi, MD;  Location: [facility] ENDOSCOPY;  Service: Gastroenterology;  Laterality: N/A;  2ND ARRIVAL, PLEASE   FEMORAL BYPASS Right 08/2011   Dr. Sherre at [facility]   Patient Active Problem List   Diagnosis Date Noted   Syncope and collapse 02/19/2024   Lumbar radiculopathy 01/07/2024   Colon cancer (HCC) 08/14/2021   Hypertension 11/28/2017   Vitamin D  deficiency 05/13/2012   Hypomagnesemia 11/11/2011   Hyperlipidemia 09/06/2011   Prediabetes 09/06/2011   PAD (peripheral artery disease) 08/01/2011   Pain in limb 08/01/2011   S/P angioplasty with stent 08/01/2011    PCP: Eliverto Bette Hover, MD  REFERRING PROVIDER: Joane Artist RAMAN, MD  REFERRING DIAG: Lumbar radiculopathy  Rationale for Evaluation and Treatment: Rehabilitation  THERAPY DIAG:   Other low back pain  Pain in left hip  Pain in left thigh  Chronic pain of left knee  Muscle weakness (generalized)  ONSET DATE: Chronic   SUBJECTIVE:          SUBJECTIVE STATEMENT: Patient reports she hurt for a few hours after last visit but today she is feeling fine. States she feels great right now.   Eval: Patient reports chronic left hip and thigh pain, then in July she had a heart attack. She was discharged in August, and then when she returned home her left hip and knee were bothering her still. She does get some pain in her lower back with lifting heavy objects. The pain can radiate down the left thigh to the knee. The pain in the hip will bother her if she sleeps on her left side. She has been seeing a PT previously working on the left hip. She does use a walking stick occasionally if the left leg flares up. She does feel like when she walks she compensates so will use the walking stick. She does feel like she has made progress regarding the left hip because she has been walking with cardiac rehab and this has gotten much better. She continues to be limited primarily by the pain in the left thigh with walking and standing, and feels like she has to take pressure off the left leg. She does  do personal training 3x/week that focuses on her core, gait, and posture.  She is participating in cardiac rehab 3x/week and was seeing PT 2-3x/week.  PERTINENT HISTORY:  MI in June 2025  PAIN:  Are you having pain? Yes:  NPRS scale: 0/10 currently, 4-5/10 when pain occurs Pain location: Left thigh to the knee Pain description: Discomfort Aggravating factors: Walking, standing Relieving factors: Rest, stretching, exercise, using walking stick  PRECAUTIONS: Patient is currently participating in cardiac rehab for recent MI  PATIENT GOALS: Pain relief, improving walking and standing tolerance   OBJECTIVE:  Note: Objective measures were completed at Evaluation unless otherwise  noted. PATIENT SURVEYS:  PSFS: 5 Walking 22 laps on track / begin walking on treadmill without pain in left thigh/knee: 4 Standing for long periods due to left thigh pain: 5 Heavy lifting due to lower back: 6  MUSCLE LENGTH: Limitations with bilateral hamstring and hip flexor/quad  POSTURE:   Rounded shoulder posture  PALPATION: Tender to palpation lumbar with hypomobility noted with lumbar CPAs  LUMBAR ROM:   AROM eval  Flexion WFL  Extension 50%  Right lateral flexion   Left lateral flexion   Right rotation 75%  Left rotation 75%   (Blank rows = not tested)  LOWER EXTREMITY ROM:      Hip PROM grossly WFL and non-painful  LOWER EXTREMITY MMT:    MMT Right eval Left eval  Hip flexion 4- 4-  Hip extension 4- 4-  Hip abduction 3+ 3  Hip adduction    Hip internal rotation    Hip external rotation    Knee flexion 5 5  Knee extension 5 5  Ankle dorsiflexion    Ankle plantarflexion    Ankle inversion    Ankle eversion     (Blank rows = not tested)  FUNCTIONAL TESTS:  30 sec stand test: 11 reps  GAIT: Assistive device utilized: None Level of assistance: Complete Independence Comments: Trendelenburg on left   TREATMENT  [facility] Adult PT Treatment:                                                DATE: 05/27/2024 Seated hamstring stretch 3 x 20 sec each Slant board calf stretch 3 x 30 sec Prone quad stretch 3 x 20 sec each Side clamshell with green 3 x 15 left LAQ with 7.5# 3 x 12 left Goblet squat with 15# 3 x 12  PATIENT EDUCATION:  Education details: HEP Person educated: Patient Education method: Programmer, Multimedia, Demonstration, Tactile cues, Verbal cues Education comprehension: verbalized understanding, returned demonstration, verbal cues required, tactile cues required, and needs further education  HOME EXERCISE PROGRAM: Access Code: [email or web address]   ASSESSMENT: CLINICAL IMPRESSION: Patient tolerated therapy well with no adverse effects. Therapy focused on  improving her flexibility and progressing strength of the left hip and quad primarily. She does report feeling like she has atrophy of the left quad and weakness compared to the right. She tolerated exercises well and discussed need to progress resistance for strengthening exercises and ensuring adequate diet to promote muscle mass gains. No changes made to her HEP. Patient would benefit from continued skilled PT to progress mobility and strength in order to reduce pain and maximize functional ability.   Eval: Patient is a 68 y.o. female who was seen today for physical therapy evaluation and treatment for chronic left thigh and  knee pain that do seem consistent with left lumbar radicular pain, but she also reports pain consistent with left GTPS and left knee pain. She does exhibit mobility deficit of the lumbar spine and gross strength deficit of the core and hip region. Extensive time spent discussing her current exercise and rehab routine as noted above. Patient demonstrated current core and hip exercises and provided patient quadruped pelvic tilt exercises to work of core neuromuscular control. She did report some lower back discomfort with supine core exercises likely due to weakness and mobility deficit, and reports left hip and thigh pain with hip abductor strengthening exercises. Patient would benefit from continued skilled PT to progress mobility and strength in order to reduce pain and maximize functional ability.  OBJECTIVE IMPAIRMENTS: Abnormal gait, decreased activity tolerance, decreased balance, decreased ROM, decreased strength, hypomobility, impaired flexibility, postural dysfunction, and pain.   ACTIVITY LIMITATIONS: lifting, standing, sleeping, and locomotion level  PARTICIPATION LIMITATIONS: meal prep, cleaning, shopping, and community activity  PERSONAL FACTORS: Fitness, Past/current experiences, and Time since onset of injury/illness/exacerbation are also affecting patient's  functional outcome.    GOALS: Goals reviewed with patient? Yes  SHORT TERM GOALS: Target date: 06/08/2024  Patient will be I with initial HEP in order to progress with therapy. Baseline: HEP provided at eval Goal status: INITIAL  2.  Patient will report left thigh, hip, knee pain </= 2/10 in order to reduce functional limitations and improve walking tolerance Baseline: 4-5/10 Goal status: INITIAL  LONG TERM GOALS: Target date: 07/06/2024  Patient will be I with final HEP to maintain progress from PT. Baseline: HEP provided at eval Goal status: INITIAL  2.  Patient will report PSFS >/= 8 in order to indicate improvement in their functional ability. Baseline: 5 Goal status: INITIAL  3.  Patient will demonstrate gross hip strength >/= 4/5 MMT to improve her walking and standing tolerance Baseline: see limitations above Goal status: INITIAL  4.  Patient will perform 30 sec stand test >/= 15 reps to indicate improved strength and endurance for better walking and standing tolerance Baseline: 11 reps Goal status: INITIAL   PLAN: PT FREQUENCY: 1-2x/week  PT DURATION: 8 weeks  PLANNED INTERVENTIONS: 97164- PT Re-evaluation, 97750- Physical Performance Testing, 97110-Therapeutic exercises, 97530- Therapeutic activity, 97112- Neuromuscular re-education, 97535- Self Care, 02859- Manual therapy, [id number]- Gait training, [phone] (1-2 muscles), 20561 (3+ muscles)- Dry Needling, Patient/Family education, Balance training, Joint mobilization, Joint manipulation, Spinal manipulation, Spinal mobilization, Cryotherapy, and Moist heat.  PLAN FOR NEXT SESSION: Review HEP and progress PRN, manual for lumbar mobility, progress lumbar motion and stretching, progress hip and core strengthening/stabilization   Elaine Daring, PT, DPT, LAT, ATC 05/27/24  10:18 AM Phone: [phone] Fax: [phone]

## 2024-05-28 ENCOUNTER — Encounter

## 2024-05-28 DIAGNOSIS — I213 ST elevation (STEMI) myocardial infarction of unspecified site: Secondary | ICD-10-CM

## 2024-05-28 DIAGNOSIS — Z955 Presence of coronary angioplasty implant and graft: Secondary | ICD-10-CM | POA: Diagnosis not present

## 2024-05-28 NOTE — Progress Notes (Signed)
 Daily Session Note  Patient Details  Name: Debbie [name] MRN: [id number] Date of Birth: 05-29-1956 Referring Provider:   Flowsheet Row Cardiac Rehab from 04/05/2024 in [facility] Cardiac and Pulmonary Rehab  Referring Provider Dr. Redell Cave, MD    Encounter Date: 05/28/2024  Check In:  Session Check In - 05/28/24 0727       Check-In   Supervising physician immediately available to respond to emergencies See telemetry face sheet for immediately available ER MD    Location [facility]-Cardiac & Pulmonary Rehab    Staff Present Burnard Davenport RN,BSN,MPA;Joseph Rolinda RCP,RRT,BSRT;Noah Tickle, [place], Exercise Physiologist    Virtual Visit No    Medication changes reported     No    Fall or balance concerns reported    No    Tobacco Cessation No Change    Warm-up and Cool-down Performed on first and last piece of equipment    Resistance Training Performed Yes    VAD Patient? No    PAD/SET Patient? No      Pain Assessment   Currently in Pain? No/denies             Social History   Tobacco Use  Smoking Status Never  Smokeless Tobacco Never    Goals Met:  Proper associated with RPD/PD & O2 Sat Independence with exercise equipment Exercise tolerated well No report of concerns or symptoms today Strength training completed today  Goals Unmet:  Not Applicable  Comments: Pt able to follow exercise prescription today without complaint.  Will continue to monitor for progression.    Dr. Oneil Pinal is Medical Director for [facility] Cardiac Rehabilitation.  Dr. Fuad Aleskerov is Medical Director for [facility] Pulmonary Rehabilitation.

## 2024-05-31 ENCOUNTER — Encounter

## 2024-05-31 ENCOUNTER — Encounter: Payer: Self-pay | Admitting: Physical Therapy

## 2024-05-31 ENCOUNTER — Ambulatory Visit: Admitting: Clinical

## 2024-05-31 DIAGNOSIS — I213 ST elevation (STEMI) myocardial infarction of unspecified site: Secondary | ICD-10-CM

## 2024-05-31 DIAGNOSIS — Z955 Presence of coronary angioplasty implant and graft: Secondary | ICD-10-CM

## 2024-05-31 DIAGNOSIS — F4320 Adjustment disorder, unspecified: Secondary | ICD-10-CM | POA: Diagnosis not present

## 2024-05-31 NOTE — Progress Notes (Signed)
   Darice Seats, LCSW

## 2024-05-31 NOTE — Progress Notes (Signed)
 [facility] Counselor/Therapist Progress Note  Patient ID: De Libman, MRN: [id number],    Date: 05/31/2024  Time Spent: 2:32pm - 3:28pm : 56 minutes   Treatment Type: Individual Therapy  Reported Symptoms: none reported  Mental Status Exam: Appearance:  Neat and Well Groomed     Behavior: Appropriate  Motor: Normal  Speech/Language:  Clear and Coherent and Normal Rate  Affect: Appropriate  Mood: normal  Thought process: normal  Thought content:   WNL  Sensory/Perceptual disturbances:   WNL  Orientation: oriented to person, place, time/date, and situation  Attention: Good  Concentration: Good  Memory: WNL  Fund of knowledge:  Good  Insight:   Good  Judgment:  Good  Impulse Control: Good   Risk Assessment: Danger to Self:  No Patient denied current suicidal ideation  Self-injurious Behavior: No Danger to Others: No Patient denied current homicidal ideation Duty to Warn:no Physical Aggression / Violence:No  Access to Firearms a concern: No  Gang Involvement:No   Subjective: Patient reported no changes since last session. Patient reported patient continues to participate in cardiac rehab and physical therapy. Patient reported patient will complete cardiac rehab next Wednesday and stated, I will feel good, it's an accomplishment. Patient stated, I've been feeling fine and stated, I haven't had any episodes of feeling sad or depressed in response to mood since last session.  Patient reported upcoming appointment with cardiologist and plans to request an extension in patient's short term disability.  Patient reported if patient returns to work full time patient would like to meet with leadership prior to returning to work and stated, we can mutually agree upon a schedule. 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 Patient reported the following professional supports: senior warden, pastoral care team, worship team, clerical support. Patient reported feeling patient will need to establish boundaries upon returning to work. 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 Patient stated, I think I get it now, it was a hard way to get it in reference to importance of self care. Patient reported patient ends conversations with family when patient feels the conversation is no longer productive.   Interventions: Cognitive Behavioral Therapy. Clinician conducted session via caregility video from clinician's home office. Patient provided verbal consent to proceed with telehealth session and is aware of limitations of telephone or video visits. Patient participated in session from patient's home. Reviewed events since last session and assessed for changes. Discussed agenda for session. Discussed patient's thoughts/feelings regarding patient's return to work. Reviewed professional expectations as it relates to patient's work related responsibilities. Explored patient's professional support system. Discussed strategies to establish and maintain boundaries upon returning to work, such as, refraining from answering phone during lunch or requesting church secretary refrain from calling patient during lunch. Explored additional boundaries patient would like to establish when returning to work. Provided psycho education related to self care.    Collaboration of Care: Other not required at this time   Diagnosis:  Adjustment disorder, unspecified type     Plan: Patient is to utilize Cognitive Behavioral Therapy, effective communication, and coping strategies to decrease symptoms associated with their diagnosis. Frequency: bi-weekly  Modality: individual      Long-term goal:   Increase use of self care strategies from 5 days  per week to 7 days per week  Target Date: 10/03/24  Progress: progressing    Practice effective communication strategies for patient to utilize when expressing her thoughts and feelings to others and establishing boundaries in a controlled and assertive way  Target Date: 10/03/24  Progress: progressing    Short-term goal:  Verbally express an understanding of the relationship between concern/worry related to patient's recent medical symptoms/events and their impact on thinking patterns and behaviors. Target Date: 10/03/24  Progress: progressing                          Darice Seats, LCSW

## 2024-05-31 NOTE — Progress Notes (Signed)
 Daily Session Note  Patient Details  Name: Debbie [name] MRN: [id number] Date of Birth: Sep 10, 1955 Referring Provider:   Flowsheet Row Cardiac Rehab from 04/05/2024 in [facility] Cardiac and Pulmonary Rehab  Referring Provider Dr. Redell Cave, MD    Encounter Date: 05/31/2024  Check In:  Session Check In - 05/31/24 0831       Check-In   Supervising physician immediately available to respond to emergencies See telemetry face sheet for immediately available ER MD    Location [facility]-Cardiac & Pulmonary Rehab    Staff Present Burnard Davenport RN,BSN,MPA;[facility] Dyane BS, ACSM CEP, Exercise Physiologist;Jason Elnor RDN,LDN    Virtual Visit No    Medication changes reported     No    Fall or balance concerns reported    No    Tobacco Cessation No Change    Warm-up and Cool-down Performed on first and last piece of equipment    Resistance Training Performed Yes    VAD Patient? No    PAD/SET Patient? No      Pain Assessment   Currently in Pain? No/denies             Social History   Tobacco Use  Smoking Status Never  Smokeless Tobacco Never    Goals Met:  Proper associated with RPD/PD & O2 Sat Independence with exercise equipment Exercise tolerated well No report of concerns or symptoms today Strength training completed today  Goals Unmet:  Not Applicable  Comments: Pt able to follow exercise prescription today without complaint.  Will continue to monitor for progression.    Dr. Oneil Pinal is Medical Director for [facility] Cardiac Rehabilitation.  Dr. Fuad Aleskerov is Medical Director for [facility] Pulmonary Rehabilitation.

## 2024-06-01 ENCOUNTER — Encounter: Admitting: Physical Therapy

## 2024-06-02 ENCOUNTER — Encounter: Payer: Self-pay | Admitting: Cardiology

## 2024-06-02 ENCOUNTER — Encounter

## 2024-06-02 ENCOUNTER — Ambulatory Visit: Attending: Cardiology | Admitting: Cardiology

## 2024-06-02 VITALS — BP 130/64 | HR 81 | Ht 65.5 in | Wt 190.6 lb

## 2024-06-02 DIAGNOSIS — I2510 Atherosclerotic heart disease of native coronary artery without angina pectoris: Secondary | ICD-10-CM | POA: Diagnosis not present

## 2024-06-02 DIAGNOSIS — E7800 Pure hypercholesterolemia, unspecified: Secondary | ICD-10-CM | POA: Diagnosis not present

## 2024-06-02 DIAGNOSIS — Z955 Presence of coronary angioplasty implant and graft: Secondary | ICD-10-CM

## 2024-06-02 DIAGNOSIS — I10 Essential (primary) hypertension: Secondary | ICD-10-CM

## 2024-06-02 DIAGNOSIS — I213 ST elevation (STEMI) myocardial infarction of unspecified site: Secondary | ICD-10-CM

## 2024-06-02 NOTE — Patient Instructions (Signed)
 Medication Instructions:  Your physician recommends that you continue on your current medications as directed. Please refer to the Current Medication list given to you today.   *If you need a refill on your cardiac medications before your next appointment, please call your pharmacy*  Lab Work: Your provider would like for you to return in on Monday to have the following labs drawn: lipid panel.   Please go to [facility] [address] ([facility], [place] [address] You do not need an appointment.  They are open from 8 am- 4:30 pm.  Lunch from 1:00 pm- 2:00 pm You DO need to be fasting.   You may also go to one of the following [facility]:  [address] [place], [place] [address] Phone: [phone] Lab hours: Mon-Fri 8 am- 5 pm    Lunch 12 pm- 1 pm  [address] [place],  [place]  [address]  [place]  Phone: [phone] Lab hours: 7 am- 4 pm Lunch 12 pm-1 pm   [address] [place],  [place]  [address]  [place]  Phone: [phone] Lab hours: Mon-Fri 8 am- 5 pm    Lunch 12 pm- 1 pm  If you have labs (blood work) drawn today and your tests are completely normal, you will receive your results only by: MyChart Message (if you have MyChart) OR A paper copy in the mail If you have any lab test that is abnormal or we need to change your treatment, we will call you to review the results.  Testing/Procedures: No test ordered today   Follow-Up: At [facility], you and your health needs are our priority.  As part of our continuing mission to provide you with exceptional heart care, our providers are all part of one team.  This team includes your primary Cardiologist (physician) and Advanced Practice Providers or APPs (Physician Assistants and Nurse Practitioners) who all work together to provide you with the care you need, when you need it.  Your next appointment:   6 month(s)  Provider:   You may see Dr Darliss or one of the following Advanced  Practice Providers on your designated Care Team:   Lonni Meager, NP Lesley Maffucci, PA-C Bernardino Bring, PA-C Cadence No Name, PA-C Tylene Lunch, NP Barnie Hila, NP    We recommend signing up for the patient portal called MyChart.  Sign up information is provided on this After Visit Summary.  MyChart is used to connect with patients for Virtual Visits (Telemedicine).  Patients are able to view lab/test results, encounter notes, upcoming appointments, etc.  Non-urgent messages can be sent to your provider as well.   To learn more about what you can do with MyChart, go to [email or web address].

## 2024-06-02 NOTE — Progress Notes (Signed)
 Cardiology Office Note:    Date:  06/02/2024   ID:  Debbie [name], DOB 05/11/1956, MRN [id number]  PCP:  Eliverto Bette Hover, MD   [facility] Providers Cardiologist:  None     Referring MD: Eliverto Bette Hover, *   Chief Complaint  Patient presents with   Follow-up    3 month follow up pt has been doing well with no complaints of chest pain, chest pressure or SOB, medciation reviewed verbally with patient    History of Present Illness:    Debbie [name] is a 68 y.o. female with a hx of STEMI s/p PCI/DES x2 (LCx, OM1, PBA of OM2 on 7/25), hypertension, hyperlipidemia, PAD (s/p right femoral stent 2008, femoral bypass 2013) who presents for follow-up.  Has been doing well, compliant with aspirin , Brilinta , Crestor  as prescribed.  Denies chest pain.  Currently undergoing cardiac rehab with no setbacks.  Plans to complete cardiac rehab next month.  Has a history of left hip bursitis, has occasional radiating pain down her left thigh.  Follows up with primary care physician/sports medicine for this.  Does not want injections or surgery.  Hoping to return to work in January.  Tolerated all medications as prescribed.  Prior notes/testing Echo 02/20/2024 showed EF 60 to 65%.     Past Medical History:  Diagnosis Date   Genital warts    Hyperlipidemia    Hypertension    Myocardial infarction [facility])    Peripheral arterial disease    Status post angioplasty with stent    Known femoral stent occlusion    Past Surgical History:  Procedure Laterality Date   ABDOMINAL AORTAGRAM N/A 08/09/2011   Procedure: ABDOMINAL EZELLA;  Surgeon: Carlin FORBES Haddock, MD;  Location: [facility] CATH LAB;  Service: Cardiovascular;  Laterality: N/A;   ANGIOPLASTY / STENTING FEMORAL  2008   Right femoral artery stenting done in [place]    COLONOSCOPY WITH PROPOFOL  N/A 06/11/2021   Procedure: COLONOSCOPY WITH PROPOFOL ;  Surgeon: Therisa Bi, MD;  Location: [facility] ENDOSCOPY;  Service: Gastroenterology;   Laterality: N/A;  2ND ARRIVAL, PLEASE   FEMORAL BYPASS Right 08/2011   Dr. Sherre at [facility]    Current Medications: Current Meds  Medication Sig   aspirin  EC 81 MG tablet Take 1 tablet (81 mg total) by mouth daily.   docusate sodium  (COLACE) 100 MG capsule Take 100 mg by mouth 2 (two) times daily.   metoprolol  succinate (TOPROL -XL) 50 MG 24 hr tablet Take 1 tablet (50 mg total) by mouth daily.   mupirocin ointment (BACTROBAN) 2 % Apply 1 Application topically 2 (two) times daily.   rosuvastatin  (CRESTOR ) 40 MG tablet Take 1 tablet (40 mg total) by mouth at bedtime.   ticagrelor  (BRILINTA ) 90 MG TABS tablet Take 1 tablet (90 mg total) by mouth 2 (two) times daily.   tirzepatide  (ZEPBOUND ) 15 MG/0.5ML Pen Inject 15 mg into the skin once a week.     Allergies:   Sulfa antibiotics   Social History   Socioeconomic History   Marital status: Single    Spouse name: Not on file   Number of children: Not on file   Years of education: Not on file   Highest education level: Not on file  Occupational History   Not on file  Tobacco Use   Smoking status: Never   Smokeless tobacco: Never  Vaping Use   Vaping status: Never Used  Substance and Sexual Activity   Alcohol use: No   Drug use: No   Sexual  activity: Not Currently    Birth control/protection: Post-menopausal  Other Topics Concern   Not on file  Social History Narrative   Living w/ alone, 1 story home 3 steps   Coffee 1 cup today   Social Drivers of Health   Financial Resource Strain: Low Risk  (06/02/2023)   Received from [facility]   Overall Financial Resource Strain (CARDIA)    Difficulty of Paying Living Expenses: Not hard at all  Food Insecurity: Food Insecurity Present (06/02/2023)   Received from [facility]   Hunger Vital Sign    Within the past 12 months, you worried that your food would run out before you got the money to buy more.: Never true    Within the past 12 months, the  food you bought just didn't last and you didn't have money to get more.: Sometimes true  Transportation Needs: No Transportation Needs (06/02/2023)   Received from [facility] - Transportation    In the past 12 months, has lack of transportation kept you from medical appointments or from getting medications?: No    Lack of Transportation (Non-Medical): No  Physical Activity: Not on file  Stress: Not on file  Social Connections: Not on file     Family History: The patient's family history includes Breast cancer in her maternal aunt; Diabetes in her sister; Hypertension in her sister; Stroke in her maternal uncle; Uterine cancer in her mother.  ROS:   Please see the history of present illness.     All other systems reviewed and are negative.  EKGs/Labs/Other Studies Reviewed:    The following studies were reviewed today:  EKG Interpretation Date/Time:  Wednesday June 02 2024 08:39:05 EST Ventricular Rate:  81 PR Interval:  152 QRS Duration:  78 QT Interval:  380 QTC Calculation: 441 R Axis:   -10  Text Interpretation: Normal sinus rhythm Possible Anterior infarct Confirmed by Darliss Rogue ([id number]) on 06/02/2024 9:05:32 AM    Recent Labs: 02/19/2024: B Natriuretic Peptide 64.1; Magnesium  1.9 02/20/2024: ALT 32; BUN 20; Creatinine, Ser 0.83; Hemoglobin 8.7; Platelets 343; Potassium 3.9; Sodium 141  Recent Lipid Panel    Component Value Date/Time   CHOL 208 (H) 11/28/2017 1656   TRIG 117 11/28/2017 1656   HDL 47 11/28/2017 1656   CHOLHDL 4.4 11/28/2017 1656   LDLCALC 138 (H) 11/28/2017 1656     Risk Assessment/Calculations:             Physical Exam:    VS:  BP 130/64 (BP Location: Left Arm, Patient Position: Sitting, Cuff Size: Normal)   Pulse 81   Ht 5' 5.5 (1.664 m)   Wt 190 lb 9.6 oz (86.5 kg)   LMP 07/09/2007 (Approximate)   SpO2 99%   BMI 31.24 kg/m     Wt Readings from Last 3 Encounters:  06/02/24 190 lb 9.6 oz  (86.5 kg)  05/26/24 189 lb 3.2 oz (85.8 kg)  05/04/24 189 lb (85.7 kg)     GEN:  Well nourished, soft-spoken HEENT: Normal NECK: No JVD; No carotid bruits CARDIAC: RRR, no murmurs, rubs, gallops RESPIRATORY:  Clear to auscultation without rales, wheezing or rhonchi  ABDOMEN: Soft, non-tender, distended MUSCULOSKELETAL:  No edema; No deformity  SKIN: Warm and dry NEUROLOGIC:  Alert and oriented x 3 PSYCHIATRIC:  Normal affect   ASSESSMENT:    1. Coronary artery disease involving native heart, unspecified vessel or lesion type, unspecified whether angina present  2. Pure hypercholesterolemia   3. Primary hypertension    PLAN:    In order of problems listed above:  CAD/STEMI s/p PCI/DES x2 (LCx, OM1, PBA of OM2 on 7/25).  Denies chest pain.  Continue aspirin , Brilinta , Crestor  40 mg daily, Toprol  XL 50 mg daily.  Echo 8/25 EF 60 to 65%.  Repeat lipid panel.  Okay to return to work in January after completing cardiac rehab next month if no setbacks or events. Hyperlipidemia, continue Crestor  40 mg daily.  Obtain lipid profile. Hypertension, BP controlled.  Continue Toprol -XL 50 mg daily.  Follow-up in 6 months     Medication Adjustments/Labs and Tests Ordered: Current medicines are reviewed at length with the patient today.  Concerns regarding medicines are outlined above.  Orders Placed This Encounter  Procedures   Lipid panel   EKG 12-Lead   No orders of the defined types were placed in this encounter.   Patient Instructions  Medication Instructions:  Your physician recommends that you continue on your current medications as directed. Please refer to the Current Medication list given to you today.   *If you need a refill on your cardiac medications before your next appointment, please call your pharmacy*  Lab Work: Your provider would like for you to return in on Monday to have the following labs drawn: lipid panel.   Please go to [facility] [address] ([facility] [address] [facility], [place] [address] You do not need an appointment.  They are open from 8 am- 4:30 pm.  Lunch from 1:00 pm- 2:00 pm You DO need to be fasting.   You may also go to one of the following [facility]:  [address] [place], [place] [address] Phone: [phone] Lab hours: Mon-Fri 8 am- 5 pm    Lunch 12 pm- 1 pm  [address] [place],  [place]  [address]  [place]  Phone: [phone] Lab hours: 7 am- 4 pm Lunch 12 pm-1 pm   [address] [place],  [place]  [address]  [place]  Phone: [phone] Lab hours: Mon-Fri 8 am- 5 pm    Lunch 12 pm- 1 pm  If you have labs (blood work) drawn today and your tests are completely normal, you will receive your results only by: MyChart Message (if you have MyChart) OR A paper copy in the mail If you have any lab test that is abnormal or we need to change your treatment, we will call you to review the results.  Testing/Procedures: No test ordered today   Follow-Up: At [facility], you and your health needs are our priority.  As part of our continuing mission to provide you with exceptional heart care, our providers are all part of one team.  This team includes your primary Cardiologist (physician) and Advanced Practice Providers or APPs (Physician Assistants and Nurse Practitioners) who all work together to provide you with the care you need, when you need it.  Your next appointment:   6 month(s)  Provider:   You may see Dr Darliss or one of the following Advanced Practice Providers on your designated Care Team:   Lonni Meager, NP Lesley Maffucci, PA-C Bernardino Bring, PA-C Cadence Deering, PA-C Tylene Lunch, NP Barnie Hila, NP    We recommend signing up for the patient portal called MyChart.  Sign up information is provided on this After Visit Summary.  MyChart is used to connect with patients for Virtual Visits (Telemedicine).  Patients are able to view lab/test results, encounter notes, upcoming  appointments, etc.  Non-urgent messages can be sent to your provider as well.   To learn more about what you can do with MyChart, go to [email or web address].              Signed, Redell Cave, MD  06/02/2024 9:27 AM    [facility]

## 2024-06-02 NOTE — Progress Notes (Signed)
 Daily Session Note  Patient Details  Name: Debbie [name] MRN: [id number] Date of Birth: 23-Feb-1956 Referring Provider:   Flowsheet Row Cardiac Rehab from 04/05/2024 in [facility] Cardiac and Pulmonary Rehab  Referring Provider Dr. Redell Cave, MD    Encounter Date: 06/02/2024  Check In:  Session Check In - 06/02/24 0729       Check-In   Supervising physician immediately available to respond to emergencies See telemetry face sheet for immediately available ER MD    Location [facility]-Cardiac & Pulmonary Rehab    Staff Present Burnard Davenport RN,BSN,MPA;Maxon Conetta BS, Exercise Physiologist;Laura Cates RN,BSN;Margaret Best, MS, Exercise Physiologist    Virtual Visit No    Medication changes reported     No    Fall or balance concerns reported    No    Tobacco Cessation No Change    Warm-up and Cool-down Performed on first and last piece of equipment    Resistance Training Performed Yes    VAD Patient? No    PAD/SET Patient? No      Pain Assessment   Currently in Pain? No/denies             Social History   Tobacco Use  Smoking Status Never  Smokeless Tobacco Never    Goals Met:  Proper associated with RPD/PD & O2 Sat Independence with exercise equipment Exercise tolerated well No report of concerns or symptoms today Strength training completed today  Goals Unmet:  Not Applicable  Comments: Pt able to follow exercise prescription today without complaint.  Will continue to monitor for progression.    Dr. Oneil Pinal is Medical Director for [facility] Cardiac Rehabilitation.  Dr. Fuad Aleskerov is Medical Director for [facility] Pulmonary Rehabilitation.

## 2024-06-07 ENCOUNTER — Other Ambulatory Visit (HOSPITAL_BASED_OUTPATIENT_CLINIC_OR_DEPARTMENT_OTHER): Payer: Self-pay

## 2024-06-07 ENCOUNTER — Encounter: Attending: Cardiology

## 2024-06-07 ENCOUNTER — Other Ambulatory Visit: Payer: Self-pay | Admitting: Emergency Medicine

## 2024-06-07 DIAGNOSIS — Z955 Presence of coronary angioplasty implant and graft: Secondary | ICD-10-CM | POA: Insufficient documentation

## 2024-06-07 DIAGNOSIS — I213 ST elevation (STEMI) myocardial infarction of unspecified site: Secondary | ICD-10-CM | POA: Insufficient documentation

## 2024-06-07 DIAGNOSIS — I2510 Atherosclerotic heart disease of native coronary artery without angina pectoris: Secondary | ICD-10-CM

## 2024-06-07 MED ORDER — ZEPBOUND 15 MG/0.5ML ~~LOC~~ SOAJ
15.0000 mg | SUBCUTANEOUS | 2 refills | Status: AC
  Filled 2024-06-07 – 2024-06-14 (×2): qty 2, 28d supply, fill #0
  Filled 2024-07-09: qty 2, 28d supply, fill #1
  Filled 2024-08-10: qty 2, 28d supply, fill #2

## 2024-06-07 NOTE — Progress Notes (Signed)
 Daily Session Note  Patient Details  Name: Debbie [name] MRN: [id number] Date of Birth: 1955/12/17 Referring Provider:   Flowsheet Row Cardiac Rehab from 04/05/2024 in [facility] Cardiac and Pulmonary Rehab  Referring Provider Dr. Redell Cave, MD    Encounter Date: 06/07/2024  Check In:  Session Check In - 06/07/24 0736       Check-In   Supervising physician immediately available to respond to emergencies See telemetry face sheet for immediately available ER MD    Location [facility]-Cardiac & Pulmonary Rehab    Staff Present Burnard Davenport RN,BSN,MPA;Joseph [facility] Dyane BS, ACSM CEP, Exercise Physiologist;Jason Elnor RDN,LDN    Virtual Visit No    Medication changes reported     No    Fall or balance concerns reported    No    Tobacco Cessation No Change    Warm-up and Cool-down Performed on first and last piece of equipment    Resistance Training Performed Yes    VAD Patient? No    PAD/SET Patient? No      Pain Assessment   Currently in Pain? No/denies             Social History   Tobacco Use  Smoking Status Never  Smokeless Tobacco Never    Goals Met:  Proper associated with RPD/PD & O2 Sat Independence with exercise equipment Exercise tolerated well No report of concerns or symptoms today Strength training completed today  Goals Unmet:  Not Applicable  Comments: Pt able to follow exercise prescription today without complaint.  Will continue to monitor for progression.    Dr. Oneil Pinal is Medical Director for [facility] Cardiac Rehabilitation.  Dr. Fuad Aleskerov is Medical Director for [facility] Pulmonary Rehabilitation.

## 2024-06-08 ENCOUNTER — Ambulatory Visit (INDEPENDENT_AMBULATORY_CARE_PROVIDER_SITE_OTHER): Admitting: Physical Therapy

## 2024-06-08 ENCOUNTER — Encounter: Payer: Self-pay | Admitting: Physical Therapy

## 2024-06-08 ENCOUNTER — Encounter: Payer: Self-pay | Admitting: Cardiology

## 2024-06-08 ENCOUNTER — Other Ambulatory Visit: Payer: Self-pay

## 2024-06-08 DIAGNOSIS — M5459 Other low back pain: Secondary | ICD-10-CM

## 2024-06-08 DIAGNOSIS — M79652 Pain in left thigh: Secondary | ICD-10-CM

## 2024-06-08 DIAGNOSIS — M25562 Pain in left knee: Secondary | ICD-10-CM

## 2024-06-08 DIAGNOSIS — M25552 Pain in left hip: Secondary | ICD-10-CM

## 2024-06-08 DIAGNOSIS — G8929 Other chronic pain: Secondary | ICD-10-CM

## 2024-06-08 DIAGNOSIS — M6281 Muscle weakness (generalized): Secondary | ICD-10-CM

## 2024-06-08 LAB — LIPID PANEL
Chol/HDL Ratio: 3.1 ratio (ref 0.0–4.4)
Cholesterol, Total: 121 mg/dL (ref 100–199)
HDL: 39 mg/dL — ABNORMAL LOW (ref >39)
LDL Chol Calc (NIH): 69 mg/dL (ref 0–99)
Triglycerides: 62 mg/dL (ref 0–149)
VLDL Cholesterol Cal: 13 mg/dL (ref 5–40)

## 2024-06-08 NOTE — Therapy (Signed)
 OUTPATIENT PHYSICAL THERAPY TREATMENT   Patient Name: Debbie [name] MRN: [id number] DOB:10-20-1955, 68 y.o., female Today's Date: 06/08/2024   END OF SESSION:  PT End of Session - 06/08/24 0832     Visit Number 6    Number of Visits 16    Date for Recertification  07/06/24    Authorization Type BCBS    PT Start Time [phone]    PT Stop Time 0935    PT Time Calculation (min) 48 min    Activity Tolerance Patient tolerated treatment well    Behavior During Therapy [facility] for tasks assessed/performed               Past Medical History:  Diagnosis Date   Genital warts    Hyperlipidemia    Hypertension    Myocardial infarction [facility])    Peripheral arterial disease    Status post angioplasty with stent    Known femoral stent occlusion   Past Surgical History:  Procedure Laterality Date   ABDOMINAL AORTAGRAM N/A 08/09/2011   Procedure: ABDOMINAL EZELLA;  Surgeon: Carlin FORBES Haddock, MD;  Location: [facility] CATH LAB;  Service: Cardiovascular;  Laterality: N/A;   ANGIOPLASTY / STENTING FEMORAL  2008   Right femoral artery stenting done in [place]    COLONOSCOPY WITH PROPOFOL  N/A 06/11/2021   Procedure: COLONOSCOPY WITH PROPOFOL ;  Surgeon: Therisa Bi, MD;  Location: [facility] ENDOSCOPY;  Service: Gastroenterology;  Laterality: N/A;  2ND ARRIVAL, PLEASE   FEMORAL BYPASS Right 08/2011   Dr[facility] Sherre at [facility]   Patient Active Problem List   Diagnosis Date Noted   Syncope and collapse 02/19/2024   Lumbar radiculopathy 01/07/2024   Colon cancer (HCC) 08/14/2021   Hypertension 11/28/2017   Vitamin D  deficiency 05/13/2012   Hypomagnesemia 11/11/2011   Hyperlipidemia 09/06/2011   Prediabetes 09/06/2011   PAD (peripheral artery disease) 08/01/2011   Pain in limb 08/01/2011   S/P angioplasty with stent 08/01/2011    PCP: Eliverto Bette Hover, MD  REFERRING PROVIDER: Joane Artist RAMAN, MD  REFERRING DIAG: Lumbar radiculopathy  Rationale for Evaluation and Treatment: Rehabilitation  THERAPY  DIAG:  Other low back pain  Pain in left hip  Pain in left thigh  Chronic pain of left knee  Muscle weakness (generalized)  ONSET DATE: Chronic   SUBJECTIVE:          SUBJECTIVE STATEMENT: Patient reports she did not have any pain over the weekend but then she walked on the treadmill for 20 minutes at cardiac rehab and could tell the left hip and knee were stressed, then she went to the personal trainer and states she did well and did not have any pain after that. She did feel tense that evening and she feels like it was from trying the treadmill and having a lot of exercise, but she did use voltaren gel which seemed to help. She believes tomorrow is her last day with cardiac rehab and she plans to return to work on January 5th.   Eval: Patient reports chronic left hip and thigh pain, then in July she had a heart attack. She was discharged in August, and then when she returned home her left hip and knee were bothering her still. She does get some pain in her lower back with lifting heavy objects. The pain can radiate down the left thigh to the knee. The pain in the hip will bother her if she sleeps on her left side. She has been seeing a PT previously working on the left hip. She  does use a walking stick occasionally if the left leg flares up. She does feel like when she walks she compensates so will use the walking stick. She does feel like she has made progress regarding the left hip because she has been walking with cardiac rehab and this has gotten much better. She continues to be limited primarily by the pain in the left thigh with walking and standing, and feels like she has to take pressure off the left leg. She does do personal training 3x/week that focuses on her core, gait, and posture.  She is participating in cardiac rehab 3x/week and was seeing PT 2-3x/week.  PERTINENT HISTORY:  MI in June 2025  PAIN:  Are you having pain? Yes:  NPRS scale: 0/10 currently, 3-4/10 when pain  occurs Pain location: Left thigh to the knee Pain description: Discomfort Aggravating factors: Walking, standing Relieving factors: Rest, stretching, exercise, using walking stick  PRECAUTIONS: Patient is currently participating in cardiac rehab for recent MI  PATIENT GOALS: Pain relief, improving walking and standing tolerance   OBJECTIVE:  Note: Objective measures were completed at Evaluation unless otherwise noted. PATIENT SURVEYS:  PSFS: 5 Walking 22 laps on track / begin walking on treadmill without pain in left thigh/knee: 4 Standing for long periods due to left thigh pain: 5 Heavy lifting due to lower back: 6  MUSCLE LENGTH: Limitations with bilateral hamstring and hip flexor/quad  POSTURE:   Rounded shoulder posture  PALPATION: Tender to palpation lumbar with hypomobility noted with lumbar CPAs  LUMBAR ROM:   AROM eval  Flexion WFL  Extension 50%  Right lateral flexion   Left lateral flexion   Right rotation 75%  Left rotation 75%   (Blank rows = not tested)  LOWER EXTREMITY ROM:      Hip PROM grossly WFL and non-painful  LOWER EXTREMITY MMT:    MMT Right eval Left eval  Hip flexion 4- 4-  Hip extension 4- 4-  Hip abduction 3+ 3  Hip adduction    Hip internal rotation    Hip external rotation    Knee flexion 5 5  Knee extension 5 5  Ankle dorsiflexion    Ankle plantarflexion    Ankle inversion    Ankle eversion     (Blank rows = not tested)  FUNCTIONAL TESTS:  30 sec stand test: 11 reps  GAIT: Assistive device utilized: None Level of assistance: Complete Independence Comments: Trendelenburg on left   TREATMENT  [facility] Adult PT Treatment:                                                DATE: 06/08/2024 Side clamshell with green 3 x 15 left Bridge with green at knees 2 x 10 x 5 sec LAQ with 10# 3 x 12 each Deadlift with 30# 3 x 10 SLS 3 x 30 sec  Extensive time spent discussing patient's recent cardiac appointment and her exercise  schedule with cardiac rehab, personal trainer, and with PT. Discussed her body's response to new activities and explained pain stop light analogy (0-3/10 is green light, 4-6/10 is yellow, 7+/10 is red) to determine when to keep exercising, modify her activity, or stop activity.   PATIENT EDUCATION:  Education details: HEP Person educated: Patient Education method: Explanation, Demonstration, Actor cues, Verbal cues Education comprehension: verbalized understanding, returned demonstration, verbal cues required, tactile cues  required, and needs further education  HOME EXERCISE PROGRAM: Access Code: [email or web address]   ASSESSMENT: CLINICAL IMPRESSION: Patient tolerated therapy well with no adverse effects. Therapy focused primarily on progressing left hip and core strengthening with good tolerance. She was able to perform lifting with good control and no report of pain. Overall she does seem to be improving with her left hip and knee pain, and was able to tolerate walking on the treadmill in her cardiac rehab class. Spent time discussing her activity level and expectations regarding pain, when it is alright to continue activities or modify based on how her hip and knee respond. No changes made to HEP this visit. Patient would benefit from continued skilled PT to progress mobility and strength in order to reduce pain and maximize functional ability.   Eval: Patient is a 68 y.o. female who was seen today for physical therapy evaluation and treatment for chronic left thigh and knee pain that do seem consistent with left lumbar radicular pain, but she also reports pain consistent with left GTPS and left knee pain. She does exhibit mobility deficit of the lumbar spine and gross strength deficit of the core and hip region. Extensive time spent discussing her current exercise and rehab routine as noted above. Patient demonstrated current core and hip exercises and provided patient quadruped pelvic tilt exercises to  work of core neuromuscular control. She did report some lower back discomfort with supine core exercises likely due to weakness and mobility deficit, and reports left hip and thigh pain with hip abductor strengthening exercises. Patient would benefit from continued skilled PT to progress mobility and strength in order to reduce pain and maximize functional ability.  OBJECTIVE IMPAIRMENTS: Abnormal gait, decreased activity tolerance, decreased balance, decreased ROM, decreased strength, hypomobility, impaired flexibility, postural dysfunction, and pain.   ACTIVITY LIMITATIONS: lifting, standing, sleeping, and locomotion level  PARTICIPATION LIMITATIONS: meal prep, cleaning, shopping, and community activity  PERSONAL FACTORS: Fitness, Past/current experiences, and Time since onset of injury/illness/exacerbation are also affecting patient's functional outcome.    GOALS: Goals reviewed with patient? Yes  SHORT TERM GOALS: Target date: 06/08/2024  Patient will be I with initial HEP in order to progress with therapy. Baseline: HEP provided at eval 06/08/2024: independent Goal status: MET  2.  Patient will report left thigh, hip, knee pain </= 2/10 in order to reduce functional limitations and improve walking tolerance Baseline: 4-5/10 06/08/2024: 3-4/10  Goal status: ONGOING  LONG TERM GOALS: Target date: 07/06/2024  Patient will be I with final HEP to maintain progress from PT. Baseline: HEP provided at eval Goal status: INITIAL  2.  Patient will report PSFS >/= 8 in order to indicate improvement in their functional ability. Baseline: 5 Goal status: INITIAL  3.  Patient will demonstrate gross hip strength >/= 4/5 MMT to improve her walking and standing tolerance Baseline: see limitations above Goal status: INITIAL  4.  Patient will perform 30 sec stand test >/= 15 reps to indicate improved strength and endurance for better walking and standing tolerance Baseline: 11 reps Goal  status: INITIAL   PLAN: PT FREQUENCY: 1-2x/week  PT DURATION: 8 weeks  PLANNED INTERVENTIONS: 97164- PT Re-evaluation, 97750- Physical Performance Testing, 97110-Therapeutic exercises, 97530- Therapeutic activity, 97112- Neuromuscular re-education, 97535- Self Care, 02859- Manual therapy, [id number]- Gait training, [phone] (1-2 muscles), [id number] (3+ muscles)- Dry Needling, Patient/Family education, Balance training, Joint mobilization, Joint manipulation, Spinal manipulation, Spinal mobilization, Cryotherapy, and Moist heat.  PLAN FOR NEXT SESSION: Review HEP and progress PRN, manual for  lumbar mobility, progress lumbar motion and stretching, progress hip and core strengthening/stabilization   Elaine Daring, PT, DPT, LAT, ATC 06/08/24  11:16 AM Phone: [phone] Fax: [phone]

## 2024-06-08 NOTE — Patient Instructions (Signed)
 Access Code: [email or web address] URL: [email or web address] Date: 06/08/2024 Prepared by: Elaine Daring  Exercises - Seated Assisted Cervical Rotation with Towel  - 1 x daily - 2 sets - 10 reps - Supine Passive Cervical Retraction  - 1 x daily - 2 sets - 10 reps - 5 seconds hold - Cervical Retraction with Overpressure  - 1 x daily - 2 sets - 10 reps - 5 seconds hold - Putty Squeezes  - 1 x daily - Cat Cow  - 1 x daily - 2 sets - 5 reps - Quadruped Thoracic Rotation Full Range with Hand on Neck  - 1 x daily - 2 sets - 5 reps - Standing Row with Anchored Resistance  - 1 x daily - 3 sets - 10 reps - Wall Angels  - 1 x daily - 2 sets - 10 reps - Prone Y  - 1 x daily - 3 sets - 10 reps

## 2024-06-09 ENCOUNTER — Ambulatory Visit: Payer: Self-pay | Admitting: Cardiology

## 2024-06-09 ENCOUNTER — Encounter

## 2024-06-09 DIAGNOSIS — Z955 Presence of coronary angioplasty implant and graft: Secondary | ICD-10-CM

## 2024-06-09 DIAGNOSIS — I213 ST elevation (STEMI) myocardial infarction of unspecified site: Secondary | ICD-10-CM

## 2024-06-09 NOTE — Progress Notes (Signed)
 Cardiac Individual Treatment Plan  Patient Details  Name: Debbie [name] MRN: [id number] Date of Birth: February 28, 1956 Referring Provider:   Flowsheet Row Cardiac Rehab from 04/05/2024 in [facility] Cardiac and Pulmonary Rehab  Referring Provider Dr. Redell Cave, MD    Initial Encounter Date:  Flowsheet Row Cardiac Rehab from 04/05/2024 in [facility] Cardiac and Pulmonary Rehab  Date 04/05/24    Visit Diagnosis: Status post coronary artery stent placement  ST elevation myocardial infarction (STEMI), unspecified artery (HCC)  Patient's Home Medications on Admission:  Current Outpatient Medications:    aspirin  EC 81 MG tablet, Take 1 tablet (81 mg total) by mouth daily., Disp: 30 tablet, Rfl: 3   docusate sodium  (COLACE) 100 MG capsule, Take 100 mg by mouth 2 (two) times daily., Disp: , Rfl:    metoprolol  succinate (TOPROL -XL) 50 MG 24 hr tablet, Take 1 tablet (50 mg total) by mouth daily., Disp: 30 tablet, Rfl: 3   mupirocin ointment (BACTROBAN) 2 %, Apply 1 Application topically 2 (two) times daily., Disp: , Rfl:    pantoprazole  (PROTONIX ) 40 MG tablet, Take 40 mg by mouth daily before breakfast. (Patient not taking: Reported on 06/02/2024), Disp: , Rfl:    rosuvastatin  (CRESTOR ) 40 MG tablet, Take 1 tablet (40 mg total) by mouth at bedtime., Disp: 30 tablet, Rfl: 3   ticagrelor  (BRILINTA ) 90 MG TABS tablet, Take 1 tablet (90 mg total) by mouth 2 (two) times daily., Disp: 60 tablet, Rfl: 3   tirzepatide  (ZEPBOUND ) 15 MG/0.5ML Pen, Inject 15 mg into the skin once a week., Disp: 2 mL, Rfl: 2  Past Medical History: Past Medical History:  Diagnosis Date   Genital warts    Hyperlipidemia    Hypertension    Myocardial infarction [facility])    Peripheral arterial disease    Status post angioplasty with stent    Known femoral stent occlusion    Tobacco Use: Social History   Tobacco Use  Smoking Status Never  Smokeless Tobacco Never    Labs: Review Flowsheet       Latest Ref Rng & Units  08/09/2011 11/28/2017 02/20/2024 06/07/2024  Labs for ITP Cardiac and Pulmonary Rehab  Cholestrol 100 - 199 mg/dL - 791  - 878   LDL (calc) 0 - 99 mg/dL - 861  - 69   HDL-C >60 mg/dL - 47  - 39   Trlycerides 0 - 149 mg/dL - 882  - 62   Hemoglobin A1c 4.8 - 5.6 % 6.8  5.9  5.4  -  TCO2 0 - 100 mmol/L 24  - - -     Exercise Target Goals: Exercise Program Goal: Individual exercise prescription set using results from initial 6 min walk test and THRR while considering  patient's activity barriers and safety.   Exercise Prescription Goal: Initial exercise prescription builds to 30-45 minutes a day of aerobic activity, 2-3 days per week.  Home exercise guidelines will be given to patient during program as part of exercise prescription that the participant will acknowledge.   Education: Aerobic Exercise: - Group verbal and visual presentation on the components of exercise prescription. Introduces F.I.T.T principle from ACSM for exercise prescriptions.  Reviews F.I.T.T. principles of aerobic exercise including progression. Written material provided at class time. Flowsheet Row Cardiac Rehab from 05/26/2024 in [facility] Cardiac and Pulmonary Rehab  Date 04/21/24  Educator nt  Instruction Review Code 1- Tefl Teacher Understanding    Education: Resistance Exercise: - Group verbal and visual presentation on the components of exercise prescription. Introduces F.I.T.T  principle from ACSM for exercise prescriptions  Reviews F.I.T.T. principles of resistance exercise including progression. Written material provided at class time. Flowsheet Row Cardiac Rehab from 05/26/2024 in [facility] Cardiac and Pulmonary Rehab  Date 04/14/24  Educator nt  Instruction Review Code 1- Tefl Teacher Understanding     Education: Exercise & Equipment Safety: - Individual verbal instruction and demonstration of equipment use and safety with use of the equipment. Flowsheet Row Cardiac Rehab from 05/26/2024 in [facility] Cardiac and Pulmonary  Rehab  Date 04/05/24  Educator NT  Instruction Review Code 1- Verbalizes Understanding    Education: Exercise Physiology & General Exercise Guidelines: - Group verbal and written instruction with models to review the exercise physiology of the cardiovascular system and associated critical values. Provides general exercise guidelines with specific guidelines to those with heart or lung disease. Written material provided at class time. Flowsheet Row Cardiac Rehab from 05/26/2024 in [facility] Cardiac and Pulmonary Rehab  Date 04/07/24  Educator nt  Instruction Review Code 1- Tefl Teacher Understanding    Education: Flexibility, Balance, Mind/Body Relaxation: - Group verbal and visual presentation with interactive activity on the components of exercise prescription. Introduces F.I.T.T principle from ACSM for exercise prescriptions. Reviews F.I.T.T. principles of flexibility and balance exercise training including progression. Also discusses the mind body connection.  Reviews various relaxation techniques to help reduce and manage stress (i.e. Deep breathing, progressive muscle relaxation, and visualization). Balance handout provided to take home. Written material provided at class time. Flowsheet Row Cardiac Rehab from 05/26/2024 in [facility] Cardiac and Pulmonary Rehab  Date 04/14/24  Educator nt  Instruction Review Code 1- Verbalizes Understanding    Activity Barriers & Risk Stratification:  Activity Barriers & Cardiac Risk Stratification - 04/05/24 1102       Activity Barriers & Cardiac Risk Stratification   Activity Barriers Joint Problems;Neck/Spine Problems;Deconditioning;Assistive Device;Other (comment)    Comments L hip bursitis, L3/L4 nerve impingement    Cardiac Risk Stratification High          6 Minute Walk:  6 Minute Walk     Row Name 04/05/24 1048 05/26/24 0747       6 Minute Walk   Phase Initial Initial    Distance 985 feet 1445 feet    Distance % Change -- 46.7 %     Distance Feet Change -- 460 ft    Walk Time 5.3 minutes 6 minutes    # of Rest Breaks 2 0    MPH 2.11 2.74    METS 2.41 3.31    RPE 13 14    Perceived Dyspnea  0 0    VO2 Peak 8.42 11.58    Symptoms Yes (comment) Yes (comment)    Comments Left leg pain 5/10 uses cane, Left leg pain 4/10    Resting HR 83 bpm 78 bpm    Resting BP 140/84 122/74    Resting Oxygen Saturation  100 % 98 %    Exercise Oxygen Saturation  during 6 min walk 98 % 95 %    Max Ex. HR 116 bpm 121 bpm    Max Ex. BP 168/84 158/78    2 Minute Post BP 138/76 --       Oxygen Initial Assessment:   Oxygen Re-Evaluation:   Oxygen Discharge (Final Oxygen Re-Evaluation):   Initial Exercise Prescription:  Initial Exercise Prescription - 04/05/24 1100       Date of Initial Exercise RX and Referring Provider   Date 04/05/24    Referring Provider Dr. Redell Cave,  MD      Oxygen   Maintain Oxygen Saturation 88% or higher      Recumbant Bike   Level 2    RPM 50    Watts 12    Minutes 15    METs 2.41      NuStep   Level 2    SPM 80    Minutes 15    METs 2.41      Track   Laps 25    Minutes 15    METs 2.36      Prescription Details   Frequency (times per week) 3    Duration Progress to 30 minutes of continuous aerobic without signs/symptoms of physical distress      Intensity   THRR 40-80% of Max Heartrate 109-136    Ratings of Perceived Exertion 11-13    Perceived Dyspnea 0-4      Progression   Progression Continue to progress workloads to maintain intensity without signs/symptoms of physical distress.      Resistance Training   Training Prescription Yes    Weight 3 lb    Reps 10-15          Perform Capillary Blood Glucose checks as needed.  Exercise Prescription Changes:   Exercise Prescription Changes     Row Name 04/05/24 1100 04/21/24 1600 05/05/24 1000 05/05/24 1600 05/18/24 1400     Response to Exercise   Blood Pressure (Admit) 140/84 132/64 128/72 -- 110/64    Blood Pressure (Exercise) 168/84 160/80 158/70 -- --   Blood Pressure (Exit) 138/76 132/76 136/74 -- 118/70   Heart Rate (Admit) 83 bpm 91 bpm 81 bpm -- 72 bpm   Heart Rate (Exercise) 116 bpm 131 bpm 129 bpm -- 126 bpm   Heart Rate (Exit) 95 bpm 79 bpm 92 bpm -- 89 bpm   Oxygen Saturation (Admit) 100 % -- -- -- 99 %   Oxygen Saturation (Exercise) 98 % -- -- -- 96 %   Oxygen Saturation (Exit) -- -- -- -- 97 %   Rating of Perceived Exertion (Exercise) 13 15 13  -- 13   Perceived Dyspnea (Exercise) 0 -- -- -- --   Symptoms L leg pain 5/10 -- none -- none   Comments Results 1st 2 weeks of exercise sessions -- -- --   Duration -- Progress to 30 minutes of  aerobic without signs/symptoms of physical distress Progress to 30 minutes of  aerobic without signs/symptoms of physical distress -- Progress to 30 minutes of  aerobic without signs/symptoms of physical distress   Intensity -- THRR unchanged THRR unchanged -- THRR unchanged     Progression   Progression -- Continue to progress workloads to maintain intensity without signs/symptoms of physical distress. Continue to progress workloads to maintain intensity without signs/symptoms of physical distress. -- Continue to progress workloads to maintain intensity without signs/symptoms of physical distress.   Average METs -- 2.69 2.9 -- 2.99     Resistance Training   Training Prescription -- Yes Yes -- Yes   Weight -- 3 lb 3 lb -- 3 lb   Reps -- 10-15 10-15 -- 10-15     Interval Training   Interval Training -- No No -- No     Recumbant Bike   Level -- 4 3 -- 4   Watts -- 12 25 -- 25   Minutes -- 15 15 -- 15   METs -- 3.5 2.89 -- 2.9     NuStep   Level -- 5  5 -- 4   Minutes -- 15 15 -- 15   METs -- 2.8 3 -- 4.4     T5 Nustep   Level -- -- -- -- 3   SPM -- -- -- -- 80   Minutes -- -- -- -- 15   METs -- -- -- -- 1.8     Rower   Level -- -- 6 -- --   Minutes -- -- 15 -- --   METs -- -- 28 -- --     Track   Laps -- 10  hallway 22  -- 23   Minutes -- 15 15 -- 15   METs -- 1.54 2.2 -- 2.25     Home Exercise Plan   Plans to continue exercise at -- -- -- [employer] (comment)  [employer] for strength training with a personal trainer, [employer] for bike and PT [employer] (comment)  [employer] for strength training with a personal trainer, [employer] for bike and PT   Frequency -- -- -- Add 2 additional days to program exercise sessions. Add 2 additional days to program exercise sessions.   Initial Home Exercises Provided -- -- -- 05/05/24 05/05/24     Oxygen   Maintain Oxygen Saturation -- 88% or higher 88% or higher -- 88% or higher    Row Name 06/01/24 1400             Response to Exercise   Blood Pressure (Admit) 118/60       Blood Pressure (Exit) 120/62       Heart Rate (Admit) 70 bpm       Heart Rate (Exercise) 107 bpm       Heart Rate (Exit) 89 bpm       Oxygen Saturation (Admit) 95 %       Oxygen Saturation (Exercise) 94 %       Oxygen Saturation (Exit) 97 %       Rating of Perceived Exertion (Exercise) 13       Symptoms none       Duration Continue with 30 min of aerobic exercise without signs/symptoms of physical distress.       Intensity THRR unchanged         Progression   Progression Continue to progress workloads to maintain intensity without signs/symptoms of physical distress.       Average METs 3.18         Resistance Training   Training Prescription Yes       Weight 3 lb       Reps 10-15         Interval Training   Interval Training No         Recumbant Bike   Level 5       Watts 33       Minutes 15       METs 2.92         NuStep   Level 6       Minutes 15       METs 3.4         REL-XR   Level 4       Minutes 15       METs 3.6         Track   Laps 8       Minutes 15       METs 1.44         Home Exercise Plan   Plans to continue  exercise at [employer] (comment)  [employer] for strength training with a personal trainer, [employer] for bike and PT       Frequency Add 2 additional days to program exercise sessions.       Initial Home Exercises Provided 05/05/24         Oxygen   Maintain Oxygen Saturation 88% or higher          Exercise Comments:   Exercise Comments     Row Name 04/07/24 9277 06/09/24 0753         Exercise Comments First full day of exercise!  Patient was oriented to gym and equipment including functions, settings, policies, and procedures.  Patient's individual exercise prescription and treatment plan were reviewed.  All starting workloads were established based on the results of the 6 minute walk test done at initial orientation visit.  The plan for exercise progression was also introduced and progression will be customized based on patient's performance and goals. Debbie [name] graduated today from  rehab with 36 sessions completed.  Details of the patient's exercise prescription and what She needs to do in order to continue the prescription and progress were discussed with patient.  Patient was given a copy of prescription and goals.  Patient verbalized understanding. Debbie [name] plans to continue to exercise by going to a gym.         Exercise Goals and Review:   Exercise Goals     Row Name 04/05/24 1105             Exercise Goals   Increase Physical Activity Yes       Intervention Develop an individualized exercise prescription for aerobic and resistive training based on initial evaluation findings, risk stratification, comorbidities and participant's personal goals.;Provide advice, education, support and counseling about physical activity/exercise needs.       Expected Outcomes Short Term: Attend rehab on a regular basis to increase amount of physical activity.;Long Term: Add in home exercise to make exercise part of routine and to increase amount of physical activity.;Long Term: Exercising regularly at least 3-5 days a week.       Increase Strength and Stamina Yes       Intervention  Develop an individualized exercise prescription for aerobic and resistive training based on initial evaluation findings, risk stratification, comorbidities and participant's personal goals.;Provide advice, education, support and counseling about physical activity/exercise needs.       Expected Outcomes Short Term: Increase workloads from initial exercise prescription for resistance, speed, and METs.;Long Term: Improve cardiorespiratory fitness, muscular endurance and strength as measured by increased METs and functional capacity ( );Short Term: Perform resistance training exercises routinely during rehab and add in resistance training at home       Able to understand and use rate of perceived exertion (RPE) scale Yes       Intervention Provide education and explanation on how to use RPE scale       Expected Outcomes Short Term: Able to use RPE daily in rehab to express subjective intensity level;Long Term:  Able to use RPE to guide intensity level when exercising independently       Able to understand and use Dyspnea scale Yes       Intervention Provide education and explanation on how to use Dyspnea scale       Expected Outcomes Short Term: Able to use Dyspnea scale daily in rehab to express subjective sense of shortness of breath during exertion;Long Term: Able to use Dyspnea scale to  guide intensity level when exercising independently       Knowledge and understanding of Target Heart Rate Range (THRR) Yes       Intervention Provide education and explanation of THRR including how the numbers were predicted and where they are located for reference       Expected Outcomes Short Term: Able to state/look up THRR;Long Term: Able to use THRR to govern intensity when exercising independently;Short Term: Able to use daily as guideline for intensity in rehab       Able to check pulse independently Yes       Intervention Provide education and demonstration on how to check pulse in carotid and radial  arteries.;Review the importance of being able to check your own pulse for safety during independent exercise       Expected Outcomes Short Term: Able to explain why pulse checking is important during independent exercise;Long Term: Able to check pulse independently and accurately       Understanding of Exercise Prescription Yes       Intervention Provide education, explanation, and written materials on patient's individual exercise prescription       Expected Outcomes Short Term: Able to explain program exercise prescription;Long Term: Able to explain home exercise prescription to exercise independently          Exercise Goals Re-Evaluation :  Exercise Goals Re-Evaluation     Row Name 04/07/24 9277 04/21/24 1617 05/05/24 1022 05/05/24 1621 05/18/24 1448     Exercise Goal Re-Evaluation   Exercise Goals Review Increase Physical Activity;Able to understand and use rate of perceived exertion (RPE) scale;Knowledge and understanding of Target Heart Rate Range (THRR);Understanding of Exercise Prescription;Increase Strength and Stamina;Able to understand and use Dyspnea scale;Able to check pulse independently Increase Physical Activity;Understanding of Exercise Prescription;Increase Strength and Stamina Increase Physical Activity;Understanding of Exercise Prescription;Increase Strength and Stamina Increase Physical Activity;Able to understand and use rate of perceived exertion (RPE) scale;Knowledge and understanding of Target Heart Rate Range (THRR);Understanding of Exercise Prescription;Increase Strength and Stamina;Able to understand and use Dyspnea scale;Able to check pulse independently Increase Physical Activity;Understanding of Exercise Prescription;Increase Strength and Stamina   Comments Reviewed RPE and dyspnea scale, THR and program prescription with pt today.  Pt voiced understanding and was given a copy of goals to take home. Debbie [name] is off to a good start in the program and she completed her first  2 weeks of exercise sessions in this review. She was able to walk 10 laps on the hallway track, work at level 4 on the recumbent bike, and work at level 5 on the T4 nustep. We will continue to monitor her progress in the program. Debbie [name] is doing well in rehab. She was recently able to increase her laps on the track from 10 to 22 laps, and was able to try the rowing machine at level 6. We will continue to monitor her progress in the program. Reviewed home exercise with pt today.  Pt plans to go to [employer] for strength training with a personal trainer and [employer] for aerobic exercise on the bike and for PT for exercise. She plans to add 1-2 days a week at home.  Reviewed THR, pulse, RPE, sign and symptoms, pulse oximetery and when to call 911 or MD.  Also discussed weather considerations and indoor options.  Pt voiced understanding. Debbie [name] continues to do well in rehab. She will be due for her post in the next review period and she hopes to improve. She increased to  level 4 on the recumbent bike. She also increased to 23 laps on the track. She maintained level 3 on the T5 nustep and decreased slightly to level 4 on the T4 nustep. We will continue to monitor her progress in the program.   Expected Outcomes Short: Use RPE daily to regulate intensity. Long: Follow program prescription in THR. Short: Continue to follow current exercise prescription. Long: Continue exercise to improve strength and stamina. Short: continue to increase track laps. Long: Continue exercise to improve strength and stamina. Short: Add 1-2 additional days of exercise at home. Long: Continue to exercise at home independently Short: Improve on post . Long: Continue to increase overall METs and stamina.    Row Name 06/01/24 1411             Exercise Goal Re-Evaluation   Exercise Goals Review Increase Physical Activity;Understanding of Exercise Prescription;Increase Strength and Stamina       Comments Debbie [name] is doing  well and is close to graduating from the program. She recently completed her post and improved by 46.7%! She also improved to level 6 on the T4 nustep and level 5 on the recumbent bike. We will continue to monitor her progress until she graduates from the program.       Expected Outcomes Short: Graduate. Long: Continue to exercise independently.          Discharge Exercise Prescription (Final Exercise Prescription Changes):  Exercise Prescription Changes - 06/01/24 1400       Response to Exercise   Blood Pressure (Admit) 118/60    Blood Pressure (Exit) 120/62    Heart Rate (Admit) 70 bpm    Heart Rate (Exercise) 107 bpm    Heart Rate (Exit) 89 bpm    Oxygen Saturation (Admit) 95 %    Oxygen Saturation (Exercise) 94 %    Oxygen Saturation (Exit) 97 %    Rating of Perceived Exertion (Exercise) 13    Symptoms none    Duration Continue with 30 min of aerobic exercise without signs/symptoms of physical distress.    Intensity THRR unchanged      Progression   Progression Continue to progress workloads to maintain intensity without signs/symptoms of physical distress.    Average METs 3.18      Resistance Training   Training Prescription Yes    Weight 3 lb    Reps 10-15      Interval Training   Interval Training No      Recumbant Bike   Level 5    Watts 33    Minutes 15    METs 2.92      NuStep   Level 6    Minutes 15    METs 3.4      REL-XR   Level 4    Minutes 15    METs 3.6      Track   Laps 8    Minutes 15    METs 1.44      Home Exercise Plan   Plans to continue exercise at [employer] (comment)   [employer] for strength training with a personal trainer, [employer] for bike and PT   Frequency Add 2 additional days to program exercise sessions.    Initial Home Exercises Provided 05/05/24      Oxygen   Maintain Oxygen Saturation 88% or higher          Nutrition:  Target Goals: Understanding of nutrition guidelines, daily intake of sodium  1500mg , cholesterol 200mg , calories  30% from fat and 7% or less from saturated fats, daily to have 5 or more servings of fruits and vegetables.  Education: Nutrition 1 -Group instruction provided by verbal, written material, interactive activities, discussions, models, and posters to present general guidelines for heart healthy nutrition including macronutrients, label reading, and promoting whole foods over processed counterparts. Education serves as pensions consultant of discussion of heart healthy eating for all. Written material provided at class time. Flowsheet Row Cardiac Rehab from 05/26/2024 in [facility] Cardiac and Pulmonary Rehab  Date 04/28/24  Educator jg  Instruction Review Code 1- Verbalizes Understanding     Education: Nutrition 2 -Group instruction provided by verbal, written material, interactive activities, discussions, models, and posters to present general guidelines for heart healthy nutrition including sodium, cholesterol, and saturated fat. Providing guidance of habit forming to improve blood pressure, cholesterol, and body weight. Written material provided at class time. Flowsheet Row Cardiac Rehab from 05/26/2024 in [facility] Cardiac and Pulmonary Rehab  Date 05/05/24  Educator jg  Instruction Review Code 1- Verbalizes Understanding      Biometrics:  Pre Biometrics - 04/05/24 1106       Pre Biometrics   Height 5' 4.5 (1.638 m)    Weight 203 lb 9.6 oz (92.4 kg)    Waist Circumference 43 inches    Hip Circumference 47 inches    Waist to Hip Ratio 0.91 %    BMI (Calculated) 34.42    Single Leg Stand 7.3 seconds          Post Biometrics - 05/26/24 0749        Post  Biometrics   Height 5' 4.5 (1.638 m)    Weight 189 lb 3.2 oz (85.8 kg)    Waist Circumference 39 inches    Hip Circumference 44.5 inches    Waist to Hip Ratio 0.88 %    BMI (Calculated) 31.99    Single Leg Stand 13.9 seconds          Nutrition Therapy Plan and Nutrition Goals:  Nutrition Therapy  & Goals - 04/05/24 1308       Nutrition Therapy   Diet Cardiac, Low Na    Protein (specify units) 80    Fiber 25 grams    Whole Grain Foods 3 servings    Saturated Fats 12 max. grams    Fruits and Vegetables 5 servings/day    Sodium 1.5 grams      Personal Nutrition Goals   Nutrition Goal Eat 3 times per day, small frequent meals or nutrient dense snacks    Personal Goal #2 Eat 15-30gProtein and 30-60gCarbs at each meal.    Personal Goal #3 Read labels and reduce sodium intake to below 2300mg . Ideally 1500mg  per day.    Comments Patient on zepbound  and says she has been seeing a RD at a weight loss program at [facility]. She eats small meals and says her appetite is reduced. Reminded her of the importance of eating smaller more frequent meals and snacks with nutrient dense foods. Focus on protein, with goal or ~80g per day. Encouraged her to include more healthy fats if her appetite prevents her from eating at least 1200kcal. Reviewed mediterranean diet handout. Educated on types of fats, sources, and how to read labels. Encouraged her to read labels and limit saturated fat to less than 12g per day and less than 1500mg  of sodium per day. Brainstormed several small meals and snacks with foods she likes and will eat.      Intervention Plan  Intervention Prescribe, educate and counsel regarding individualized specific dietary modifications aiming towards targeted core components such as weight, hypertension, lipid management, diabetes, heart failure and other comorbidities.;Nutrition handout(s) given to patient.    Expected Outcomes Short Term Goal: Understand basic principles of dietary content, such as calories, fat, sodium, cholesterol and nutrients.;Short Term Goal: A plan has been developed with personal nutrition goals set during dietitian appointment.;Long Term Goal: Adherence to prescribed nutrition plan.          Nutrition Assessments:  MEDIFICTS Score Key: >=70 Need to make dietary  changes  40-70 Heart Healthy Diet <= 40 Therapeutic Level Cholesterol Diet  Flowsheet Row Cardiac Rehab from 04/12/2024 in [facility] Cardiac and Pulmonary Rehab  Picture Your Plate Total Score on Admission 80   Picture Your Plate Scores: <59 Unhealthy dietary pattern with much room for improvement. 41-50 Dietary pattern unlikely to meet recommendations for good health and room for improvement. 51-60 More healthful dietary pattern, with some room for improvement.  >60 Healthy dietary pattern, although there may be some specific behaviors that could be improved.    Nutrition Goals Re-Evaluation:   Nutrition Goals Discharge (Final Nutrition Goals Re-Evaluation):   Psychosocial: Target Goals: Acknowledge presence or absence of significant depression and/or stress, maximize coping skills, provide positive support system. Participant is able to verbalize types and ability to use techniques and skills needed for reducing stress and depression.   Education: Stress, Anxiety, and Depression - Group verbal and visual presentation to define topics covered.  Reviews how body is impacted by stress, anxiety, and depression.  Also discusses healthy ways to reduce stress and to treat/manage anxiety and depression. Written material provided at class time.   Education: Sleep Hygiene -Provides group verbal and written instruction about how sleep can affect your health.  Define sleep hygiene, discuss sleep cycles and impact of sleep habits. Review good sleep hygiene tips.   Initial Review & Psychosocial Screening:  Initial Psych Review & Screening - 03/29/24 1402       Initial Review   Current issues with None Identified      Family Dynamics   Good Support System? Yes    Comments Patient stated that she has a good support system of family and friends. She stated she does not have concerns with depression or stress and is out of work temporarily until she is fully recovered from heart event. Patient  stated she is a [employer] and is interested in continuing to lose weight and is currently taking Zepbound . She stated she has lost about 30 pounds. Patient is interested in completing cardiac rehab to ensure positive health outcomes.      Barriers   Psychosocial barriers to participate in program There are no identifiable barriers or psychosocial needs.      Screening Interventions   Interventions Encouraged to exercise;Provide feedback about the scores to participant    Expected Outcomes Short Term goal: Identification and review with participant of any Quality of Life or Depression concerns found by scoring the questionnaire.;Long Term goal: The participant improves quality of Life and PHQ9 Scores as seen by post scores and/or verbalization of changes          Quality of Life Scores:   Quality of Life - 04/12/24 0809       Quality of Life   Select Quality of Life      Quality of Life Scores   Health/Function Pre 24.32 %    Socioeconomic Pre 23.21 %    Psych/Spiritual Pre 23.79 %  Family Pre 23.63 %    GLOBAL Pre 23.88 %         Scores of 19 and below usually indicate a poorer quality of life in these areas.  A difference of  2-3 points is a clinically meaningful difference.  A difference of 2-3 points in the total score of the Quality of Life Index has been associated with significant improvement in overall quality of life, self-image, physical symptoms, and general health in studies assessing change in quality of life.  PHQ-9: Review Flowsheet       04/05/2024 08/16/2022 08/08/2021  Depression screen PHQ 2/9  Decreased Interest 0 0 0  Down, Depressed, Hopeless 0 0 0  PHQ - 2 Score 0 0 0  Altered sleeping 0 - -  Tired, decreased energy 0 - -  Change in appetite 0 - -  Feeling bad or failure about yourself  0 - -  Trouble concentrating 0 - -  Moving slowly or fidgety/restless 0 - -  Suicidal thoughts 0 - -  PHQ-9 Score 0  - -    Details       Data saved with a previous  flowsheet row definition        Interpretation of Total Score  Total Score Depression Severity:  1-4 = Minimal depression, 5-9 = Mild depression, 10-14 = Moderate depression, 15-19 = Moderately severe depression, 20-27 = Severe depression   Psychosocial Evaluation and Intervention:  Psychosocial Evaluation - 03/29/24 1405       Psychosocial Evaluation & Interventions   Interventions Relaxation education;Encouraged to exercise with the program and follow exercise prescription;Stress management education    Comments Patient stated that she has a good support system of family and friends. She stated she does not have concerns with depression or stress and is out of work temporarily until she is fully recovered from heart event. Patient stated she is a [employer] and is interested in continuing to lose weight and is currently taking Zepbound . She stated she has lost about 30 pounds. Patient is interested in completing cardiac rehab to ensure positive health outcomes.    Expected Outcomes ST: Attend cardiac rehab for education and exercise. LT: Develop and maintain positive self-care habits.    Continue Psychosocial Services  Follow up required by staff          Psychosocial Re-Evaluation:  Psychosocial Re-Evaluation     Row Name 05/19/24 [phone]             Psychosocial Re-Evaluation   Current issues with None Identified       Comments Patient reports no issues with their current mental states, sleep, stress, depression or anxiety. Will follow up with patient in a few weeks for any changes.       Expected Outcomes Short: Continue to exercise regularly to support mental health and notify staff of any changes. Long: maintain mental health and well being through teaching of rehab or prescribed medications independently.       Interventions Encouraged to attend Cardiac Rehabilitation for the exercise       Continue Psychosocial Services  Follow up required by staff          Psychosocial  Discharge (Final Psychosocial Re-Evaluation):  Psychosocial Re-Evaluation - 05/19/24 0756       Psychosocial Re-Evaluation   Current issues with None Identified    Comments Patient reports no issues with their current mental states, sleep, stress, depression or anxiety. Will follow up with patient in a few weeks for  any changes.    Expected Outcomes Short: Continue to exercise regularly to support mental health and notify staff of any changes. Long: maintain mental health and well being through teaching of rehab or prescribed medications independently.    Interventions Encouraged to attend Cardiac Rehabilitation for the exercise    Continue Psychosocial Services  Follow up required by staff          Vocational Rehabilitation: Provide vocational rehab assistance to qualifying candidates.   Vocational Rehab Evaluation & Intervention:  Vocational Rehab - 03/29/24 1401       Initial Vocational Rehab Evaluation & Intervention   Assessment shows need for Vocational Rehabilitation No          Education: Education Goals: Education classes will be provided on a variety of topics geared toward better understanding of heart health and risk factor modification. Participant will state understanding/return demonstration of topics presented as noted by education test scores.  Learning Barriers/Preferences:  Learning Barriers/Preferences - 03/29/24 1401       Learning Barriers/Preferences   Learning Barriers None    Learning Preferences Computer/Internet;Skilled Demonstration;Pictoral;Video;Written Material          General Cardiac Education Topics:  AED/CPR: - Group verbal and written instruction with the use of models to demonstrate the basic use of the AED with the basic ABC's of resuscitation.   Test and Procedures: - Group verbal and visual presentation and models provide information about basic cardiac anatomy and function. Reviews the testing methods done to diagnose heart  disease and the outcomes of the test results. Describes the treatment choices: Medical Management, Angioplasty, or Coronary Bypass Surgery for treating various heart conditions including Myocardial Infarction, Angina, Valve Disease, and Cardiac Arrhythmias. Written material provided at class time. Flowsheet Row Cardiac Rehab from 05/26/2024 in [facility] Cardiac and Pulmonary Rehab  Date 05/12/24  Educator lc  Instruction Review Code 1- Verbalizes Understanding    Medication Safety: - Group verbal and visual instruction to review commonly prescribed medications for heart and lung disease. Reviews the medication, class of the drug, and side effects. Includes the steps to properly store meds and maintain the prescription regimen. Written material provided at class time. Flowsheet Row Cardiac Rehab from 05/26/2024 in [facility] Cardiac and Pulmonary Rehab  Date 05/19/24  Educator kb  Instruction Review Code 1- Verbalizes Understanding    Intimacy: - Group verbal instruction through game format to discuss how heart and lung disease can affect sexual intimacy. Written material provided at class time. Flowsheet Row Cardiac Rehab from 05/26/2024 in [facility] Cardiac and Pulmonary Rehab  Date 04/21/24  Educator nt  Instruction Review Code 1- Tefl Teacher Understanding    Know Your Numbers and Heart Failure: - Group verbal and visual instruction to discuss disease risk factors for cardiac and pulmonary disease and treatment options.  Reviews associated critical values for Overweight/Obesity, Hypertension, Cholesterol, and Diabetes.  Discusses basics of heart failure: signs/symptoms and treatments.  Introduces Heart Failure Zone chart for action plan for heart failure. Written material provided at class time.   Infection Prevention: - Provides verbal and written material to individual with discussion of infection control including proper hand washing and proper equipment cleaning during exercise session. Flowsheet  Row Cardiac Rehab from 05/26/2024 in [facility] Cardiac and Pulmonary Rehab  Date 04/05/24  Educator NT  Instruction Review Code 1- Verbalizes Understanding    Falls Prevention: - Provides verbal and written material to individual with discussion of falls prevention and safety. Flowsheet Row Cardiac Rehab from 05/26/2024 in [facility] Cardiac  and Pulmonary Rehab  Date 03/29/24  Educator kb  Instruction Review Code 1- Verbalizes Understanding    Other: -Provides group and verbal instruction on various topics (see comments)   Knowledge Questionnaire Score:  Knowledge Questionnaire Score - 06/07/24 0818       Knowledge Questionnaire Score   Pre Score 26/26          Core Components/Risk Factors/Patient Goals at Admission:  Personal Goals and Risk Factors at Admission - 03/29/24 1401       Core Components/Risk Factors/Patient Goals on Admission    Weight Management Weight Loss    Hypertension Yes    Intervention Provide education on lifestyle modifcations including regular physical activity/exercise, weight management, moderate sodium restriction and increased consumption of fresh fruit, vegetables, and low fat dairy, alcohol moderation, and smoking cessation.    Expected Outcomes Long Term: Maintenance of blood pressure at goal levels.;Short Term: Continued assessment and intervention until BP is < 140/32mm HG in hypertensive participants. < 130/52mm HG in hypertensive participants with diabetes, heart failure or chronic kidney disease.    Lipids Yes    Intervention Provide education and support for participant on nutrition & aerobic/resistive exercise along with prescribed medications to achieve LDL 70mg , HDL >40mg .    Expected Outcomes Short Term: Participant states understanding of desired cholesterol values and is compliant with medications prescribed. Participant is following exercise prescription and nutrition guidelines.;Long Term: Cholesterol controlled with medications as  prescribed, with individualized exercise RX and with personalized nutrition plan. Value goals: LDL < 70mg , HDL > 40 mg.          Education:Diabetes - Individual verbal and written instruction to review signs/symptoms of diabetes, desired ranges of glucose level fasting, after meals and with exercise. Acknowledge that pre and post exercise glucose checks will be done for 3 sessions at entry of program.   Core Components/Risk Factors/Patient Goals Review:   Goals and Risk Factor Review     Row Name 05/19/24 0758             Core Components/Risk Factors/Patient Goals Review   Personal Goals Review Weight Management/Obesity       Review Debbie [name] states she has a blood pressure cuff to check her blood pressure at home. She wants to lose a little weight. She is going to have a conversation with her cardiologist to see where she needs to be.       Expected Outcomes Short: lose a few pounds in the next couple weeks. Long: reach weight goal.          Core Components/Risk Factors/Patient Goals at Discharge (Final Review):   Goals and Risk Factor Review - 05/19/24 0758       Core Components/Risk Factors/Patient Goals Review   Personal Goals Review Weight Management/Obesity    Review Debbie [name] states she has a blood pressure cuff to check her blood pressure at home. She wants to lose a little weight. She is going to have a conversation with her cardiologist to see where she needs to be.    Expected Outcomes Short: lose a few pounds in the next couple weeks. Long: reach weight goal.          ITP Comments:  ITP Comments     Row Name 03/29/24 1407 04/05/24 1022 04/07/24 0722 04/28/24 0810 05/26/24 0925   ITP Comments Initial phone call completed. Diagnosis can be found in [facility] 02/19/2024. EP Orientation scheduled for Monday, April 05, 2024 @ 0900. Completed and gym orientation for cardiac rehab.  Initial ITP created and sent for review to Dr. Oneil Pinal, Medical Director. First full day  of exercise!  Patient was oriented to gym and equipment including functions, settings, policies, and procedures.  Patient's individual exercise prescription and treatment plan were reviewed.  All starting workloads were established based on the results of the 6 minute walk test done at initial orientation visit.  The plan for exercise progression was also introduced and progression will be customized based on patient's performance and goals. 30 Day review completed. Medical Director ITP review done, changes made as directed, and signed approval by Medical Director. New to program. 30 Day review completed. Medical Director ITP review done, changes made as directed, and signed approval by Medical Director. New to program.    Row Name 06/09/24 0753           ITP Comments Debbie [name] graduated today from  rehab with 36 sessions completed.  Details of the patient's exercise prescription and what She needs to do in order to continue the prescription and progress were discussed with patient.  Patient was given a copy of prescription and goals.  Patient verbalized understanding. Debbie [name] plans to continue to exercise by going to a gym.          Comments: discharge ITP

## 2024-06-09 NOTE — Progress Notes (Signed)
 Discharge Summary Patient: Debbie [name] DOB: [birth year]   Debbie [name] graduated today from  rehab with 36 sessions completed.  Details of the patient's exercise prescription and what She needs to do in order to continue the prescription and progress were discussed with patient.  Patient was given a copy of prescription and goals.  Patient verbalized understanding. Debbie [name] plans to continue to exercise by going to a gym.   6 Minute Walk     Row Name 04/05/24 1048 05/26/24 0747       6 Minute Walk   Phase Initial Initial    Distance 985 feet 1445 feet    Distance % Change -- 46.7 %    Distance Feet Change -- 460 ft    Walk Time 5.3 minutes 6 minutes    # of Rest Breaks 2 0    MPH 2.11 2.74    METS 2.41 3.31    RPE 13 14    Perceived Dyspnea  0 0    VO2 Peak 8.42 11.58    Symptoms Yes (comment) Yes (comment)    Comments Left leg pain 5/10 uses cane, Left leg pain 4/10    Resting HR 83 bpm 78 bpm    Resting BP 140/84 122/74    Resting Oxygen Saturation  100 % 98 %    Exercise Oxygen Saturation  during 6 min walk 98 % 95 %    Max Ex. HR 116 bpm 121 bpm    Max Ex. BP 168/84 158/78    2 Minute Post BP 138/76 --

## 2024-06-09 NOTE — Progress Notes (Signed)
 Daily Session Note  Patient Details  Name: Debbie [name] MRN: [id number] Date of Birth: 07/01/1956 Referring Provider:   Flowsheet Row Cardiac Rehab from 04/05/2024 in [facility] Cardiac and Pulmonary Rehab  Referring Provider Dr. Redell Cave, MD    Encounter Date: 06/09/2024  Check In:  Session Check In - 06/09/24 0749       Check-In   Supervising physician immediately available to respond to emergencies See telemetry face sheet for immediately available ER MD    Location [facility]-Cardiac & Pulmonary Rehab    Staff Present Burnard Davenport RN,BSN,MPA;Maxon Conetta BS, Exercise Physiologist;Joseph Rolinda NORWOOD HARMAN SAMMIE;Karna Serve PhD, RN,CNS,CEN    Virtual Visit No    Medication changes reported     No    Fall or balance concerns reported    No    Tobacco Cessation No Change    Warm-up and Cool-down Performed on first and last piece of equipment    Resistance Training Performed Yes    VAD Patient? No    PAD/SET Patient? No      Pain Assessment   Currently in Pain? No/denies             Social History   Tobacco Use  Smoking Status Never  Smokeless Tobacco Never    Goals Met:  Proper associated with RPD/PD & O2 Sat Independence with exercise equipment Exercise tolerated well No report of concerns or symptoms today Strength training completed today  Goals Unmet:  Not Applicable  Comments:  Jillisa graduated today from  rehab with 36 sessions completed.  Details of the patient's exercise prescription and what She needs to do in order to continue the prescription and progress were discussed with patient.  Patient was given a copy of prescription and goals.  Patient verbalized understanding. Lakara plans to continue to exercise by going to a gym.    Dr. Oneil Pinal is Medical Director for [facility] Cardiac Rehabilitation.  Dr. Fuad Aleskerov is Medical Director for [facility] Pulmonary Rehabilitation.

## 2024-06-10 ENCOUNTER — Encounter: Payer: Self-pay | Admitting: Physical Therapy

## 2024-06-10 ENCOUNTER — Other Ambulatory Visit: Payer: Self-pay

## 2024-06-10 ENCOUNTER — Ambulatory Visit (INDEPENDENT_AMBULATORY_CARE_PROVIDER_SITE_OTHER): Admitting: Physical Therapy

## 2024-06-10 DIAGNOSIS — M6281 Muscle weakness (generalized): Secondary | ICD-10-CM

## 2024-06-10 DIAGNOSIS — G8929 Other chronic pain: Secondary | ICD-10-CM

## 2024-06-10 DIAGNOSIS — M79652 Pain in left thigh: Secondary | ICD-10-CM | POA: Diagnosis not present

## 2024-06-10 DIAGNOSIS — M5459 Other low back pain: Secondary | ICD-10-CM

## 2024-06-10 DIAGNOSIS — M25562 Pain in left knee: Secondary | ICD-10-CM

## 2024-06-10 DIAGNOSIS — M25552 Pain in left hip: Secondary | ICD-10-CM | POA: Diagnosis not present

## 2024-06-10 NOTE — Therapy (Signed)
 OUTPATIENT PHYSICAL THERAPY TREATMENT   Patient Name: Debbie [name] MRN: [id number] DOB:11-09-1955, 68 y.o., female Today's Date: 06/10/2024   END OF SESSION:  PT End of Session - 06/10/24 0804     Visit Number 7    Number of Visits 16    Date for Recertification  07/06/24    Authorization Type BCBS    PT Start Time 0800    PT Stop Time 0853    PT Time Calculation (min) 53 min    Activity Tolerance Patient tolerated treatment well    Behavior During Therapy [facility] for tasks assessed/performed                Past Medical History:  Diagnosis Date   Genital warts    Hyperlipidemia    Hypertension    Myocardial infarction [facility])    Peripheral arterial disease    Status post angioplasty with stent    Known femoral stent occlusion   Past Surgical History:  Procedure Laterality Date   ABDOMINAL AORTAGRAM N/A 08/09/2011   Procedure: ABDOMINAL EZELLA;  Surgeon: Carlin FORBES Haddock, MD;  Location: [facility] CATH LAB;  Service: Cardiovascular;  Laterality: N/A;   ANGIOPLASTY / STENTING FEMORAL  2008   Right femoral artery stenting done in [place]    COLONOSCOPY WITH PROPOFOL  N/A 06/11/2021   Procedure: COLONOSCOPY WITH PROPOFOL ;  Surgeon: Therisa Bi, MD;  Location: [facility] ENDOSCOPY;  Service: Gastroenterology;  Laterality: N/A;  2ND ARRIVAL, PLEASE   FEMORAL BYPASS Right 08/2011   Dr. Sherre at [facility]   Patient Active Problem List   Diagnosis Date Noted   Syncope and collapse 02/19/2024   Lumbar radiculopathy 01/07/2024   Colon cancer (HCC) 08/14/2021   Hypertension 11/28/2017   Vitamin D  deficiency 05/13/2012   Hypomagnesemia 11/11/2011   Hyperlipidemia 09/06/2011   Prediabetes 09/06/2011   PAD (peripheral artery disease) 08/01/2011   Pain in limb 08/01/2011   S/P angioplasty with stent 08/01/2011    PCP: Eliverto Bette Hover, MD  REFERRING PROVIDER: Joane Artist RAMAN, MD  REFERRING DIAG: Lumbar radiculopathy  Rationale for Evaluation and Treatment: Rehabilitation  THERAPY  DIAG:  Other low back pain  Pain in left hip  Pain in left thigh  Chronic pain of left knee  Muscle weakness (generalized)  ONSET DATE: Chronic   SUBJECTIVE:          SUBJECTIVE STATEMENT: Patient reports she has noticed an occasional feeling that something cold is going down the left leg, like something is dripping.   Eval: Patient reports chronic left hip and thigh pain, then in July she had a heart attack. She was discharged in August, and then when she returned home her left hip and knee were bothering her still. She does get some pain in her lower back with lifting heavy objects. The pain can radiate down the left thigh to the knee. The pain in the hip will bother her if she sleeps on her left side. She has been seeing a PT previously working on the left hip. She does use a walking stick occasionally if the left leg flares up. She does feel like when she walks she compensates so will use the walking stick. She does feel like she has made progress regarding the left hip because she has been walking with cardiac rehab and this has gotten much better. She continues to be limited primarily by the pain in the left thigh with walking and standing, and feels like she has to take pressure off the left leg. She does  do personal training 3x/week that focuses on her core, gait, and posture.  She is participating in cardiac rehab 3x/week and was seeing PT 2-3x/week.  PERTINENT HISTORY:  MI in June 2025  PAIN:  Are you having pain? Yes:  NPRS scale: 0/10 currently, 3-4/10 when pain occurs Pain location: Left thigh to the knee Pain description: Discomfort Aggravating factors: Walking, standing Relieving factors: Rest, stretching, exercise, using walking stick  PRECAUTIONS: Patient is currently participating in cardiac rehab for recent MI  PATIENT GOALS: Pain relief, improving walking and standing tolerance   OBJECTIVE:  Note: Objective measures were completed at Evaluation unless  otherwise noted. PATIENT SURVEYS:  PSFS: 5 Walking 22 laps on track / begin walking on treadmill without pain in left thigh/knee: 4 Standing for long periods due to left thigh pain: 5 Heavy lifting due to lower back: 6  MUSCLE LENGTH: Limitations with bilateral hamstring and hip flexor/quad  POSTURE:   Rounded shoulder posture  PALPATION: Tender to palpation lumbar with hypomobility noted with lumbar CPAs  LUMBAR ROM:   AROM eval  Flexion WFL  Extension 50%  Right lateral flexion   Left lateral flexion   Right rotation 75%  Left rotation 75%   (Blank rows = not tested)  LOWER EXTREMITY ROM:      Hip PROM grossly WFL and non-painful  LOWER EXTREMITY MMT:    MMT Right eval Left eval Left 06/10/2024  Hip flexion 4- 4-   Hip extension 4- 4-   Hip abduction 3+ 3 3  Hip adduction     Hip internal rotation     Hip external rotation     Knee flexion 5 5   Knee extension 5 5   Ankle dorsiflexion     Ankle plantarflexion     Ankle inversion     Ankle eversion      (Blank rows = not tested)  FUNCTIONAL TESTS:  30 sec stand test: 11 reps  GAIT: Assistive device utilized: None Level of assistance: Complete Independence Comments: Trendelenburg on left   TREATMENT  [facility] Adult PT Treatment:                                                DATE: 06/10/2024 Recumbent bike L4 x 8 min to improve endurance and workload capacity Bridge with black at knees 2 x 10 x 5 sec Sidelying hip abduction 3 x 10 left Goblet squat to table tap with 15# 3 x 10 Lateral 8 step-up-and-over 2 x 10  Wall sit 5 x 15 sec  Discussed her completion of cardiac rehab and continuing her cardiovascular exercises at the gym consisting of walking on the treadmill, using the rower machine, bike/stepper, and walking program.  PATIENT EDUCATION:  Education details: HEP Person educated: Patient Education method: Programmer, Multimedia, Demonstration, Actor cues, Verbal cues Education comprehension: verbalized  understanding, returned demonstration, verbal cues required, tactile cues required, and needs further education  HOME EXERCISE PROGRAM: Access Code: [email or web address]   ASSESSMENT: CLINICAL IMPRESSION: Patient tolerated therapy well with no adverse effects. Therapy continued to focus on strengthening for the hips and LE with good tolerance. Progressed with her hip strengthening this visit and she does still exhibit gross hip abductor strength deficit. She was able to tolerate progressing of step-ups and incorporate wall sit exercise for quad strengthening. She does require extended rest breaks to allow her HR  to recover after exercise. No changes made to her HEP this visit. Patient would benefit from continued skilled PT to progress mobility and strength in order to reduce pain and maximize functional ability.   Eval: Patient is a 68 y.o. female who was seen today for physical therapy evaluation and treatment for chronic left thigh and knee pain that do seem consistent with left lumbar radicular pain, but she also reports pain consistent with left GTPS and left knee pain. She does exhibit mobility deficit of the lumbar spine and gross strength deficit of the core and hip region. Extensive time spent discussing her current exercise and rehab routine as noted above. Patient demonstrated current core and hip exercises and provided patient quadruped pelvic tilt exercises to work of core neuromuscular control. She did report some lower back discomfort with supine core exercises likely due to weakness and mobility deficit, and reports left hip and thigh pain with hip abductor strengthening exercises. Patient would benefit from continued skilled PT to progress mobility and strength in order to reduce pain and maximize functional ability.  OBJECTIVE IMPAIRMENTS: Abnormal gait, decreased activity tolerance, decreased balance, decreased ROM, decreased strength, hypomobility, impaired flexibility, postural dysfunction, and  pain.   ACTIVITY LIMITATIONS: lifting, standing, sleeping, and locomotion level  PARTICIPATION LIMITATIONS: meal prep, cleaning, shopping, and community activity  PERSONAL FACTORS: Fitness, Past/current experiences, and Time since onset of injury/illness/exacerbation are also affecting patient's functional outcome.    GOALS: Goals reviewed with patient? Yes  SHORT TERM GOALS: Target date: 06/08/2024  Patient will be I with initial HEP in order to progress with therapy. Baseline: HEP provided at eval 06/08/2024: independent Goal status: MET  2.  Patient will report left thigh, hip, knee pain </= 2/10 in order to reduce functional limitations and improve walking tolerance Baseline: 4-5/10 06/08/2024: 3-4/10  Goal status: ONGOING  LONG TERM GOALS: Target date: 07/06/2024  Patient will be I with final HEP to maintain progress from PT. Baseline: HEP provided at eval Goal status: INITIAL  2.  Patient will report PSFS >/= 8 in order to indicate improvement in their functional ability. Baseline: 5 Goal status: INITIAL  3.  Patient will demonstrate gross hip strength >/= 4/5 MMT to improve her walking and standing tolerance Baseline: see limitations above Goal status: INITIAL  4.  Patient will perform 30 sec stand test >/= 15 reps to indicate improved strength and endurance for better walking and standing tolerance Baseline: 11 reps Goal status: INITIAL   PLAN: PT FREQUENCY: 1-2x/week  PT DURATION: 8 weeks  PLANNED INTERVENTIONS: 97164- PT Re-evaluation, 97750- Physical Performance Testing, 97110-Therapeutic exercises, 97530- Therapeutic activity, 97112- Neuromuscular re-education, 97535- Self Care, [id number]- Manual therapy, [id number]- Gait training, [phone] (1-2 muscles), 20561 (3+ muscles)- Dry Needling, Patient/Family education, Balance training, Joint mobilization, Joint manipulation, Spinal manipulation, Spinal mobilization, Cryotherapy, and Moist heat.  PLAN FOR NEXT SESSION:  Review HEP and progress PRN, manual for lumbar mobility, progress lumbar motion and stretching, progress hip and core strengthening/stabilization   Elaine Daring, PT, DPT, LAT, ATC 06/10/24  9:01 AM Phone: [phone] Fax: [phone]

## 2024-06-11 ENCOUNTER — Encounter

## 2024-06-14 ENCOUNTER — Ambulatory Visit: Admitting: Physical Therapy

## 2024-06-14 ENCOUNTER — Encounter

## 2024-06-14 ENCOUNTER — Telehealth: Payer: Self-pay | Admitting: Cardiology

## 2024-06-14 ENCOUNTER — Encounter: Payer: Self-pay | Admitting: Physical Therapy

## 2024-06-14 ENCOUNTER — Encounter: Admitting: Physical Therapy

## 2024-06-14 ENCOUNTER — Other Ambulatory Visit: Payer: Self-pay

## 2024-06-14 ENCOUNTER — Other Ambulatory Visit (HOSPITAL_BASED_OUTPATIENT_CLINIC_OR_DEPARTMENT_OTHER): Payer: Self-pay

## 2024-06-14 DIAGNOSIS — M25562 Pain in left knee: Secondary | ICD-10-CM | POA: Diagnosis not present

## 2024-06-14 DIAGNOSIS — M5459 Other low back pain: Secondary | ICD-10-CM | POA: Diagnosis not present

## 2024-06-14 DIAGNOSIS — G8929 Other chronic pain: Secondary | ICD-10-CM

## 2024-06-14 DIAGNOSIS — M25552 Pain in left hip: Secondary | ICD-10-CM

## 2024-06-14 DIAGNOSIS — M79652 Pain in left thigh: Secondary | ICD-10-CM

## 2024-06-14 DIAGNOSIS — M6281 Muscle weakness (generalized): Secondary | ICD-10-CM

## 2024-06-14 NOTE — Telephone Encounter (Signed)
 Nurse case manager called from [facility] to see if we could let the patient know they are still here to help as a resource. Please advise

## 2024-06-14 NOTE — Therapy (Signed)
 OUTPATIENT PHYSICAL THERAPY TREATMENT   Patient Name: Debbie [name] MRN: [id number] DOB:[birth year], 68 y.o., female Today's Date: 06/14/2024   END OF SESSION:  PT End of Session - 06/14/24 1106     Visit Number 8    Number of Visits 16    Date for Recertification  07/06/24    Authorization Type BCBS    PT Start Time 1106    PT Stop Time 1150    PT Time Calculation (min) 44 min    Activity Tolerance Patient tolerated treatment well    Behavior During Therapy WFL for tasks assessed/performed                 Past Medical History:  Diagnosis Date   Genital warts    Hyperlipidemia    Hypertension    Myocardial infarction (HCC)    Peripheral arterial disease    Status post angioplasty with stent    Known femoral stent occlusion   Past Surgical History:  Procedure Laterality Date   ABDOMINAL AORTAGRAM N/A 08/09/2011   Procedure: ABDOMINAL EZELLA;  Surgeon: Carlin FORBES Haddock, MD;  Location: [facility] CATH LAB;  Service: Cardiovascular;  Laterality: N/A;   ANGIOPLASTY / STENTING FEMORAL  2008   Right femoral artery stenting done in [place]    COLONOSCOPY WITH PROPOFOL  N/A 06/11/2021   Procedure: COLONOSCOPY WITH PROPOFOL ;  Surgeon: Therisa Bi, MD;  Location: [facility] ENDOSCOPY;  Service: Gastroenterology;  Laterality: N/A;  2ND ARRIVAL, PLEASE   FEMORAL BYPASS Right 08/2011   Dr[facility] Sherre at [facility]   Patient Active Problem List   Diagnosis Date Noted   Syncope and collapse 02/19/2024   Lumbar radiculopathy 01/07/2024   Colon cancer (HCC) 08/14/2021   Hypertension 11/28/2017   Vitamin D  deficiency 05/13/2012   Hypomagnesemia 11/11/2011   Hyperlipidemia 09/06/2011   Prediabetes 09/06/2011   PAD (peripheral artery disease) 08/01/2011   Pain in limb 08/01/2011   S/P angioplasty with stent 08/01/2011    PCP: Eliverto Bette Hover, MD  REFERRING PROVIDER: Joane Artist RAMAN, MD  REFERRING DIAG: Lumbar radiculopathy  Rationale for Evaluation and Treatment: Rehabilitation  THERAPY  DIAG:  Other low back pain  Pain in left hip  Pain in left thigh  Chronic pain of left knee  Muscle weakness (generalized)  ONSET DATE: Chronic   SUBJECTIVE:          SUBJECTIVE STATEMENT: Patient reports she suffered over the weekend due to her lower back, but then yesterday she felt great. Right now she is feeling some discomfort around the right hip. After her last appointment she went to the gym and walked on the treadmill and walked for 20 minutes and then did the rower for 20 minutes.   Eval: Patient reports chronic left hip and thigh pain, then in July she had a heart attack. She was discharged in August, and then when she returned home her left hip and knee were bothering her still. She does get some pain in her lower back with lifting heavy objects. The pain can radiate down the left thigh to the knee. The pain in the hip will bother her if she sleeps on her left side. She has been seeing a PT previously working on the left hip. She does use a walking stick occasionally if the left leg flares up. She does feel like when she walks she compensates so will use the walking stick. She does feel like she has made progress regarding the left hip because she has been walking with cardiac rehab and  this has gotten much better. She continues to be limited primarily by the pain in the left thigh with walking and standing, and feels like she has to take pressure off the left leg. She does do personal training 3x/week that focuses on her core, gait, and posture.  She is participating in cardiac rehab 3x/week and was seeing PT 2-3x/week.  PERTINENT HISTORY:  MI in June 2025  PAIN:  Are you having pain? Yes:  NPRS scale: 0/10 currently, 3-4/10 when pain occurs Pain location: Left thigh to the knee Pain description: Discomfort Aggravating factors: Walking, standing Relieving factors: Rest, stretching, exercise, using walking stick  PRECAUTIONS: Patient is currently participating in cardiac  rehab for recent MI  PATIENT GOALS: Pain relief, improving walking and standing tolerance   OBJECTIVE:  Note: Objective measures were completed at Evaluation unless otherwise noted. PATIENT SURVEYS:  PSFS: 5 Walking 22 laps on track / begin walking on treadmill without pain in left thigh/knee: 4 Standing for long periods due to left thigh pain: 5 Heavy lifting due to lower back: 6  MUSCLE LENGTH: Limitations with bilateral hamstring and hip flexor/quad  POSTURE:   Rounded shoulder posture  PALPATION: Tender to palpation lumbar with hypomobility noted with lumbar CPAs  LUMBAR ROM:   AROM eval  Flexion WFL  Extension 50%  Right lateral flexion   Left lateral flexion   Right rotation 75%  Left rotation 75%   (Blank rows = not tested)  LOWER EXTREMITY ROM:      Hip PROM grossly WFL and non-painful  LOWER EXTREMITY MMT:    MMT Right eval Left eval Left 06/10/2024  Hip flexion 4- 4-   Hip extension 4- 4-   Hip abduction 3+ 3 3  Hip adduction     Hip internal rotation     Hip external rotation     Knee flexion 5 5   Knee extension 5 5   Ankle dorsiflexion     Ankle plantarflexion     Ankle inversion     Ankle eversion      (Blank rows = not tested)  FUNCTIONAL TESTS:  30 sec stand test: 11 reps  GAIT: Assistive device utilized: None Level of assistance: Complete Independence Comments: Trendelenburg on left   TREATMENT  [facility] Adult PT Treatment:                                                DATE: 06/14/2024 Piriformis stretch x 20 sec each LTR with legs crossed x 5 each Seated piriformis stretch Deadlift with 20# 3 x 10 Cat cow x 5 Bird dog 2 x 10 Wall sit 5 x 15 sec Bent over row with 15# 3 x 10 each  Discussed amount of exercise she is doing and modifying exercise at gym to gradually build up activity tolerance and reduce DOMS.  PATIENT EDUCATION:  Education details: HEP Person educated: Patient Education method: Solicitor,  Actor cues, Verbal cues Education comprehension: verbalized understanding, returned demonstration, verbal cues required, tactile cues required, and needs further education  HOME EXERCISE PROGRAM: Access Code: [email or web address]   ASSESSMENT: CLINICAL IMPRESSION: Patient tolerated therapy well with no adverse effects. Therapy focused on stretching for the lower back/hips and progressing her core and back strength with good tolerance. Reduced weight of deadlifts as patient believes this contributes to back soreness, but she was able to  progress with other core and upper back strengthening exercises. No specific changes made to HEP but discussed activity level at gym and with trainer. Patient would benefit from continued skilled PT to progress mobility and strength in order to reduce pain and maximize functional ability.   Eval: Patient is a 68 y.o. female who was seen today for physical therapy evaluation and treatment for chronic left thigh and knee pain that do seem consistent with left lumbar radicular pain, but she also reports pain consistent with left GTPS and left knee pain. She does exhibit mobility deficit of the lumbar spine and gross strength deficit of the core and hip region. Extensive time spent discussing her current exercise and rehab routine as noted above. Patient demonstrated current core and hip exercises and provided patient quadruped pelvic tilt exercises to work of core neuromuscular control. She did report some lower back discomfort with supine core exercises likely due to weakness and mobility deficit, and reports left hip and thigh pain with hip abductor strengthening exercises. Patient would benefit from continued skilled PT to progress mobility and strength in order to reduce pain and maximize functional ability.  OBJECTIVE IMPAIRMENTS: Abnormal gait, decreased activity tolerance, decreased balance, decreased ROM, decreased strength, hypomobility, impaired flexibility, postural  dysfunction, and pain.   ACTIVITY LIMITATIONS: lifting, standing, sleeping, and locomotion level  PARTICIPATION LIMITATIONS: meal prep, cleaning, shopping, and community activity  PERSONAL FACTORS: Fitness, Past/current experiences, and Time since onset of injury/illness/exacerbation are also affecting patient's functional outcome.    GOALS: Goals reviewed with patient? Yes  SHORT TERM GOALS: Target date: 06/08/2024  Patient will be I with initial HEP in order to progress with therapy. Baseline: HEP provided at eval 06/08/2024: independent Goal status: MET  2.  Patient will report left thigh, hip, knee pain </= 2/10 in order to reduce functional limitations and improve walking tolerance Baseline: 4-5/10 06/08/2024: 3-4/10  Goal status: ONGOING  LONG TERM GOALS: Target date: 07/06/2024  Patient will be I with final HEP to maintain progress from PT. Baseline: HEP provided at eval Goal status: INITIAL  2.  Patient will report PSFS >/= 8 in order to indicate improvement in their functional ability. Baseline: 5 Goal status: INITIAL  3.  Patient will demonstrate gross hip strength >/= 4/5 MMT to improve her walking and standing tolerance Baseline: see limitations above Goal status: INITIAL  4.  Patient will perform 30 sec stand test >/= 15 reps to indicate improved strength and endurance for better walking and standing tolerance Baseline: 11 reps Goal status: INITIAL   PLAN: PT FREQUENCY: 1-2x/week  PT DURATION: 8 weeks  PLANNED INTERVENTIONS: 97164- PT Re-evaluation, 97750- Physical Performance Testing, 97110-Therapeutic exercises, 97530- Therapeutic activity, 97112- Neuromuscular re-education, 97535- Self Care, 02859- Manual therapy, [id number]- Gait training, [phone] (1-2 muscles), 20561 (3+ muscles)- Dry Needling, Patient/Family education, Balance training, Joint mobilization, Joint manipulation, Spinal manipulation, Spinal mobilization, Cryotherapy, and Moist heat.  PLAN FOR  NEXT SESSION: Review HEP and progress PRN, manual for lumbar mobility, progress lumbar motion and stretching, progress hip and core strengthening/stabilization   Elaine Daring, PT, DPT, LAT, ATC 06/14/24  12:25 PM Phone: [phone] Fax: [phone]

## 2024-06-16 ENCOUNTER — Ambulatory Visit: Admitting: Physical Therapy

## 2024-06-16 ENCOUNTER — Other Ambulatory Visit: Payer: Self-pay

## 2024-06-16 ENCOUNTER — Ambulatory Visit

## 2024-06-16 ENCOUNTER — Encounter: Payer: Self-pay | Admitting: Physical Therapy

## 2024-06-16 DIAGNOSIS — M25552 Pain in left hip: Secondary | ICD-10-CM

## 2024-06-16 DIAGNOSIS — G8929 Other chronic pain: Secondary | ICD-10-CM

## 2024-06-16 DIAGNOSIS — M5459 Other low back pain: Secondary | ICD-10-CM

## 2024-06-16 DIAGNOSIS — M25562 Pain in left knee: Secondary | ICD-10-CM

## 2024-06-16 DIAGNOSIS — M79652 Pain in left thigh: Secondary | ICD-10-CM

## 2024-06-16 DIAGNOSIS — M6281 Muscle weakness (generalized): Secondary | ICD-10-CM

## 2024-06-16 NOTE — Therapy (Signed)
 OUTPATIENT PHYSICAL THERAPY TREATMENT   Patient Name: Debbie [name] MRN: [id number] DOB:Nov 07, 1955, 68 y.o., female Today's Date: 06/16/2024   END OF SESSION:  PT End of Session - 06/16/24 0839     Visit Number 9    Number of Visits 16    Date for Recertification  07/06/24    Authorization Type BCBS    PT Start Time 0840    PT Stop Time 0925    PT Time Calculation (min) 45 min    Activity Tolerance Patient tolerated treatment well    Behavior During Therapy [facility] for tasks assessed/performed                  Past Medical History:  Diagnosis Date   Genital warts    Hyperlipidemia    Hypertension    Myocardial infarction [facility])    Peripheral arterial disease    Status post angioplasty with stent    Known femoral stent occlusion   Past Surgical History:  Procedure Laterality Date   ABDOMINAL AORTAGRAM N/A 08/09/2011   Procedure: ABDOMINAL EZELLA;  Surgeon: Carlin FORBES Haddock, MD;  Location: [facility] CATH LAB;  Service: Cardiovascular;  Laterality: N/A;   ANGIOPLASTY / STENTING FEMORAL  2008   Right femoral artery stenting done in [place]    COLONOSCOPY WITH PROPOFOL  N/A 06/11/2021   Procedure: COLONOSCOPY WITH PROPOFOL ;  Surgeon: Therisa Bi, MD;  Location: [facility] ENDOSCOPY;  Service: Gastroenterology;  Laterality: N/A;  2ND ARRIVAL, PLEASE   FEMORAL BYPASS Right 08/2011   Dr. Sherre at [facility]   Patient Active Problem List   Diagnosis Date Noted   Syncope and collapse 02/19/2024   Lumbar radiculopathy 01/07/2024   Colon cancer (HCC) 08/14/2021   Hypertension 11/28/2017   Vitamin D  deficiency 05/13/2012   Hypomagnesemia 11/11/2011   Hyperlipidemia 09/06/2011   Prediabetes 09/06/2011   PAD (peripheral artery disease) 08/01/2011   Pain in limb 08/01/2011   S/P angioplasty with stent 08/01/2011    PCP: Eliverto Bette Hover, MD  REFERRING PROVIDER: Joane Artist RAMAN, MD  REFERRING DIAG: Lumbar radiculopathy  Rationale for Evaluation and Treatment:  Rehabilitation  THERAPY DIAG:  Other low back pain  Pain in left hip  Pain in left thigh  Chronic pain of left knee  Muscle weakness (generalized)  ONSET DATE: Chronic   SUBJECTIVE:          SUBJECTIVE STATEMENT: Patient reports she is feeling good, she hasn't had any real discomfort, maybe some mild. She states yesterday she fine walking around and no pain at night. She does have personal training later today.   Eval: Patient reports chronic left hip and thigh pain, then in July she had a heart attack. She was discharged in August, and then when she returned home her left hip and knee were bothering her still. She does get some pain in her lower back with lifting heavy objects. The pain can radiate down the left thigh to the knee. The pain in the hip will bother her if she sleeps on her left side. She has been seeing a PT previously working on the left hip. She does use a walking stick occasionally if the left leg flares up. She does feel like when she walks she compensates so will use the walking stick. She does feel like she has made progress regarding the left hip because she has been walking with cardiac rehab and this has gotten much better. She continues to be limited primarily by the pain in the left thigh with walking and  standing, and feels like she has to take pressure off the left leg. She does do personal training 3x/week that focuses on her core, gait, and posture.  She is participating in cardiac rehab 3x/week and was seeing PT 2-3x/week.  PERTINENT HISTORY:  MI in June 2025  PAIN:  Are you having pain? Yes:  NPRS scale: 0/10 currently, 3-4/10 when pain occurs Pain location: Left thigh to the knee Pain description: Discomfort Aggravating factors: Walking, standing Relieving factors: Rest, stretching, exercise, using walking stick  PRECAUTIONS: Patient is currently participating in cardiac rehab for recent MI  PATIENT GOALS: Pain relief, improving walking and standing  tolerance   OBJECTIVE:  Note: Objective measures were completed at Evaluation unless otherwise noted. PATIENT SURVEYS:  PSFS: 5 Walking 22 laps on track / begin walking on treadmill without pain in left thigh/knee: 4 Standing for long periods due to left thigh pain: 5 Heavy lifting due to lower back: 6  MUSCLE LENGTH: Limitations with bilateral hamstring and hip flexor/quad  POSTURE:   Rounded shoulder posture  PALPATION: Tender to palpation lumbar with hypomobility noted with lumbar CPAs  LUMBAR ROM:   AROM eval  Flexion WFL  Extension 50%  Right lateral flexion   Left lateral flexion   Right rotation 75%  Left rotation 75%   (Blank rows = not tested)  LOWER EXTREMITY ROM:      Hip PROM grossly WFL and non-painful  LOWER EXTREMITY MMT:    MMT Right eval Left eval Left 06/10/2024  Hip flexion 4- 4-   Hip extension 4- 4-   Hip abduction 3+ 3 3  Hip adduction     Hip internal rotation     Hip external rotation     Knee flexion 5 5   Knee extension 5 5   Ankle dorsiflexion     Ankle plantarflexion     Ankle inversion     Ankle eversion      (Blank rows = not tested)  FUNCTIONAL TESTS:  30 sec stand test: 11 reps  GAIT: Assistive device utilized: None Level of assistance: Complete Independence Comments: Trendelenburg on left   TREATMENT  [facility] Adult PT Treatment:                                                DATE: 06/16/2024 Treadmill walking 2.0 mph x 10 min to improve endurance and workload capacity TRX alternating reverse lunge 2 x 10 each TRX row 2 x 10 Deadlift with 25# 3 x 10 Forward alternating 8 runner step-ups holding 5# bilat 3 x 8 each Lateral band walk with blue at mid-shin 3 x 20 down/back  PATIENT EDUCATION:  Education details: HEP Person educated: Patient Education method: Programmer, Multimedia, Demonstration, Tactile cues, Verbal cues Education comprehension: verbalized understanding, returned demonstration, verbal cues required, tactile  cues required, and needs further education  HOME EXERCISE PROGRAM: Access Code: [email or web address]   ASSESSMENT: CLINICAL IMPRESSION: Patient tolerated therapy well with no adverse effects. Therapy continues to focus on progressing her strengthening and activity tolerance with walking. Incorporated treadmill walking at her comfortable pace with good tolerance. She did report mild right hip discomfort with walking but states it was better and tolerable. She was able to tolerate progression in LE and hip strengthening without report of increased pain. Incorporated TRX row to improve glute and back strengthening maintaining good posture and lumbar  control. She did require occasional extended rest breaks to allow her HR to recover between sets. No changes made to HEP this visit. Patient would benefit from continued skilled PT to progress mobility and strength in order to reduce pain and maximize functional ability.   Eval: Patient is a 68 y.o. female who was seen today for physical therapy evaluation and treatment for chronic left thigh and knee pain that do seem consistent with left lumbar radicular pain, but she also reports pain consistent with left GTPS and left knee pain. She does exhibit mobility deficit of the lumbar spine and gross strength deficit of the core and hip region. Extensive time spent discussing her current exercise and rehab routine as noted above. Patient demonstrated current core and hip exercises and provided patient quadruped pelvic tilt exercises to work of core neuromuscular control. She did report some lower back discomfort with supine core exercises likely due to weakness and mobility deficit, and reports left hip and thigh pain with hip abductor strengthening exercises. Patient would benefit from continued skilled PT to progress mobility and strength in order to reduce pain and maximize functional ability.  OBJECTIVE IMPAIRMENTS: Abnormal gait, decreased activity tolerance, decreased  balance, decreased ROM, decreased strength, hypomobility, impaired flexibility, postural dysfunction, and pain.   ACTIVITY LIMITATIONS: lifting, standing, sleeping, and locomotion level  PARTICIPATION LIMITATIONS: meal prep, cleaning, shopping, and community activity  PERSONAL FACTORS: Fitness, Past/current experiences, and Time since onset of injury/illness/exacerbation are also affecting patient's functional outcome.    GOALS: Goals reviewed with patient? Yes  SHORT TERM GOALS: Target date: 06/08/2024  Patient will be I with initial HEP in order to progress with therapy. Baseline: HEP provided at eval 06/08/2024: independent Goal status: MET  2.  Patient will report left thigh, hip, knee pain </= 2/10 in order to reduce functional limitations and improve walking tolerance Baseline: 4-5/10 06/08/2024: 3-4/10  Goal status: ONGOING  LONG TERM GOALS: Target date: 07/06/2024  Patient will be I with final HEP to maintain progress from PT. Baseline: HEP provided at eval Goal status: INITIAL  2.  Patient will report PSFS >/= 8 in order to indicate improvement in their functional ability. Baseline: 5 Goal status: INITIAL  3.  Patient will demonstrate gross hip strength >/= 4/5 MMT to improve her walking and standing tolerance Baseline: see limitations above Goal status: INITIAL  4.  Patient will perform 30 sec stand test >/= 15 reps to indicate improved strength and endurance for better walking and standing tolerance Baseline: 11 reps Goal status: INITIAL   PLAN: PT FREQUENCY: 1-2x/week  PT DURATION: 8 weeks  PLANNED INTERVENTIONS: 97164- PT Re-evaluation, 97750- Physical Performance Testing, 97110-Therapeutic exercises, 97530- Therapeutic activity, 97112- Neuromuscular re-education, 97535- Self Care, 02859- Manual therapy, [id number]- Gait training, [phone] (1-2 muscles), 20561 (3+ muscles)- Dry Needling, Patient/Family education, Balance training, Joint mobilization, Joint  manipulation, Spinal manipulation, Spinal mobilization, Cryotherapy, and Moist heat.  PLAN FOR NEXT SESSION: Review HEP and progress PRN, manual for lumbar mobility, progress lumbar motion and stretching, progress hip and core strengthening/stabilization   Elaine Daring, PT, DPT, LAT, ATC 06/16/24  9:30 AM Phone: [phone] Fax: [phone]

## 2024-06-18 ENCOUNTER — Encounter

## 2024-06-21 ENCOUNTER — Ambulatory Visit

## 2024-06-21 ENCOUNTER — Ambulatory Visit: Payer: PRIVATE HEALTH INSURANCE | Admitting: Clinical

## 2024-06-21 ENCOUNTER — Encounter: Payer: Self-pay | Admitting: Physical Therapy

## 2024-06-21 ENCOUNTER — Other Ambulatory Visit: Payer: Self-pay

## 2024-06-21 ENCOUNTER — Ambulatory Visit: Admitting: Physical Therapy

## 2024-06-21 DIAGNOSIS — G8929 Other chronic pain: Secondary | ICD-10-CM

## 2024-06-21 DIAGNOSIS — F4320 Adjustment disorder, unspecified: Secondary | ICD-10-CM

## 2024-06-21 DIAGNOSIS — M6281 Muscle weakness (generalized): Secondary | ICD-10-CM | POA: Diagnosis not present

## 2024-06-21 DIAGNOSIS — M5459 Other low back pain: Secondary | ICD-10-CM | POA: Diagnosis not present

## 2024-06-21 DIAGNOSIS — M25552 Pain in left hip: Secondary | ICD-10-CM | POA: Diagnosis not present

## 2024-06-21 DIAGNOSIS — M25562 Pain in left knee: Secondary | ICD-10-CM | POA: Diagnosis not present

## 2024-06-21 DIAGNOSIS — M79652 Pain in left thigh: Secondary | ICD-10-CM | POA: Diagnosis not present

## 2024-06-21 NOTE — Progress Notes (Signed)
   Darice Seats, LCSW

## 2024-06-21 NOTE — Therapy (Signed)
 OUTPATIENT PHYSICAL THERAPY TREATMENT   Patient Name: Debbie [name] MRN: [id number] DOB:Jul 04, 1956, 68 y.o., female Today's Date: 06/21/2024   END OF SESSION:  PT End of Session - 06/21/24 1020     Visit Number 10    Number of Visits 16    Date for Recertification  07/06/24    Authorization Type BCBS    PT Start Time 1015    PT Stop Time 1100    PT Time Calculation (min) 45 min    Activity Tolerance Patient tolerated treatment well    Behavior During Therapy WFL for tasks assessed/performed                   Past Medical History:  Diagnosis Date   Genital warts    Hyperlipidemia    Hypertension    Myocardial infarction (HCC)    Peripheral arterial disease    Status post angioplasty with stent    Known femoral stent occlusion   Past Surgical History:  Procedure Laterality Date   ABDOMINAL AORTAGRAM N/A 08/09/2011   Procedure: ABDOMINAL EZELLA;  Surgeon: Carlin FORBES Haddock, MD;  Location: [facility] CATH LAB;  Service: Cardiovascular;  Laterality: N/A;   ANGIOPLASTY / STENTING FEMORAL  2008   Right femoral artery stenting done in [place]    COLONOSCOPY WITH PROPOFOL  N/A 06/11/2021   Procedure: COLONOSCOPY WITH PROPOFOL ;  Surgeon: Therisa Bi, MD;  Location: [facility] ENDOSCOPY;  Service: Gastroenterology;  Laterality: N/A;  2ND ARRIVAL, PLEASE   FEMORAL BYPASS Right 08/2011   Dr. Sherre at [facility]   Patient Active Problem List   Diagnosis Date Noted   Syncope and collapse 02/19/2024   Lumbar radiculopathy 01/07/2024   Colon cancer (HCC) 08/14/2021   Hypertension 11/28/2017   Vitamin D  deficiency 05/13/2012   Hypomagnesemia 11/11/2011   Hyperlipidemia 09/06/2011   Prediabetes 09/06/2011   PAD (peripheral artery disease) 08/01/2011   Pain in limb 08/01/2011   S/P angioplasty with stent 08/01/2011    PCP: Eliverto Bette Hover, MD  REFERRING PROVIDER: Joane Artist RAMAN, MD  REFERRING DIAG: Lumbar radiculopathy  Rationale for Evaluation and Treatment:  Rehabilitation  THERAPY DIAG:  Other low back pain  Pain in left hip  Pain in left thigh  Chronic pain of left knee  Muscle weakness (generalized)  ONSET DATE: Chronic   SUBJECTIVE:          SUBJECTIVE STATEMENT: Patient reports she had a really good weekend. She stretched a lot and went to the mall to walk without any issue. She states she has not had any of the left thigh pain / tingling recently, but she has had some pain focused at the front of the left knee. She does report she can shower and cook her meal without having to sit so can stand up more now.   Eval: Patient reports chronic left hip and thigh pain, then in July she had a heart attack. She was discharged in August, and then when she returned home her left hip and knee were bothering her still. She does get some pain in her lower back with lifting heavy objects. The pain can radiate down the left thigh to the knee. The pain in the hip will bother her if she sleeps on her left side. She has been seeing a PT previously working on the left hip. She does use a walking stick occasionally if the left leg flares up. She does feel like when she walks she compensates so will use the walking stick. She does feel like  she has made progress regarding the left hip because she has been walking with cardiac rehab and this has gotten much better. She continues to be limited primarily by the pain in the left thigh with walking and standing, and feels like she has to take pressure off the left leg. She does do personal training 3x/week that focuses on her core, gait, and posture.  She is participating in cardiac rehab 3x/week and was seeing PT 2-3x/week.  PERTINENT HISTORY:  MI in June 2025  PAIN:  Are you having pain? Yes:  NPRS scale: 0/10 currently, 3-4/10 when pain occurs Pain location: Left thigh to the knee Pain description: Discomfort Aggravating factors: Walking, standing Relieving factors: Rest, stretching, exercise, using walking  stick  PRECAUTIONS: Patient is currently participating in cardiac rehab for recent MI  PATIENT GOALS: Pain relief, improving walking and standing tolerance   OBJECTIVE:  Note: Objective measures were completed at Evaluation unless otherwise noted. PATIENT SURVEYS:  PSFS: 5 Walking 22 laps on track / begin walking on treadmill without pain in left thigh/knee: 4 Standing for long periods due to left thigh pain: 5 Heavy lifting due to lower back: 6  MUSCLE LENGTH: Limitations with bilateral hamstring and hip flexor/quad  POSTURE:   Rounded shoulder posture  PALPATION: Tender to palpation lumbar with hypomobility noted with lumbar CPAs  LUMBAR ROM:   AROM eval  Flexion WFL  Extension 50%  Right lateral flexion   Left lateral flexion   Right rotation 75%  Left rotation 75%   (Blank rows = not tested)  LOWER EXTREMITY ROM:      Hip PROM grossly WFL and non-painful  LOWER EXTREMITY MMT:    MMT Right eval Left eval Left 06/10/2024  Hip flexion 4- 4-   Hip extension 4- 4-   Hip abduction 3+ 3 3  Hip adduction     Hip internal rotation     Hip external rotation     Knee flexion 5 5   Knee extension 5 5   Ankle dorsiflexion     Ankle plantarflexion     Ankle inversion     Ankle eversion      (Blank rows = not tested)  FUNCTIONAL TESTS:  30 sec stand test: 11 reps  GAIT: Assistive device utilized: None Level of assistance: Complete Independence Comments: Trendelenburg on left   TREATMENT  [facility] Adult PT Treatment:                                                DATE: 06/21/2024 Treadmill walking 2.0 mph x 10 min to improve endurance and workload capacity Deadlift with 25# 3 x 10 LAQ with 10# 3 x 15 each Standing hip abduction and extension with green at knees 2 x 15 each Bird dog 2 x 10 x 5 sec  PATIENT EDUCATION:  Education details: HEP Person educated: Patient Education method: Programmer, Multimedia, Demonstration, Actor cues, Verbal cues Education  comprehension: verbalized understanding, returned demonstration, verbal cues required, tactile cues required, and needs further education  HOME EXERCISE PROGRAM: Access Code: [email or web address]   ASSESSMENT: CLINICAL IMPRESSION: Patient tolerated therapy well with no adverse effects. Therapy continues to focus on progressing her strengthening and activity tolerance with walking. Continued to focus primarily on core, hip, and knee strengthening with good tolerance. She did not report any pain with therapy this visit and seems to  be improving with her walking. No changes made to HEP this visit.  Patient would benefit from continued skilled PT to progress mobility and strength in order to reduce pain and maximize functional ability.   Eval: Patient is a 68 y.o. female who was seen today for physical therapy evaluation and treatment for chronic left thigh and knee pain that do seem consistent with left lumbar radicular pain, but she also reports pain consistent with left GTPS and left knee pain. She does exhibit mobility deficit of the lumbar spine and gross strength deficit of the core and hip region. Extensive time spent discussing her current exercise and rehab routine as noted above. Patient demonstrated current core and hip exercises and provided patient quadruped pelvic tilt exercises to work of core neuromuscular control. She did report some lower back discomfort with supine core exercises likely due to weakness and mobility deficit, and reports left hip and thigh pain with hip abductor strengthening exercises. Patient would benefit from continued skilled PT to progress mobility and strength in order to reduce pain and maximize functional ability.  OBJECTIVE IMPAIRMENTS: Abnormal gait, decreased activity tolerance, decreased balance, decreased ROM, decreased strength, hypomobility, impaired flexibility, postural dysfunction, and pain.   ACTIVITY LIMITATIONS: lifting, standing, sleeping, and locomotion  level  PARTICIPATION LIMITATIONS: meal prep, cleaning, shopping, and community activity  PERSONAL FACTORS: Fitness, Past/current experiences, and Time since onset of injury/illness/exacerbation are also affecting patient's functional outcome.    GOALS: Goals reviewed with patient? Yes  SHORT TERM GOALS: Target date: 06/08/2024  Patient will be I with initial HEP in order to progress with therapy. Baseline: HEP provided at eval 06/08/2024: independent Goal status: MET  2.  Patient will report left thigh, hip, knee pain </= 2/10 in order to reduce functional limitations and improve walking tolerance Baseline: 4-5/10 06/08/2024: 3-4/10  Goal status: ONGOING  LONG TERM GOALS: Target date: 07/06/2024  Patient will be I with final HEP to maintain progress from PT. Baseline: HEP provided at eval Goal status: INITIAL  2.  Patient will report PSFS >/= 8 in order to indicate improvement in their functional ability. Baseline: 5 Goal status: INITIAL  3.  Patient will demonstrate gross hip strength >/= 4/5 MMT to improve her walking and standing tolerance Baseline: see limitations above Goal status: INITIAL  4.  Patient will perform 30 sec stand test >/= 15 reps to indicate improved strength and endurance for better walking and standing tolerance Baseline: 11 reps Goal status: INITIAL   PLAN: PT FREQUENCY: 1-2x/week  PT DURATION: 8 weeks  PLANNED INTERVENTIONS: 97164- PT Re-evaluation, 97750- Physical Performance Testing, 97110-Therapeutic exercises, 97530- Therapeutic activity, 97112- Neuromuscular re-education, 97535- Self Care, 02859- Manual therapy, [id number]- Gait training, [phone] (1-2 muscles), 20561 (3+ muscles)- Dry Needling, Patient/Family education, Balance training, Joint mobilization, Joint manipulation, Spinal manipulation, Spinal mobilization, Cryotherapy, and Moist heat.  PLAN FOR NEXT SESSION: Review HEP and progress PRN, manual for lumbar mobility, progress lumbar motion  and stretching, progress hip and core strengthening/stabilization   Elaine Daring, PT, DPT, LAT, ATC 06/21/2024  11:04 AM Phone: [phone] Fax: [phone]

## 2024-06-21 NOTE — Progress Notes (Signed)
 [facility] Counselor/Therapist Progress Note  Patient ID: Debbie [name], MRN: [id number],    Date: 06/21/2024  Time Spent: 2:34pm - 3:18pm : 44 minutes   Treatment Type: Individual Therapy  Reported Symptoms: none reported  Mental Status Exam: Appearance:  Neat and Well Groomed     Behavior: Appropriate  Motor: Normal  Speech/Language:  Clear and Coherent and Normal Rate  Affect: Appropriate  Mood: normal  Thought process: normal  Thought content:   WNL  Sensory/Perceptual disturbances:   WNL  Orientation: oriented to person, place, time/date, and situation  Attention: Fair  Concentration: Good  Memory: WNL  Fund of knowledge:  Good  Insight:   Good  Judgment:  Good  Impulse Control: Good   Risk Assessment: Danger to Self:  No Patient denied current suicidal ideation  Self-injurious Behavior: No Danger to Others: No Patient denied current homicidal ideation Duty to Warn:no Physical Aggression / Violence:No  Access to Firearms a concern: No  Gang Involvement:No   Subjective: Patient reported patient completed cardiac rehab and recently met with cardiologist to discuss returning to work. Patient reported patient will be returning to work July 12, 2024 and will be returning without restrictions. Patient reported patient has asked to meet with leadership to discuss patient's transition back to work. Patient reported meeting with leadership on Wednesday to discuss patient's return to work. Patient reported patient plans to discuss the need for patient to continue therapies and discuss patient's work hours. Patient stated, I will no longer be able to work those long hours. Patient stated, I need to set myself a schedule to make sure I don't live at the church and take care of me. Patient stated, boundaries for me comes with a schedule. Patient stated, know when to use good judgement and when I can be flexible in reference to boundaries.  Patient stated, to  ensure I'm not over extended in reference to programs that rely on patient, taking time off, and attending retreats. Patient stated, I feel good about going back to work. Patient stated, my concern is to make sure I'm committed and consistent to [place]'s health. Patient stated, my accountability partners in reference to strategies to maintain boundaries and reported patient's daughter, senior warden, and volunteers at church are patient's accountability partners. Patient stated, good in response to mood since last session.   Interventions: Cognitive Behavioral Therapy. Clinician conducted session via caregility video from clinician's home office. Patient provided verbal consent to proceed with telehealth session and is aware of limitations of telephone or video visits. Patient participated in session from patient's home. Reviewed events since last session and assessed for changes. Explored patient's thoughts/feelings regarding returning to work and upcoming meeting with engineer, technical sales. Explored and identified boundaries patient would like to establish upon returning to work. Discussed strategies to maintain boundaries.    Collaboration of Care: Other not required at this time   Diagnosis:  Adjustment disorder, unspecified type     Plan: Patient is to utilize Cognitive Behavioral Therapy, effective communication, and coping strategies to decrease symptoms associated with their diagnosis. Frequency: bi-weekly  Modality: individual      Long-term goal:   Increase use of self care strategies from 5 days per week to 7 days per week  Target Date: 10/03/24  Progress: progressing    Practice effective communication strategies for patient to utilize when expressing her thoughts and feelings to others and establishing boundaries in a controlled and assertive way  Target Date: 10/03/24  Progress: progressing  Short-term goal:  Verbally express an understanding of the  relationship between concern/worry related to patient's recent medical symptoms/events and their impact on thinking patterns and behaviors. Target Date: 10/03/24  Progress: progressing                    Darice Seats, LCSW

## 2024-06-23 ENCOUNTER — Encounter: Admitting: Physical Therapy

## 2024-06-23 ENCOUNTER — Ambulatory Visit

## 2024-06-23 ENCOUNTER — Encounter: Payer: Self-pay | Admitting: Cardiology

## 2024-06-24 ENCOUNTER — Encounter: Payer: Self-pay | Admitting: Physical Therapy

## 2024-06-24 ENCOUNTER — Ambulatory Visit: Admitting: Physical Therapy

## 2024-06-24 ENCOUNTER — Other Ambulatory Visit: Payer: Self-pay

## 2024-06-24 DIAGNOSIS — M25562 Pain in left knee: Secondary | ICD-10-CM | POA: Diagnosis not present

## 2024-06-24 DIAGNOSIS — M6281 Muscle weakness (generalized): Secondary | ICD-10-CM | POA: Diagnosis not present

## 2024-06-24 DIAGNOSIS — M79652 Pain in left thigh: Secondary | ICD-10-CM

## 2024-06-24 DIAGNOSIS — G8929 Other chronic pain: Secondary | ICD-10-CM | POA: Diagnosis not present

## 2024-06-24 DIAGNOSIS — M5459 Other low back pain: Secondary | ICD-10-CM

## 2024-06-24 DIAGNOSIS — M25552 Pain in left hip: Secondary | ICD-10-CM | POA: Diagnosis not present

## 2024-06-24 NOTE — Therapy (Signed)
 OUTPATIENT PHYSICAL THERAPY TREATMENT   Patient Name: Debbie [name] MRN: [id number] DOB:04-26-1956, 68 y.o., female Today's Date: 06/24/2024   END OF SESSION:  PT End of Session - 06/24/24 0933     Visit Number 11    Number of Visits 16    Date for Recertification  07/06/24    Authorization Type BCBS    PT Start Time 0930    PT Stop Time 1015    PT Time Calculation (min) 45 min    Activity Tolerance Patient tolerated treatment well    Behavior During Therapy WFL for tasks assessed/performed                    Past Medical History:  Diagnosis Date   Genital warts    Hyperlipidemia    Hypertension    Myocardial infarction (HCC)    Peripheral arterial disease    Status post angioplasty with stent    Known femoral stent occlusion   Past Surgical History:  Procedure Laterality Date   ABDOMINAL AORTAGRAM N/A 08/09/2011   Procedure: ABDOMINAL EZELLA;  Surgeon: Carlin FORBES Haddock, MD;  Location: [facility] CATH LAB;  Service: Cardiovascular;  Laterality: N/A;   ANGIOPLASTY / STENTING FEMORAL  2008   Right femoral artery stenting done in [place]    COLONOSCOPY WITH PROPOFOL  N/A 06/11/2021   Procedure: COLONOSCOPY WITH PROPOFOL ;  Surgeon: Therisa Bi, MD;  Location: [facility] ENDOSCOPY;  Service: Gastroenterology;  Laterality: N/A;  2ND ARRIVAL, PLEASE   FEMORAL BYPASS Right 08/2011   Dr. Sherre at [facility]   Patient Active Problem List   Diagnosis Date Noted   Syncope and collapse 02/19/2024   Lumbar radiculopathy 01/07/2024   Colon cancer (HCC) 08/14/2021   Hypertension 11/28/2017   Vitamin D  deficiency 05/13/2012   Hypomagnesemia 11/11/2011   Hyperlipidemia 09/06/2011   Prediabetes 09/06/2011   PAD (peripheral artery disease) 08/01/2011   Pain in limb 08/01/2011   S/P angioplasty with stent 08/01/2011    PCP: Eliverto Bette Hover, MD  REFERRING PROVIDER: Joane Artist RAMAN, MD  REFERRING DIAG: Lumbar radiculopathy  Rationale for Evaluation and Treatment:  Rehabilitation  THERAPY DIAG:  Other low back pain  Pain in left hip  Pain in left thigh  Chronic pain of left knee  Muscle weakness (generalized)  ONSET DATE: Chronic   SUBJECTIVE:          SUBJECTIVE STATEMENT: Patient reports she met with work related staff to discuss her schedule returning to work. She states she has been feeling fine, she has no issues sleeping. 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 She continues to do her stretches in the morning and at night. She denies any pain in the morning.  Eval: Patient reports chronic left hip and thigh pain, then in July she had a heart attack. She was discharged in August, and then when she returned home her left hip and knee were bothering her still. She does get some pain in her lower back with lifting heavy objects. The pain can radiate down the left thigh to the knee. The pain in the hip will bother her if she sleeps on her left side. She has been seeing a PT previously working on the left hip. She does use a walking stick occasionally if the left leg flares up. She does feel like when she walks she compensates so will use the walking stick. She does feel like she has made progress regarding the left hip because  she has been walking with cardiac rehab and this has gotten much better. She continues to be limited primarily by the pain in the left thigh with walking and standing, and feels like she has to take pressure off the left leg. She does do personal training 3x/week that focuses on her core, gait, and posture.  She is participating in cardiac rehab 3x/week and was seeing PT 2-3x/week.  PERTINENT HISTORY:  MI in June 2025  PAIN:  Are you having pain? Yes:  NPRS scale: 0/10 currently, 3-4/10 when pain occurs Pain location: Left thigh to the knee Pain description: Discomfort Aggravating factors: Walking, standing Relieving factors: Rest, stretching, exercise, using walking  stick  PRECAUTIONS: Patient is currently participating in cardiac rehab for recent MI  PATIENT GOALS: Pain relief, improving walking and standing tolerance   OBJECTIVE:  Note: Objective measures were completed at Evaluation unless otherwise noted. PATIENT SURVEYS:  PSFS: 5 Walking 22 laps on track / begin walking on treadmill without pain in left thigh/knee: 4 Standing for long periods due to left thigh pain: 5 Heavy lifting due to lower back: 6  MUSCLE LENGTH: Limitations with bilateral hamstring and hip flexor/quad  POSTURE:   Rounded shoulder posture  PALPATION: Tender to palpation lumbar with hypomobility noted with lumbar CPAs  LUMBAR ROM:   AROM eval  Flexion WFL  Extension 50%  Right lateral flexion   Left lateral flexion   Right rotation 75%  Left rotation 75%   (Blank rows = not tested)  LOWER EXTREMITY ROM:      Hip PROM grossly WFL and non-painful  LOWER EXTREMITY MMT:    MMT Right eval Left eval Left 06/10/2024  Hip flexion 4- 4-   Hip extension 4- 4-   Hip abduction 3+ 3 3  Hip adduction     Hip internal rotation     Hip external rotation     Knee flexion 5 5   Knee extension 5 5   Ankle dorsiflexion     Ankle plantarflexion     Ankle inversion     Ankle eversion      (Blank rows = not tested)  FUNCTIONAL TESTS:  30 sec stand test: 11 reps  GAIT: Assistive device utilized: None Level of assistance: Complete Independence Comments: Trendelenburg on left   TREATMENT  [facility] Adult PT Treatment:                                                DATE: 06/24/2024 Treadmill walking beginning at 2.4 mph and gradually increasing to 3.0 mph as patient felt comfortable, x 10 min to improve endurance and workload capacity Side clamshell with blue 3 x 15 left LAQ with 10# 3 x 15 each Deadlift with 30# 3 x 12  Time spent discussing her return to work and possible challenges  PATIENT EDUCATION:  Education details: HEP Person educated:  Patient Education method: Programmer, Multimedia, Facilities Manager, Actor cues, Verbal cues Education comprehension: verbalized understanding, returned demonstration, verbal cues required, tactile cues required, and needs further education  HOME EXERCISE PROGRAM: Access Code: [email or web address]   ASSESSMENT: CLINICAL IMPRESSION: Patient tolerated therapy well with no adverse effects. Therapy focused on progressing her hip and LE strength and improving her walking tolerance. She was able to progress with banded hip strengthening and with her deadlifts. She does seem to be progressing well and improving with activity  tolerance, and reporting minimal pain. No changes made to HEP this visit. Patient would benefit from continued skilled PT to progress mobility and strength in order to reduce pain and maximize functional ability.   Eval: Patient is a 68 y.o. female who was seen today for physical therapy evaluation and treatment for chronic left thigh and knee pain that do seem consistent with left lumbar radicular pain, but she also reports pain consistent with left GTPS and left knee pain. She does exhibit mobility deficit of the lumbar spine and gross strength deficit of the core and hip region. Extensive time spent discussing her current exercise and rehab routine as noted above. Patient demonstrated current core and hip exercises and provided patient quadruped pelvic tilt exercises to work of core neuromuscular control. She did report some lower back discomfort with supine core exercises likely due to weakness and mobility deficit, and reports left hip and thigh pain with hip abductor strengthening exercises. Patient would benefit from continued skilled PT to progress mobility and strength in order to reduce pain and maximize functional ability.  OBJECTIVE IMPAIRMENTS: Abnormal gait, decreased activity tolerance, decreased balance, decreased ROM, decreased strength, hypomobility, impaired flexibility, postural dysfunction,  and pain.   ACTIVITY LIMITATIONS: lifting, standing, sleeping, and locomotion level  PARTICIPATION LIMITATIONS: meal prep, cleaning, shopping, and community activity  PERSONAL FACTORS: Fitness, Past/current experiences, and Time since onset of injury/illness/exacerbation are also affecting patient's functional outcome.    GOALS: Goals reviewed with patient? Yes  SHORT TERM GOALS: Target date: 06/08/2024  Patient will be I with initial HEP in order to progress with therapy. Baseline: HEP provided at eval 06/08/2024: independent Goal status: MET  2.  Patient will report left thigh, hip, knee pain </= 2/10 in order to reduce functional limitations and improve walking tolerance Baseline: 4-5/10 06/08/2024: 3-4/10  Goal status: ONGOING  LONG TERM GOALS: Target date: 07/06/2024  Patient will be I with final HEP to maintain progress from PT. Baseline: HEP provided at eval Goal status: INITIAL  2.  Patient will report PSFS >/= 8 in order to indicate improvement in their functional ability. Baseline: 5 Goal status: INITIAL  3.  Patient will demonstrate gross hip strength >/= 4/5 MMT to improve her walking and standing tolerance Baseline: see limitations above Goal status: INITIAL  4.  Patient will perform 30 sec stand test >/= 15 reps to indicate improved strength and endurance for better walking and standing tolerance Baseline: 11 reps Goal status: INITIAL   PLAN: PT FREQUENCY: 1-2x/week  PT DURATION: 8 weeks  PLANNED INTERVENTIONS: 97164- PT Re-evaluation, 97750- Physical Performance Testing, 97110-Therapeutic exercises, 97530- Therapeutic activity, 97112- Neuromuscular re-education, 97535- Self Care, 02859- Manual therapy, [id number]- Gait training, [id number] (1-2 muscles), 20561 (3+ muscles)- Dry Needling, Patient/Family education, Balance training, Joint mobilization, Joint manipulation, Spinal manipulation, Spinal mobilization, Cryotherapy, and Moist heat.  PLAN FOR NEXT SESSION:  Review HEP and progress PRN, manual for lumbar mobility, progress lumbar motion and stretching, progress hip and core strengthening/stabilization   Elaine Daring, PT, DPT, LAT, ATC 06/24/2024  1:21 PM Phone: [phone] Fax: [phone]

## 2024-06-25 ENCOUNTER — Encounter

## 2024-06-28 ENCOUNTER — Encounter: Payer: Self-pay | Admitting: Family Medicine

## 2024-06-28 ENCOUNTER — Ambulatory Visit: Admitting: Physical Therapy

## 2024-06-28 ENCOUNTER — Ambulatory Visit: Admitting: Family Medicine

## 2024-06-28 ENCOUNTER — Encounter: Payer: Self-pay | Admitting: Physical Therapy

## 2024-06-28 ENCOUNTER — Encounter

## 2024-06-28 ENCOUNTER — Other Ambulatory Visit: Payer: Self-pay

## 2024-06-28 VITALS — BP 154/80 | HR 63 | Ht 65.5 in

## 2024-06-28 DIAGNOSIS — M25552 Pain in left hip: Secondary | ICD-10-CM | POA: Diagnosis not present

## 2024-06-28 DIAGNOSIS — M6281 Muscle weakness (generalized): Secondary | ICD-10-CM | POA: Diagnosis not present

## 2024-06-28 DIAGNOSIS — M79652 Pain in left thigh: Secondary | ICD-10-CM | POA: Diagnosis not present

## 2024-06-28 DIAGNOSIS — G8929 Other chronic pain: Secondary | ICD-10-CM

## 2024-06-28 DIAGNOSIS — M5459 Other low back pain: Secondary | ICD-10-CM

## 2024-06-28 DIAGNOSIS — M25562 Pain in left knee: Secondary | ICD-10-CM | POA: Diagnosis not present

## 2024-06-28 DIAGNOSIS — M5416 Radiculopathy, lumbar region: Secondary | ICD-10-CM

## 2024-06-28 NOTE — Therapy (Signed)
 " OUTPATIENT PHYSICAL THERAPY TREATMENT   Patient Name: Debbie [name] MRN: [id number] DOB:10/20/1955, 68 y.o., female Today's Date: 06/28/2024   END OF SESSION:  PT End of Session - 06/28/24 1123     Visit Number 12    Number of Visits 16    Date for Recertification  07/06/24    Authorization Type BCBS    PT Start Time 1104    PT Stop Time 1158    PT Time Calculation (min) 54 min    Activity Tolerance Patient tolerated treatment well    Behavior During Therapy WFL for tasks assessed/performed                     Past Medical History:  Diagnosis Date   Genital warts    Hyperlipidemia    Hypertension    Myocardial infarction (HCC)    Peripheral arterial disease    Status post angioplasty with stent    Known femoral stent occlusion   Past Surgical History:  Procedure Laterality Date   ABDOMINAL AORTAGRAM N/A 08/09/2011   Procedure: ABDOMINAL EZELLA;  Surgeon: Carlin FORBES Haddock, MD;  Location: [facility] CATH LAB;  Service: Cardiovascular;  Laterality: N/A;   ANGIOPLASTY / STENTING FEMORAL  2008   Right femoral artery stenting done in [place]    COLONOSCOPY WITH PROPOFOL  N/A 06/11/2021   Procedure: COLONOSCOPY WITH PROPOFOL ;  Surgeon: Therisa Bi, MD;  Location: [facility] ENDOSCOPY;  Service: Gastroenterology;  Laterality: N/A;  2ND ARRIVAL, PLEASE   FEMORAL BYPASS Right 08/2011   Dr. Sherre at [facility]   Patient Active Problem List   Diagnosis Date Noted   Syncope and collapse 02/19/2024   Lumbar radiculopathy 01/07/2024   Colon cancer (HCC) 08/14/2021   Hypertension 11/28/2017   Vitamin D  deficiency 05/13/2012   Hypomagnesemia 11/11/2011   Hyperlipidemia 09/06/2011   Prediabetes 09/06/2011   PAD (peripheral artery disease) 08/01/2011   Pain in limb 08/01/2011   S/P angioplasty with stent 08/01/2011    PCP: Eliverto Bette Hover, MD  REFERRING PROVIDER: Joane Artist RAMAN, MD  REFERRING DIAG: Lumbar radiculopathy  Rationale for Evaluation and Treatment:  Rehabilitation  THERAPY DIAG:  Other low back pain  Pain in left hip  Pain in left thigh  Chronic pain of left knee  Muscle weakness (generalized)  ONSET DATE: Chronic   SUBJECTIVE:          SUBJECTIVE STATEMENT: Patient reports she is doing well. She did have a little pain in the front of the left knee this morning but she does report no pain at the moment. She does feel like her left leg is still weaker than the right and would like to continue building her strength for return to work. She did get a massage on Saturday that focused more on her lower back and hips.  Eval: Patient reports chronic left hip and thigh pain, then in July she had a heart attack. She was discharged in August, and then when she returned home her left hip and knee were bothering her still. She does get some pain in her lower back with lifting heavy objects. The pain can radiate down the left thigh to the knee. The pain in the hip will bother her if she sleeps on her left side. She has been seeing a PT previously working on the left hip. She does use a walking stick occasionally if the left leg flares up. She does feel like when she walks she compensates so will use the walking stick. She does  feel like she has made progress regarding the left hip because she has been walking with cardiac rehab and this has gotten much better. She continues to be limited primarily by the pain in the left thigh with walking and standing, and feels like she has to take pressure off the left leg. She does do personal training 3x/week that focuses on her core, gait, and posture.  She is participating in cardiac rehab 3x/week and was seeing PT 2-3x/week.  PERTINENT HISTORY:  MI in June 2025  PAIN:  Are you having pain? Yes:  NPRS scale: 0/10 currently, 3-4/10 when pain occurs Pain location: Left thigh to the knee Pain description: Discomfort Aggravating factors: Walking, standing Relieving factors: Rest, stretching, exercise, using  walking stick  PRECAUTIONS: Patient is currently participating in cardiac rehab for recent MI  PATIENT GOALS: Pain relief, improving walking and standing tolerance   OBJECTIVE:  Note: Objective measures were completed at Evaluation unless otherwise noted. PATIENT SURVEYS:  PSFS: 5 Walking 22 laps on track / begin walking on treadmill without pain in left thigh/knee: 4 Standing for long periods due to left thigh pain: 5 Heavy lifting due to lower back: 6  MUSCLE LENGTH: Limitations with bilateral hamstring and hip flexor/quad  POSTURE:   Rounded shoulder posture  PALPATION: Tender to palpation lumbar with hypomobility noted with lumbar CPAs  LUMBAR ROM:   AROM eval  Flexion WFL  Extension 50%  Right lateral flexion   Left lateral flexion   Right rotation 75%  Left rotation 75%   (Blank rows = not tested)  LOWER EXTREMITY ROM:      Hip PROM grossly WFL and non-painful  LOWER EXTREMITY MMT:    MMT Right eval Left eval Left 06/10/2024  Hip flexion 4- 4-   Hip extension 4- 4-   Hip abduction 3+ 3 3  Hip adduction     Hip internal rotation     Hip external rotation     Knee flexion 5 5   Knee extension 5 5   Ankle dorsiflexion     Ankle plantarflexion     Ankle inversion     Ankle eversion      (Blank rows = not tested)  FUNCTIONAL TESTS:  30 sec stand test: 11 reps  GAIT: Assistive device utilized: None Level of assistance: Complete Independence Comments: Trendelenburg on left   TREATMENT  [facility] Adult PT Treatment:                                                DATE: 06/28/2024 Treadmill walking beginning at 2.5 mph and gradually increasing to 3.1 mph as patient felt comfortable, x 10 min to improve endurance and workload capacity Deadlift with 30# 3 x 12 Forward 10 step-up 3 x 10 each Wall squat with stability ball 3 x 10 TRX reverse lunge 3 x 10 each LAQ with 12# 3 x 12 each  PATIENT EDUCATION:  Education details: HEP Person educated:  Patient Education method: Programmer, Multimedia, Demonstration, Tactile cues, Verbal cues Education comprehension: verbalized understanding, returned demonstration, verbal cues required, tactile cues required, and needs further education  HOME EXERCISE PROGRAM: Access Code: [email or web address]   ASSESSMENT: CLINICAL IMPRESSION: Patient tolerated therapy well with no adverse effects. Therapy focused on progressing her walking tolerance and LE strengthening with good tolerance. She did not report any increase in pain with therapy today.  She does exhibit greater difficulty with step-ups on the left. No changes made to her HEP. Patient would benefit from continued skilled PT to progress mobility and strength in order to reduce pain and maximize functional ability.   Eval: Patient is a 68 y.o. female who was seen today for physical therapy evaluation and treatment for chronic left thigh and knee pain that do seem consistent with left lumbar radicular pain, but she also reports pain consistent with left GTPS and left knee pain. She does exhibit mobility deficit of the lumbar spine and gross strength deficit of the core and hip region. Extensive time spent discussing her current exercise and rehab routine as noted above. Patient demonstrated current core and hip exercises and provided patient quadruped pelvic tilt exercises to work of core neuromuscular control. She did report some lower back discomfort with supine core exercises likely due to weakness and mobility deficit, and reports left hip and thigh pain with hip abductor strengthening exercises. Patient would benefit from continued skilled PT to progress mobility and strength in order to reduce pain and maximize functional ability.  OBJECTIVE IMPAIRMENTS: Abnormal gait, decreased activity tolerance, decreased balance, decreased ROM, decreased strength, hypomobility, impaired flexibility, postural dysfunction, and pain.   ACTIVITY LIMITATIONS: lifting, standing, sleeping,  and locomotion level  PARTICIPATION LIMITATIONS: meal prep, cleaning, shopping, and community activity  PERSONAL FACTORS: Fitness, Past/current experiences, and Time since onset of injury/illness/exacerbation are also affecting patient's functional outcome.    GOALS: Goals reviewed with patient? Yes  SHORT TERM GOALS: Target date: 06/08/2024  Patient will be I with initial HEP in order to progress with therapy. Baseline: HEP provided at eval 06/08/2024: independent Goal status: MET  2.  Patient will report left thigh, hip, knee pain </= 2/10 in order to reduce functional limitations and improve walking tolerance Baseline: 4-5/10 06/08/2024: 3-4/10  Goal status: ONGOING  LONG TERM GOALS: Target date: 07/06/2024  Patient will be I with final HEP to maintain progress from PT. Baseline: HEP provided at eval Goal status: INITIAL  2.  Patient will report PSFS >/= 8 in order to indicate improvement in their functional ability. Baseline: 5 Goal status: INITIAL  3.  Patient will demonstrate gross hip strength >/= 4/5 MMT to improve her walking and standing tolerance Baseline: see limitations above Goal status: INITIAL  4.  Patient will perform 30 sec stand test >/= 15 reps to indicate improved strength and endurance for better walking and standing tolerance Baseline: 11 reps Goal status: INITIAL   PLAN: PT FREQUENCY: 1-2x/week  PT DURATION: 8 weeks  PLANNED INTERVENTIONS: 97164- PT Re-evaluation, 97750- Physical Performance Testing, 97110-Therapeutic exercises, 97530- Therapeutic activity, 97112- Neuromuscular re-education, 97535- Self Care, 02859- Manual therapy, [id number]- Gait training, [phone] (1-2 muscles), 20561 (3+ muscles)- Dry Needling, Patient/Family education, Balance training, Joint mobilization, Joint manipulation, Spinal manipulation, Spinal mobilization, Cryotherapy, and Moist heat.  PLAN FOR NEXT SESSION: Review HEP and progress PRN, manual for lumbar mobility, progress  lumbar motion and stretching, progress hip and core strengthening/stabilization   Elaine Daring, PT, DPT, LAT, ATC 06/28/2024  12:00 PM Phone: [phone] Fax: [phone]  "

## 2024-06-28 NOTE — Patient Instructions (Addendum)
 Thank you for coming in today.   So glad that you are feeling better!  Continue Home Exercise Program.   See you back as needed.

## 2024-06-28 NOTE — Progress Notes (Signed)
"       ° °  I, Leotis Batter, CMA acting as a scribe for Artist Lloyd, MD.  Debbie [name] is a 68 y.o. female who presents to [facility] today for 58-month f/u lumbar radiculopathy and L hip pain. Pt was last seen by Dr. Lloyd on 05/04/24 and was referred to PT, completing 11 visits.  Today, pt reports being pain free. Massage and PT have been extremely beneficial for sx.   Dx imaging: 01/03/24 L-spine MRI 12/23/23 L hip XR 12/27/23 L hip MRI              12/23/23 L-spine XR  Pertinent review of systems: No fever or chills  Relevant historical information: Hypertension.  History of sudden cardiac collapse.   Exam:  BP (!) 154/80   Pulse 63   Ht 5' 5.5 (1.664 m)   LMP 07/09/2007   SpO2 100%   BMI 31.24 kg/m  General: Well Developed, well nourished, and in no acute distress.   MSK: L-spine normal lumbar motion normal gait.    Lab and Radiology Results No results found for this or any previous visit (from the past 72 hours). No results found.     Assessment and Plan: 68 y.o. female with lumbar radiculopathy involving left leg much improved with PT.  Check back as needed.  It will be about midway in 2026 before should be able to have an epidural steroid injection due to Brilinta  and drug-eluting stent.  Fortunately she does not need an injection right now because her symptoms are well-controlled with PT.  Plan to continue PT exercises and check back with me as needed.   PDMP not reviewed this encounter. No orders of the defined types were placed in this encounter.  No orders of the defined types were placed in this encounter.    Discussed warning signs or symptoms. Please see discharge instructions. Patient expresses understanding.   The above documentation has been reviewed and is accurate and complete Artist Lloyd, M.D.  Total encounter time 20 minutes including face-to-face time with the patient and, reviewing past medical record, and charting  on the date of service.     "

## 2024-06-29 ENCOUNTER — Ambulatory Visit
Admission: RE | Admit: 2024-06-29 | Discharge: 2024-06-29 | Disposition: A | Discharge status: Home / Self Care | Source: Ambulatory Visit | Attending: Obstetrics & Gynecology | Admitting: Obstetrics & Gynecology

## 2024-06-29 ENCOUNTER — Encounter: Payer: Self-pay | Admitting: Physical Therapy

## 2024-06-29 ENCOUNTER — Encounter: Admitting: Physical Therapy

## 2024-06-29 ENCOUNTER — Other Ambulatory Visit: Payer: Self-pay

## 2024-06-29 ENCOUNTER — Ambulatory Visit: Admitting: Physical Therapy

## 2024-06-29 DIAGNOSIS — M25562 Pain in left knee: Secondary | ICD-10-CM

## 2024-06-29 DIAGNOSIS — Z1231 Encounter for screening mammogram for malignant neoplasm of breast: Secondary | ICD-10-CM | POA: Diagnosis present

## 2024-06-29 DIAGNOSIS — M5459 Other low back pain: Secondary | ICD-10-CM

## 2024-06-29 DIAGNOSIS — M79652 Pain in left thigh: Secondary | ICD-10-CM

## 2024-06-29 DIAGNOSIS — G8929 Other chronic pain: Secondary | ICD-10-CM | POA: Diagnosis not present

## 2024-06-29 DIAGNOSIS — M6281 Muscle weakness (generalized): Secondary | ICD-10-CM | POA: Diagnosis not present

## 2024-06-29 DIAGNOSIS — E2839 Other primary ovarian failure: Secondary | ICD-10-CM

## 2024-06-29 DIAGNOSIS — M25552 Pain in left hip: Secondary | ICD-10-CM

## 2024-06-29 NOTE — Therapy (Signed)
 " OUTPATIENT PHYSICAL THERAPY TREATMENT   Patient Name: Debbie [name] MRN: [id number] DOB:1955-10-20, 68 y.o., female Today's Date: 06/29/2024   END OF SESSION:  PT End of Session - 06/29/24 1522     Visit Number 13    Number of Visits 16    Date for Recertification  07/06/24    Authorization Type BCBS    PT Start Time 1518    PT Stop Time 1600    PT Time Calculation (min) 42 min    Activity Tolerance Patient tolerated treatment well    Behavior During Therapy WFL for tasks assessed/performed                      Past Medical History:  Diagnosis Date   Genital warts    Hyperlipidemia    Hypertension    Myocardial infarction (HCC)    Peripheral arterial disease    Status post angioplasty with stent    Known femoral stent occlusion   Past Surgical History:  Procedure Laterality Date   ABDOMINAL AORTAGRAM N/A 08/09/2011   Procedure: ABDOMINAL EZELLA;  Surgeon: Carlin FORBES Haddock, MD;  Location: [facility] CATH LAB;  Service: Cardiovascular;  Laterality: N/A;   ANGIOPLASTY / STENTING FEMORAL  2008   Right femoral artery stenting done in [place]    COLONOSCOPY WITH PROPOFOL  N/A 06/11/2021   Procedure: COLONOSCOPY WITH PROPOFOL ;  Surgeon: Therisa Bi, MD;  Location: [facility] ENDOSCOPY;  Service: Gastroenterology;  Laterality: N/A;  2ND ARRIVAL, PLEASE   FEMORAL BYPASS Right 08/2011   Dr. Sherre at [facility]   Patient Active Problem List   Diagnosis Date Noted   Syncope and collapse 02/19/2024   Lumbar radiculopathy 01/07/2024   Colon cancer (HCC) 08/14/2021   Hypertension 11/28/2017   Vitamin D  deficiency 05/13/2012   Hypomagnesemia 11/11/2011   Hyperlipidemia 09/06/2011   Prediabetes 09/06/2011   PAD (peripheral artery disease) 08/01/2011   Pain in limb 08/01/2011   S/P angioplasty with stent 08/01/2011    PCP: Eliverto Bette Hover, MD  REFERRING PROVIDER: Joane Artist RAMAN, MD  REFERRING DIAG: Lumbar radiculopathy  Rationale for Evaluation and Treatment:  Rehabilitation  THERAPY DIAG:  Other low back pain  Pain in left hip  Pain in left thigh  Chronic pain of left knee  Muscle weakness (generalized)  ONSET DATE: Chronic   SUBJECTIVE:          SUBJECTIVE STATEMENT: Patient reports she is doing well. She did see her trainer earlier today.  Eval: Patient reports chronic left hip and thigh pain, then in July she had a heart attack. She was discharged in August, and then when she returned home her left hip and knee were bothering her still. She does get some pain in her lower back with lifting heavy objects. The pain can radiate down the left thigh to the knee. The pain in the hip will bother her if she sleeps on her left side. She has been seeing a PT previously working on the left hip. She does use a walking stick occasionally if the left leg flares up. She does feel like when she walks she compensates so will use the walking stick. She does feel like she has made progress regarding the left hip because she has been walking with cardiac rehab and this has gotten much better. She continues to be limited primarily by the pain in the left thigh with walking and standing, and feels like she has to take pressure off the left leg. She does do personal  training 3x/week that focuses on her core, gait, and posture.  She is participating in cardiac rehab 3x/week and was seeing PT 2-3x/week.  PERTINENT HISTORY:  MI in June 2025  PAIN:  Are you having pain? Yes:  NPRS scale: 0/10 currently, 3-4/10 when pain occurs Pain location: Left thigh to the knee Pain description: Discomfort Aggravating factors: Walking, standing Relieving factors: Rest, stretching, exercise, using walking stick  PRECAUTIONS: Patient is currently participating in cardiac rehab for recent MI  PATIENT GOALS: Pain relief, improving walking and standing tolerance   OBJECTIVE:  Note: Objective measures were completed at Evaluation unless otherwise noted. PATIENT SURVEYS:   PSFS: 5 Walking 22 laps on track / begin walking on treadmill without pain in left thigh/knee: 4 Standing for long periods due to left thigh pain: 5 Heavy lifting due to lower back: 6  MUSCLE LENGTH: Limitations with bilateral hamstring and hip flexor/quad  POSTURE:   Rounded shoulder posture  PALPATION: Tender to palpation lumbar with hypomobility noted with lumbar CPAs  LUMBAR ROM:   AROM eval  Flexion WFL  Extension 50%  Right lateral flexion   Left lateral flexion   Right rotation 75%  Left rotation 75%   (Blank rows = not tested)  LOWER EXTREMITY ROM:      Hip PROM grossly WFL and non-painful  LOWER EXTREMITY MMT:    MMT Right eval Left eval Left 06/10/2024  Hip flexion 4- 4-   Hip extension 4- 4-   Hip abduction 3+ 3 3  Hip adduction     Hip internal rotation     Hip external rotation     Knee flexion 5 5   Knee extension 5 5   Ankle dorsiflexion     Ankle plantarflexion     Ankle inversion     Ankle eversion      (Blank rows = not tested)  FUNCTIONAL TESTS:  30 sec stand test: 11 reps  GAIT: Assistive device utilized: None Level of assistance: Complete Independence Comments: Trendelenburg on left   TREATMENT  [facility] Adult PT Treatment:                                                DATE: 06/29/2024 Treadmill walking beginning at 2.5 mph and gradually increasing to 3.1 mph as patient felt comfortable, x 10 min to improve endurance and workload capacity Bridge holding and clamshell with blue at knees 6 x 5 Side clamshell with blue 3 x 15 Lateral band walk with blue at mid shin 3 x 20 down/back TRX squat 3 x 10 SLS 5 x 20 sec each SLS with pattern assist using blue band 2 x 30 sec each  PATIENT EDUCATION:  Education details: HEP Person educated: Patient Education method: Programmer, Multimedia, Demonstration, Tactile cues, Verbal cues Education comprehension: verbalized understanding, returned demonstration, verbal cues required, tactile cues required,  and needs further education  HOME EXERCISE PROGRAM: Access Code: [email or web address]   ASSESSMENT: CLINICAL IMPRESSION: Patient tolerated therapy well with no adverse effects. Therapy continues to focus on progressing her left hip strengthening and incorporated more balance training in this visit. She does exhibit continued difficulty with her single leg balance so used pattern assist with better control. No changes made to HEP this visit but did encourage patient to continue walking program and working on her single leg balance. Patient would benefit from continued skilled  PT to progress mobility and strength in order to reduce pain and maximize functional ability.   Eval: Patient is a 68 y.o. female who was seen today for physical therapy evaluation and treatment for chronic left thigh and knee pain that do seem consistent with left lumbar radicular pain, but she also reports pain consistent with left GTPS and left knee pain. She does exhibit mobility deficit of the lumbar spine and gross strength deficit of the core and hip region. Extensive time spent discussing her current exercise and rehab routine as noted above. Patient demonstrated current core and hip exercises and provided patient quadruped pelvic tilt exercises to work of core neuromuscular control. She did report some lower back discomfort with supine core exercises likely due to weakness and mobility deficit, and reports left hip and thigh pain with hip abductor strengthening exercises. Patient would benefit from continued skilled PT to progress mobility and strength in order to reduce pain and maximize functional ability.  OBJECTIVE IMPAIRMENTS: Abnormal gait, decreased activity tolerance, decreased balance, decreased ROM, decreased strength, hypomobility, impaired flexibility, postural dysfunction, and pain.   ACTIVITY LIMITATIONS: lifting, standing, sleeping, and locomotion level  PARTICIPATION LIMITATIONS: meal prep, cleaning, shopping, and  community activity  PERSONAL FACTORS: Fitness, Past/current experiences, and Time since onset of injury/illness/exacerbation are also affecting patient's functional outcome.    GOALS: Goals reviewed with patient? Yes  SHORT TERM GOALS: Target date: 06/08/2024  Patient will be I with initial HEP in order to progress with therapy. Baseline: HEP provided at eval 06/08/2024: independent Goal status: MET  2.  Patient will report left thigh, hip, knee pain </= 2/10 in order to reduce functional limitations and improve walking tolerance Baseline: 4-5/10 06/08/2024: 3-4/10  Goal status: ONGOING  LONG TERM GOALS: Target date: 07/06/2024  Patient will be I with final HEP to maintain progress from PT. Baseline: HEP provided at eval Goal status: INITIAL  2.  Patient will report PSFS >/= 8 in order to indicate improvement in their functional ability. Baseline: 5 Goal status: INITIAL  3.  Patient will demonstrate gross hip strength >/= 4/5 MMT to improve her walking and standing tolerance Baseline: see limitations above Goal status: INITIAL  4.  Patient will perform 30 sec stand test >/= 15 reps to indicate improved strength and endurance for better walking and standing tolerance Baseline: 11 reps Goal status: INITIAL   PLAN: PT FREQUENCY: 1-2x/week  PT DURATION: 8 weeks  PLANNED INTERVENTIONS: 97164- PT Re-evaluation, 97750- Physical Performance Testing, 97110-Therapeutic exercises, 97530- Therapeutic activity, 97112- Neuromuscular re-education, 97535- Self Care, [id number]- Manual therapy, [id number]- Gait training, [id number] (1-2 muscles), 20561 (3+ muscles)- Dry Needling, Patient/Family education, Balance training, Joint mobilization, Joint manipulation, Spinal manipulation, Spinal mobilization, Cryotherapy, and Moist heat.  PLAN FOR NEXT SESSION: Review HEP and progress PRN, manual for lumbar mobility, progress lumbar motion and stretching, progress hip and core  strengthening/stabilization   Elaine Daring, PT, DPT, LAT, ATC 06/29/2024  4:12 PM Phone: [phone] Fax: [phone]  "

## 2024-06-30 ENCOUNTER — Encounter

## 2024-07-05 ENCOUNTER — Ambulatory Visit (INDEPENDENT_AMBULATORY_CARE_PROVIDER_SITE_OTHER): Admitting: Physical Therapy

## 2024-07-05 ENCOUNTER — Other Ambulatory Visit: Payer: Self-pay

## 2024-07-05 ENCOUNTER — Encounter: Payer: Self-pay | Admitting: Physical Therapy

## 2024-07-05 ENCOUNTER — Ambulatory Visit

## 2024-07-05 DIAGNOSIS — M25552 Pain in left hip: Secondary | ICD-10-CM

## 2024-07-05 DIAGNOSIS — G8929 Other chronic pain: Secondary | ICD-10-CM

## 2024-07-05 DIAGNOSIS — M5459 Other low back pain: Secondary | ICD-10-CM

## 2024-07-05 DIAGNOSIS — M79652 Pain in left thigh: Secondary | ICD-10-CM

## 2024-07-05 DIAGNOSIS — M6281 Muscle weakness (generalized): Secondary | ICD-10-CM

## 2024-07-05 DIAGNOSIS — M25562 Pain in left knee: Secondary | ICD-10-CM | POA: Diagnosis not present

## 2024-07-05 NOTE — Therapy (Signed)
 " OUTPATIENT PHYSICAL THERAPY TREATMENT   Patient Name: Debbie [name] MRN: [id number] DOB:October 01, 1955, 68 y.o., female Today's Date: 07/05/2024   END OF SESSION:  PT End of Session - 07/05/24 0934     Visit Number 14    Number of Visits 25    Date for Recertification  08/30/24    Authorization Type BCBS    PT Start Time 0932    PT Stop Time 1015    PT Time Calculation (min) 43 min    Activity Tolerance Patient tolerated treatment well    Behavior During Therapy WFL for tasks assessed/performed                       Past Medical History:  Diagnosis Date   Genital warts    Hyperlipidemia    Hypertension    Myocardial infarction [facility])    Peripheral arterial disease    Status post angioplasty with stent    Known femoral stent occlusion   Past Surgical History:  Procedure Laterality Date   ABDOMINAL AORTAGRAM N/A 08/09/2011   Procedure: ABDOMINAL EZELLA;  Surgeon: Carlin FORBES Haddock, MD;  Location: [facility] CATH LAB;  Service: Cardiovascular;  Laterality: N/A;   ANGIOPLASTY / STENTING FEMORAL  2008   Right femoral artery stenting done in [place]    COLONOSCOPY WITH PROPOFOL  N/A 06/11/2021   Procedure: COLONOSCOPY WITH PROPOFOL ;  Surgeon: Therisa Bi, MD;  Location: [facility] ENDOSCOPY;  Service: Gastroenterology;  Laterality: N/A;  2ND ARRIVAL, PLEASE   FEMORAL BYPASS Right 08/2011   Dr. Sherre at [facility]   Patient Active Problem List   Diagnosis Date Noted   Syncope and collapse 02/19/2024   Lumbar radiculopathy 01/07/2024   Colon cancer (HCC) 08/14/2021   Hypertension 11/28/2017   Vitamin D  deficiency 05/13/2012   Hypomagnesemia 11/11/2011   Hyperlipidemia 09/06/2011   Prediabetes 09/06/2011   PAD (peripheral artery disease) 08/01/2011   Pain in limb 08/01/2011   S/P angioplasty with stent 08/01/2011    PCP: Eliverto Bette Hover, MD  REFERRING PROVIDER: Joane Artist RAMAN, MD  REFERRING DIAG: Lumbar radiculopathy  Rationale for Evaluation and Treatment:  Rehabilitation  THERAPY DIAG:  Other low back pain  Pain in left hip  Pain in left thigh  Chronic pain of left knee  Muscle weakness (generalized)  ONSET DATE: Chronic   SUBJECTIVE:          SUBJECTIVE STATEMENT: Patient reports she is doing well, denies any recent pain.   Eval: Patient reports chronic left hip and thigh pain, then in July she had a heart attack. She was discharged in August, and then when she returned home her left hip and knee were bothering her still. She does get some pain in her lower back with lifting heavy objects. The pain can radiate down the left thigh to the knee. The pain in the hip will bother her if she sleeps on her left side. She has been seeing a PT previously working on the left hip. She does use a walking stick occasionally if the left leg flares up. She does feel like when she walks she compensates so will use the walking stick. She does feel like she has made progress regarding the left hip because she has been walking with cardiac rehab and this has gotten much better. She continues to be limited primarily by the pain in the left thigh with walking and standing, and feels like she has to take pressure off the left leg. She does do education officer, environmental  3x/week that focuses on her core, gait, and posture.  She is participating in cardiac rehab 3x/week and was seeing PT 2-3x/week.  PERTINENT HISTORY:  MI in June 2025  PAIN:  Are you having pain? Yes:  NPRS scale: 0/10 currently Pain location: Left thigh to the knee Pain description: Discomfort Aggravating factors: Walking, standing Relieving factors: Rest, stretching, exercise, using walking stick  PRECAUTIONS: Patient is currently participating in cardiac rehab for recent MI  PATIENT GOALS: Pain relief, improving walking and standing tolerance   OBJECTIVE:  Note: Objective measures were completed at Evaluation unless otherwise noted. PATIENT SURVEYS:  PSFS: 5 Walking 22 laps on track / begin  walking on treadmill without pain in left thigh/knee: 4 Standing for long periods due to left thigh pain: 5 Heavy lifting due to lower back: 6  07/05/2024: PSFS: 7.33 Walking 22 laps on track / begin walking on treadmill without pain in left thigh/knee: 6 Standing for long periods due to left thigh pain: 8 Heavy lifting due to lower back: 8  MUSCLE LENGTH: Limitations with bilateral hamstring and hip flexor/quad  POSTURE:   Rounded shoulder posture  PALPATION: Tender to palpation lumbar with hypomobility noted with lumbar CPAs  LUMBAR ROM:   AROM eval  Flexion WFL  Extension 50%  Right lateral flexion   Left lateral flexion   Right rotation 75%  Left rotation 75%   (Blank rows = not tested)  LOWER EXTREMITY ROM:      Hip PROM grossly WFL and non-painful  LOWER EXTREMITY MMT:    MMT Right eval Left eval Left 06/10/2024 Left 07/05/2024  Hip flexion 4- 4-    Hip extension 4- 4-  4-  Hip abduction 3+ 3 3 4-  Hip adduction      Hip internal rotation      Hip external rotation      Knee flexion 5 5    Knee extension 5 5    Ankle dorsiflexion      Ankle plantarflexion      Ankle inversion      Ankle eversion       (Blank rows = not tested)  FUNCTIONAL TESTS:  30 sec stand test: 11 reps  07/05/2024: 15 reps  GAIT: Assistive device utilized: None Level of assistance: Complete Independence Comments: Trendelenburg on left   TREATMENT  [facility] Adult PT Treatment:                                                DATE: 07/05/2024 Treadmill walking beginning at 2.5 mph and gradually increasing to 3.6 mph as patient felt comfortable, x 10 min to improve endurance and workload capacity TRX squat 3 x 10 Deadlift with 30# 3 x 10 TRX alternating reverse lunge 2 x 10 each LAQ with 10# 3 x 15 each SLS with pattern assist using blue band 3 x 30 sec each  PATIENT EDUCATION:  Education details: HEP Person educated: Patient Education method: Programmer, Multimedia, Demonstration,  Tactile cues, Verbal cues Education comprehension: verbalized understanding, returned demonstration, verbal cues required, tactile cues required, and needs further education  HOME EXERCISE PROGRAM: Access Code: [email or web address]   ASSESSMENT: CLINICAL IMPRESSION: Patient tolerated therapy well with no adverse effects. Therapy continues to focus on progressing her strength and activity tolerance. She was able to increase speed of walking on treadmill without any left hip or thigh pain.  She reports improvement in her functional ability and demonstrates improved strength of her left hip, but does continue to exhibit deficits of strength of the left hip and core that continues to impact her walking ability. She is progressing well with her strengthening and continues to participate with personal training and perform exercise independently at home. Patient would benefit from continued skilled PT to progress her strength in order to reduce pain and maximize functional ability, so will extend her PT POC for 8 more weeks.   Eval: Patient is a 68 y.o. female who was seen today for physical therapy evaluation and treatment for chronic left thigh and knee pain that do seem consistent with left lumbar radicular pain, but she also reports pain consistent with left GTPS and left knee pain. She does exhibit mobility deficit of the lumbar spine and gross strength deficit of the core and hip region. Extensive time spent discussing her current exercise and rehab routine as noted above. Patient demonstrated current core and hip exercises and provided patient quadruped pelvic tilt exercises to work of core neuromuscular control. She did report some lower back discomfort with supine core exercises likely due to weakness and mobility deficit, and reports left hip and thigh pain with hip abductor strengthening exercises. Patient would benefit from continued skilled PT to progress mobility and strength in order to reduce pain and  maximize functional ability.  OBJECTIVE IMPAIRMENTS: Abnormal gait, decreased activity tolerance, decreased balance, decreased ROM, decreased strength, hypomobility, impaired flexibility, postural dysfunction, and pain.   ACTIVITY LIMITATIONS: lifting, standing, sleeping, and locomotion level  PARTICIPATION LIMITATIONS: meal prep, cleaning, shopping, and community activity  PERSONAL FACTORS: Fitness, Past/current experiences, and Time since onset of injury/illness/exacerbation are also affecting patient's functional outcome.    GOALS: Goals reviewed with patient? Yes  SHORT TERM GOALS: Target date: 06/08/2024  Patient will be I with initial HEP in order to progress with therapy. Baseline: HEP provided at eval 06/08/2024: independent Goal status: MET  2.  Patient will report left thigh, hip, knee pain </= 2/10 in order to reduce functional limitations and improve walking tolerance Baseline: 4-5/10 06/08/2024: 3-4/10  07/05/2024: denies pain Goal status: MET  LONG TERM GOALS: Target date: 08/30/2024  Patient will be I with final HEP to maintain progress from PT. Baseline: HEP provided at eval 07/05/2024: progressing Goal status: ONGOING  2.  Patient will report PSFS >/= 8 in order to indicate improvement in their functional ability. Baseline: 5 07/05/2024: 7.33 Goal status: ONGOING  3.  Patient will demonstrate gross hip strength >/= 4/5 MMT to improve her walking and standing tolerance Baseline: see limitations above 07/05/2024: 4-/5 MMT Goal status: ONGOING  4.  Patient will perform 30 sec stand test >/= 15 reps to indicate improved strength and endurance for better walking and standing tolerance Baseline: 11 reps 07/05/2024: 15 reps Goal status: MET   PLAN: PT FREQUENCY: 1-2x/week  PT DURATION: 8 weeks  PLANNED INTERVENTIONS: 97164- PT Re-evaluation, 97750- Physical Performance Testing, 97110-Therapeutic exercises, 97530- Therapeutic activity, 97112- Neuromuscular  re-education, 97535- Self Care, 02859- Manual therapy, 97116- Gait training, [phone] (1-2 muscles), 20561 (3+ muscles)- Dry Needling, Patient/Family education, Balance training, Joint mobilization, Joint manipulation, Spinal manipulation, Spinal mobilization, Cryotherapy, and Moist heat.  PLAN FOR NEXT SESSION: Review HEP and progress PRN, manual for lumbar mobility, progress lumbar motion and stretching, progress hip and core strengthening/stabilization   Elaine Daring, PT, DPT, LAT, ATC 07/05/2024  10:16 AM Phone: [phone] Fax: [phone]  "

## 2024-07-06 ENCOUNTER — Ambulatory Visit: Admitting: Family Medicine

## 2024-07-07 ENCOUNTER — Ambulatory Visit (INDEPENDENT_AMBULATORY_CARE_PROVIDER_SITE_OTHER): Admitting: Physical Therapy

## 2024-07-07 ENCOUNTER — Encounter: Payer: Self-pay | Admitting: Physical Therapy

## 2024-07-07 ENCOUNTER — Other Ambulatory Visit: Payer: Self-pay

## 2024-07-07 DIAGNOSIS — M79652 Pain in left thigh: Secondary | ICD-10-CM

## 2024-07-07 DIAGNOSIS — M25552 Pain in left hip: Secondary | ICD-10-CM | POA: Diagnosis not present

## 2024-07-07 DIAGNOSIS — G8929 Other chronic pain: Secondary | ICD-10-CM

## 2024-07-07 DIAGNOSIS — M25562 Pain in left knee: Secondary | ICD-10-CM | POA: Diagnosis not present

## 2024-07-07 DIAGNOSIS — M6281 Muscle weakness (generalized): Secondary | ICD-10-CM

## 2024-07-07 DIAGNOSIS — M5459 Other low back pain: Secondary | ICD-10-CM | POA: Diagnosis not present

## 2024-07-07 NOTE — Therapy (Signed)
 " OUTPATIENT PHYSICAL THERAPY TREATMENT   Patient Name: Debbie [name] MRN: [id number] DOB:Jul 29, 1955, 68 y.o., female Today's Date: 07/07/2024   END OF SESSION:  PT End of Session - 07/07/24 1011     Visit Number 15    Number of Visits 25    Date for Recertification  08/30/24    Authorization Type BCBS    PT Start Time [phone]    PT Stop Time 1018    PT Time Calculation (min) 40 min    Activity Tolerance Patient tolerated treatment well    Behavior During Therapy [facility] for tasks assessed/performed                        Past Medical History:  Diagnosis Date   Genital warts    Hyperlipidemia    Hypertension    Myocardial infarction [facility])    Peripheral arterial disease    Status post angioplasty with stent    Known femoral stent occlusion   Past Surgical History:  Procedure Laterality Date   ABDOMINAL AORTAGRAM N/A 08/09/2011   Procedure: ABDOMINAL EZELLA;  Surgeon: Carlin FORBES Haddock, MD;  Location: [facility] CATH LAB;  Service: Cardiovascular;  Laterality: N/A;   ANGIOPLASTY / STENTING FEMORAL  2008   Right femoral artery stenting done in [place]    COLONOSCOPY WITH PROPOFOL  N/A 06/11/2021   Procedure: COLONOSCOPY WITH PROPOFOL ;  Surgeon: Therisa Bi, MD;  Location: [facility] ENDOSCOPY;  Service: Gastroenterology;  Laterality: N/A;  2ND ARRIVAL, PLEASE   FEMORAL BYPASS Right 08/2011   Dr. Sherre at [facility]   Patient Active Problem List   Diagnosis Date Noted   Syncope and collapse 02/19/2024   Lumbar radiculopathy 01/07/2024   Colon cancer (HCC) 08/14/2021   Hypertension 11/28/2017   Vitamin D  deficiency 05/13/2012   Hypomagnesemia 11/11/2011   Hyperlipidemia 09/06/2011   Prediabetes 09/06/2011   PAD (peripheral artery disease) 08/01/2011   Pain in limb 08/01/2011   S/P angioplasty with stent 08/01/2011    PCP: Eliverto Bette Hover, MD  REFERRING PROVIDER: Joane Artist RAMAN, MD  REFERRING DIAG: Lumbar radiculopathy  Rationale for Evaluation and Treatment:  Rehabilitation  THERAPY DIAG:  Other low back pain  Pain in left hip  Pain in left thigh  Chronic pain of left knee  Muscle weakness (generalized)  ONSET DATE: Chronic   SUBJECTIVE:          SUBJECTIVE STATEMENT: Patient reports she is doing well, denies any current or recent pain.   Eval: Patient reports chronic left hip and thigh pain, then in July she had a heart attack. She was discharged in August, and then when she returned home her left hip and knee were bothering her still. She does get some pain in her lower back with lifting heavy objects. The pain can radiate down the left thigh to the knee. The pain in the hip will bother her if she sleeps on her left side. She has been seeing a PT previously working on the left hip. She does use a walking stick occasionally if the left leg flares up. She does feel like when she walks she compensates so will use the walking stick. She does feel like she has made progress regarding the left hip because she has been walking with cardiac rehab and this has gotten much better. She continues to be limited primarily by the pain in the left thigh with walking and standing, and feels like she has to take pressure off the left leg. She does  do personal training 3x/week that focuses on her core, gait, and posture.  She is participating in cardiac rehab 3x/week and was seeing PT 2-3x/week.  PERTINENT HISTORY:  MI in June 2025  PAIN:  Are you having pain? Yes:  NPRS scale: 0/10 currently Pain location: Left thigh to the knee Pain description: Discomfort Aggravating factors: Walking, standing Relieving factors: Rest, stretching, exercise, using walking stick  PRECAUTIONS: Patient is currently participating in cardiac rehab for recent MI  PATIENT GOALS: Pain relief, improving walking and standing tolerance   OBJECTIVE:  Note: Objective measures were completed at Evaluation unless otherwise noted. PATIENT SURVEYS:  PSFS: 5 Walking 22 laps on  track / begin walking on treadmill without pain in left thigh/knee: 4 Standing for long periods due to left thigh pain: 5 Heavy lifting due to lower back: 6  07/05/2024: PSFS: 7.33 Walking 22 laps on track / begin walking on treadmill without pain in left thigh/knee: 6 Standing for long periods due to left thigh pain: 8 Heavy lifting due to lower back: 8  MUSCLE LENGTH: Limitations with bilateral hamstring and hip flexor/quad  POSTURE:   Rounded shoulder posture  PALPATION: Tender to palpation lumbar with hypomobility noted with lumbar CPAs  LUMBAR ROM:   AROM eval  Flexion WFL  Extension 50%  Right lateral flexion   Left lateral flexion   Right rotation 75%  Left rotation 75%   (Blank rows = not tested)  LOWER EXTREMITY ROM:      Hip PROM grossly WFL and non-painful  LOWER EXTREMITY MMT:    MMT Right eval Left eval Left 06/10/2024 Left 07/05/2024  Hip flexion 4- 4-    Hip extension 4- 4-  4-  Hip abduction 3+ 3 3 4-  Hip adduction      Hip internal rotation      Hip external rotation      Knee flexion 5 5    Knee extension 5 5    Ankle dorsiflexion      Ankle plantarflexion      Ankle inversion      Ankle eversion       (Blank rows = not tested)  FUNCTIONAL TESTS:  30 sec stand test: 11 reps  07/05/2024: 15 reps  GAIT: Assistive device utilized: None Level of assistance: Complete Independence Comments: Trendelenburg on left   TREATMENT  [facility] Adult PT Treatment:                                                DATE: 07/07/2024 Treadmill walking beginning at 2.6 mph and gradually increasing to 3.6 mph as patient felt comfortable, x 10 min to improve endurance and workload capacity Figure-4 bridge 2 x 12 each Modified side plank 3 x 5 x 10 sec each Bulgarian split squat using FR for support 3 x 10 each Deadlift with 30# 3 x 10  PATIENT EDUCATION:  Education details: HEP Person educated: Patient Education method: Programmer, Multimedia, Demonstration, Actor  cues, Verbal cues Education comprehension: verbalized understanding, returned demonstration, verbal cues required, tactile cues required, and needs further education  HOME EXERCISE PROGRAM: Access Code: [email or web address]   ASSESSMENT: CLINICAL IMPRESSION: Patient tolerated therapy well with no adverse effects. Therapy focused on progressing her core, hip, and LE strengthening, and incorporated more single leg strengthening this visit. She is progressing well with her walking tolerance and strengthening exercises, but  does continue to report left hip and thigh feels weaker than the right. No changes made to her HEP this visit. Patient would benefit from continued skilled PT to progress her strength in order to reduce pain and maximize functional ability.   Eval: Patient is a 68 y.o. female who was seen today for physical therapy evaluation and treatment for chronic left thigh and knee pain that do seem consistent with left lumbar radicular pain, but she also reports pain consistent with left GTPS and left knee pain. She does exhibit mobility deficit of the lumbar spine and gross strength deficit of the core and hip region. Extensive time spent discussing her current exercise and rehab routine as noted above. Patient demonstrated current core and hip exercises and provided patient quadruped pelvic tilt exercises to work of core neuromuscular control. She did report some lower back discomfort with supine core exercises likely due to weakness and mobility deficit, and reports left hip and thigh pain with hip abductor strengthening exercises. Patient would benefit from continued skilled PT to progress mobility and strength in order to reduce pain and maximize functional ability.  OBJECTIVE IMPAIRMENTS: Abnormal gait, decreased activity tolerance, decreased balance, decreased ROM, decreased strength, hypomobility, impaired flexibility, postural dysfunction, and pain.   ACTIVITY LIMITATIONS: lifting, standing,  sleeping, and locomotion level  PARTICIPATION LIMITATIONS: meal prep, cleaning, shopping, and community activity  PERSONAL FACTORS: Fitness, Past/current experiences, and Time since onset of injury/illness/exacerbation are also affecting patient's functional outcome.    GOALS: Goals reviewed with patient? Yes  SHORT TERM GOALS: Target date: 06/08/2024  Patient will be I with initial HEP in order to progress with therapy. Baseline: HEP provided at eval 06/08/2024: independent Goal status: MET  2.  Patient will report left thigh, hip, knee pain </= 2/10 in order to reduce functional limitations and improve walking tolerance Baseline: 4-5/10 06/08/2024: 3-4/10  07/05/2024: denies pain Goal status: MET  LONG TERM GOALS: Target date: 08/30/2024  Patient will be I with final HEP to maintain progress from PT. Baseline: HEP provided at eval 07/05/2024: progressing Goal status: ONGOING  2.  Patient will report PSFS >/= 8 in order to indicate improvement in their functional ability. Baseline: 5 07/05/2024: 7.33 Goal status: ONGOING  3.  Patient will demonstrate gross hip strength >/= 4/5 MMT to improve her walking and standing tolerance Baseline: see limitations above 07/05/2024: 4-/5 MMT Goal status: ONGOING  4.  Patient will perform 30 sec stand test >/= 15 reps to indicate improved strength and endurance for better walking and standing tolerance Baseline: 11 reps 07/05/2024: 15 reps Goal status: MET   PLAN: PT FREQUENCY: 1-2x/week  PT DURATION: 8 weeks  PLANNED INTERVENTIONS: 97164- PT Re-evaluation, 97750- Physical Performance Testing, 97110-Therapeutic exercises, 97530- Therapeutic activity, 97112- Neuromuscular re-education, 97535- Self Care, 02859- Manual therapy, 97116- Gait training, [phone] (1-2 muscles), 20561 (3+ muscles)- Dry Needling, Patient/Family education, Balance training, Joint mobilization, Joint manipulation, Spinal manipulation, Spinal mobilization,  Cryotherapy, and Moist heat.  PLAN FOR NEXT SESSION: Review HEP and progress PRN, manual for lumbar mobility, progress lumbar motion and stretching, progress hip and core strengthening/stabilization   Elaine Daring, PT, DPT, LAT, ATC 07/07/2024  11:28 AM Phone: [phone] Fax: [phone]  "

## 2024-07-09 ENCOUNTER — Other Ambulatory Visit (HOSPITAL_BASED_OUTPATIENT_CLINIC_OR_DEPARTMENT_OTHER): Payer: Self-pay

## 2024-07-09 MED ORDER — ZOSTER VAC RECOMB ADJUVANTED 50 MCG/0.5ML IM SUSR
0.5000 mL | Freq: Once | INTRAMUSCULAR | 0 refills | Status: AC
  Filled 2024-07-09: qty 1, 1d supply, fill #0

## 2024-07-10 ENCOUNTER — Other Ambulatory Visit: Payer: Self-pay | Admitting: Cardiology

## 2024-07-15 ENCOUNTER — Encounter: Admitting: Physical Therapy

## 2024-07-15 ENCOUNTER — Other Ambulatory Visit: Payer: Self-pay

## 2024-07-15 ENCOUNTER — Ambulatory Visit (INDEPENDENT_AMBULATORY_CARE_PROVIDER_SITE_OTHER): Admitting: Physical Therapy

## 2024-07-15 ENCOUNTER — Encounter: Payer: Self-pay | Admitting: Physical Therapy

## 2024-07-15 DIAGNOSIS — M5459 Other low back pain: Secondary | ICD-10-CM | POA: Diagnosis not present

## 2024-07-15 DIAGNOSIS — M79652 Pain in left thigh: Secondary | ICD-10-CM

## 2024-07-15 DIAGNOSIS — M6281 Muscle weakness (generalized): Secondary | ICD-10-CM | POA: Diagnosis not present

## 2024-07-15 DIAGNOSIS — M25552 Pain in left hip: Secondary | ICD-10-CM

## 2024-07-15 DIAGNOSIS — M25562 Pain in left knee: Secondary | ICD-10-CM

## 2024-07-15 DIAGNOSIS — G8929 Other chronic pain: Secondary | ICD-10-CM

## 2024-07-15 NOTE — Therapy (Signed)
 " OUTPATIENT PHYSICAL THERAPY TREATMENT   Patient Name: Debbie [name] MRN: [id number] DOB:April 30, 1956, 69 y.o., female Today's Date: 07/15/2024   END OF SESSION:  PT End of Session - 07/15/24 1154     Visit Number 16    Number of Visits 25    Date for Recertification  08/30/24    Authorization Type BCBS    PT Start Time 1151    PT Stop Time 1230    PT Time Calculation (min) 39 min    Activity Tolerance Patient tolerated treatment well    Behavior During Therapy WFL for tasks assessed/performed                         Past Medical History:  Diagnosis Date   Genital warts    Hyperlipidemia    Hypertension    Myocardial infarction (HCC)    Peripheral arterial disease    Status post angioplasty with stent    Known femoral stent occlusion   Past Surgical History:  Procedure Laterality Date   ABDOMINAL AORTAGRAM N/A 08/09/2011   Procedure: ABDOMINAL EZELLA;  Surgeon: Carlin FORBES Haddock, MD;  Location: [facility] CATH LAB;  Service: Cardiovascular;  Laterality: N/A;   ANGIOPLASTY / STENTING FEMORAL  2008   Right femoral artery stenting done in [place]    COLONOSCOPY WITH PROPOFOL  N/A 06/11/2021   Procedure: COLONOSCOPY WITH PROPOFOL ;  Surgeon: Therisa Bi, MD;  Location: [facility] ENDOSCOPY;  Service: Gastroenterology;  Laterality: N/A;  2ND ARRIVAL, PLEASE   FEMORAL BYPASS Right 08/2011   Dr. Sherre at [facility]   Patient Active Problem List   Diagnosis Date Noted   Syncope and collapse 02/19/2024   Lumbar radiculopathy 01/07/2024   Colon cancer (HCC) 08/14/2021   Hypertension 11/28/2017   Vitamin D  deficiency 05/13/2012   Hypomagnesemia 11/11/2011   Hyperlipidemia 09/06/2011   Prediabetes 09/06/2011   PAD (peripheral artery disease) 08/01/2011   Pain in limb 08/01/2011   S/P angioplasty with stent 08/01/2011    PCP: Eliverto Bette Hover, MD  REFERRING PROVIDER: Joane Artist RAMAN, MD  REFERRING DIAG: Lumbar radiculopathy  Rationale for Evaluation and Treatment:  Rehabilitation  THERAPY DIAG:  Other low back pain  Pain in left hip  Pain in left thigh  Chronic pain of left knee  Muscle weakness (generalized)  ONSET DATE: Chronic   SUBJECTIVE:          SUBJECTIVE STATEMENT: Patient reports she is doing well, she has started back at work without any issue. She has been going to the gym and walked on the treadmill for 30 minutes.   Eval: Patient reports chronic left hip and thigh pain, then in July she had a heart attack. She was discharged in August, and then when she returned home her left hip and knee were bothering her still. She does get some pain in her lower back with lifting heavy objects. The pain can radiate down the left thigh to the knee. The pain in the hip will bother her if she sleeps on her left side. She has been seeing a PT previously working on the left hip. She does use a walking stick occasionally if the left leg flares up. She does feel like when she walks she compensates so will use the walking stick. She does feel like she has made progress regarding the left hip because she has been walking with cardiac rehab and this has gotten much better. She continues to be limited primarily by the pain in the left  thigh with walking and standing, and feels like she has to take pressure off the left leg. She does do personal training 3x/week that focuses on her core, gait, and posture.  She is participating in cardiac rehab 3x/week and was seeing PT 2-3x/week.  PERTINENT HISTORY:  MI in June 2025  PAIN:  Are you having pain? Yes:  NPRS scale: 0/10 currently Pain location: Left thigh to the knee Pain description: Discomfort Aggravating factors: Walking, standing Relieving factors: Rest, stretching, exercise, using walking stick  PRECAUTIONS: Patient is currently participating in cardiac rehab for recent MI  PATIENT GOALS: Pain relief, improving walking and standing tolerance   OBJECTIVE:  Note: Objective measures were completed  at Evaluation unless otherwise noted. PATIENT SURVEYS:  PSFS: 5 Walking 22 laps on track / begin walking on treadmill without pain in left thigh/knee: 4 Standing for long periods due to left thigh pain: 5 Heavy lifting due to lower back: 6  07/05/2024: PSFS: 7.33 Walking 22 laps on track / begin walking on treadmill without pain in left thigh/knee: 6 Standing for long periods due to left thigh pain: 8 Heavy lifting due to lower back: 8  MUSCLE LENGTH: Limitations with bilateral hamstring and hip flexor/quad  POSTURE:   Rounded shoulder posture  PALPATION: Tender to palpation lumbar with hypomobility noted with lumbar CPAs  LUMBAR ROM:   AROM eval  Flexion WFL  Extension 50%  Right lateral flexion   Left lateral flexion   Right rotation 75%  Left rotation 75%   (Blank rows = not tested)  LOWER EXTREMITY ROM:      Hip PROM grossly WFL and non-painful  LOWER EXTREMITY MMT:    MMT Right eval Left eval Left 06/10/2024 Left 07/05/2024  Hip flexion 4- 4-    Hip extension 4- 4-  4-  Hip abduction 3+ 3 3 4-  Hip adduction      Hip internal rotation      Hip external rotation      Knee flexion 5 5    Knee extension 5 5    Ankle dorsiflexion      Ankle plantarflexion      Ankle inversion      Ankle eversion       (Blank rows = not tested)  FUNCTIONAL TESTS:  30 sec stand test: 11 reps  07/05/2024: 15 reps  GAIT: Assistive device utilized: None Level of assistance: Complete Independence Comments: Trendelenburg on left   TREATMENT  [facility] Adult PT Treatment:                                                DATE: 07/15/2024 Treadmill walking at 3.0 mph x 10 min to improve endurance and workload capacity Deadlift with 30# 3 x 10 Forward runner step-up on 8 box 3 x 10 each LAQ with 10# 3 x 15 each  PATIENT EDUCATION:  Education details: HEP Person educated: Patient Education method: Programmer, Multimedia, Demonstration, Tactile cues, Verbal cues Education comprehension:  verbalized understanding, returned demonstration, verbal cues required, tactile cues required, and needs further education  HOME EXERCISE PROGRAM: Access Code: [email or web address]   ASSESSMENT: CLINICAL IMPRESSION: Patient tolerated therapy well with no adverse effects. Therapy continues to focus on progressing her strengthening and endurance with good tolerance. She does report she has been pain free and is progressing well with her gym based exercises.She did not  report any increased pain with therapy this visit and was able to progress with her strengthening. No changes made to her HEP this visit. Patient would benefit from continued skilled PT to progress her strength in order to reduce pain and maximize functional ability.   Eval: Patient is a 69 y.o. female who was seen today for physical therapy evaluation and treatment for chronic left thigh and knee pain that do seem consistent with left lumbar radicular pain, but she also reports pain consistent with left GTPS and left knee pain. She does exhibit mobility deficit of the lumbar spine and gross strength deficit of the core and hip region. Extensive time spent discussing her current exercise and rehab routine as noted above. Patient demonstrated current core and hip exercises and provided patient quadruped pelvic tilt exercises to work of core neuromuscular control. She did report some lower back discomfort with supine core exercises likely due to weakness and mobility deficit, and reports left hip and thigh pain with hip abductor strengthening exercises. Patient would benefit from continued skilled PT to progress mobility and strength in order to reduce pain and maximize functional ability.  OBJECTIVE IMPAIRMENTS: Abnormal gait, decreased activity tolerance, decreased balance, decreased ROM, decreased strength, hypomobility, impaired flexibility, postural dysfunction, and pain.   ACTIVITY LIMITATIONS: lifting, standing, sleeping, and locomotion  level  PARTICIPATION LIMITATIONS: meal prep, cleaning, shopping, and community activity  PERSONAL FACTORS: Fitness, Past/current experiences, and Time since onset of injury/illness/exacerbation are also affecting patient's functional outcome.    GOALS: Goals reviewed with patient? Yes  SHORT TERM GOALS: Target date: 06/08/2024  Patient will be I with initial HEP in order to progress with therapy. Baseline: HEP provided at eval 06/08/2024: independent Goal status: MET  2.  Patient will report left thigh, hip, knee pain </= 2/10 in order to reduce functional limitations and improve walking tolerance Baseline: 4-5/10 06/08/2024: 3-4/10  07/05/2024: denies pain Goal status: MET  LONG TERM GOALS: Target date: 08/30/2024  Patient will be I with final HEP to maintain progress from PT. Baseline: HEP provided at eval 07/05/2024: progressing Goal status: ONGOING  2.  Patient will report PSFS >/= 8 in order to indicate improvement in their functional ability. Baseline: 5 07/05/2024: 7.33 Goal status: ONGOING  3.  Patient will demonstrate gross hip strength >/= 4/5 MMT to improve her walking and standing tolerance Baseline: see limitations above 07/05/2024: 4-/5 MMT Goal status: ONGOING  4.  Patient will perform 30 sec stand test >/= 15 reps to indicate improved strength and endurance for better walking and standing tolerance Baseline: 11 reps 07/05/2024: 15 reps Goal status: MET   PLAN: PT FREQUENCY: 1-2x/week  PT DURATION: 8 weeks  PLANNED INTERVENTIONS: 97164- PT Re-evaluation, 97750- Physical Performance Testing, 97110-Therapeutic exercises, 97530- Therapeutic activity, 97112- Neuromuscular re-education, 97535- Self Care, 02859- Manual therapy, 97116- Gait training, [phone] (1-2 muscles), 20561 (3+ muscles)- Dry Needling, Patient/Family education, Balance training, Joint mobilization, Joint manipulation, Spinal manipulation, Spinal mobilization, Cryotherapy, and Moist  heat.  PLAN FOR NEXT SESSION: Review HEP and progress PRN, manual for lumbar mobility, progress lumbar motion and stretching, progress hip and core strengthening/stabilization   Elaine Daring, PT, DPT, LAT, ATC 07/15/2024  12:51 PM Phone: [phone] Fax: [phone]  "

## 2024-07-22 ENCOUNTER — Ambulatory Visit: Admitting: Physical Therapy

## 2024-07-22 ENCOUNTER — Encounter: Payer: Self-pay | Admitting: Physical Therapy

## 2024-07-22 ENCOUNTER — Other Ambulatory Visit: Payer: Self-pay

## 2024-07-22 DIAGNOSIS — M79652 Pain in left thigh: Secondary | ICD-10-CM

## 2024-07-22 DIAGNOSIS — M6281 Muscle weakness (generalized): Secondary | ICD-10-CM

## 2024-07-22 DIAGNOSIS — M25562 Pain in left knee: Secondary | ICD-10-CM | POA: Diagnosis not present

## 2024-07-22 DIAGNOSIS — G8929 Other chronic pain: Secondary | ICD-10-CM | POA: Diagnosis not present

## 2024-07-22 DIAGNOSIS — M5459 Other low back pain: Secondary | ICD-10-CM

## 2024-07-22 DIAGNOSIS — M25552 Pain in left hip: Secondary | ICD-10-CM

## 2024-07-22 NOTE — Therapy (Signed)
 " OUTPATIENT PHYSICAL THERAPY TREATMENT   Patient Name: Debbie [name] MRN: [id number] DOB:02/01/1956, 69 y.o., female Today's Date: 07/22/2024   END OF SESSION:  PT End of Session - 07/22/24 0930     Visit Number 17    Number of Visits 25    Date for Recertification  08/30/24    Authorization Type [employer]    PT Start Time 0930    PT Stop Time 1010    PT Time Calculation (min) 40 min    Activity Tolerance Patient tolerated treatment well    Behavior During Therapy WFL for tasks assessed/performed                          Past Medical History:  Diagnosis Date   Genital warts    Hyperlipidemia    Hypertension    Myocardial infarction (HCC)    Peripheral arterial disease    Status post angioplasty with stent    Known femoral stent occlusion   Past Surgical History:  Procedure Laterality Date   ABDOMINAL AORTAGRAM N/A 08/09/2011   Procedure: ABDOMINAL EZELLA;  Surgeon: Carlin FORBES Haddock, MD;  Location: [facility] CATH LAB;  Service: Cardiovascular;  Laterality: N/A;   ANGIOPLASTY / STENTING FEMORAL  2008   Right femoral artery stenting done in [place]    COLONOSCOPY WITH PROPOFOL  N/A 06/11/2021   Procedure: COLONOSCOPY WITH PROPOFOL ;  Surgeon: Therisa Bi, MD;  Location: [facility] ENDOSCOPY;  Service: Gastroenterology;  Laterality: N/A;  2ND ARRIVAL, PLEASE   FEMORAL BYPASS Right 08/2011   Dr. Sherre at [facility]   Patient Active Problem List   Diagnosis Date Noted   Syncope and collapse 02/19/2024   Lumbar radiculopathy 01/07/2024   Colon cancer (HCC) 08/14/2021   Hypertension 11/28/2017   Vitamin D  deficiency 05/13/2012   Hypomagnesemia 11/11/2011   Hyperlipidemia 09/06/2011   Prediabetes 09/06/2011   PAD (peripheral artery disease) 08/01/2011   Pain in limb 08/01/2011   S/P angioplasty with stent 08/01/2011    PCP: Eliverto Bette Hover, MD  REFERRING PROVIDER: Joane Artist RAMAN, MD  REFERRING DIAG: Lumbar radiculopathy  Rationale for Evaluation and Treatment:  Rehabilitation  THERAPY DIAG:  Other low back pain  Pain in left hip  Pain in left thigh  Chronic pain of left knee  Muscle weakness (generalized)  ONSET DATE: Chronic   SUBJECTIVE:          SUBJECTIVE STATEMENT: Patient reports her left knee is still bruised and tender from when she missed a step last week, she also has noticed her right ankle swelling. She has been going to the gym every morning, and has been using the treadmill, rower, and elliptical. She continues to deny any pain.   Eval: Patient reports chronic left hip and thigh pain, then in July she had a heart attack. She was discharged in August, and then when she returned home her left hip and knee were bothering her still. She does get some pain in her lower back with lifting heavy objects. The pain can radiate down the left thigh to the knee. The pain in the hip will bother her if she sleeps on her left side. She has been seeing a PT previously working on the left hip. She does use a walking stick occasionally if the left leg flares up. She does feel like when she walks she compensates so will use the walking stick. She does feel like she has made progress regarding the left hip because she has been walking  with cardiac rehab and this has gotten much better. She continues to be limited primarily by the pain in the left thigh with walking and standing, and feels like she has to take pressure off the left leg. She does do personal training 3x/week that focuses on her core, gait, and posture.  She is participating in cardiac rehab 3x/week and was seeing PT 2-3x/week.  PERTINENT HISTORY:  MI in June 2025  PAIN:  Are you having pain? Yes:  NPRS scale: 0/10 currently Pain location: Left thigh to the knee Pain description: Discomfort Aggravating factors: Walking, standing Relieving factors: Rest, stretching, exercise, using walking stick  PRECAUTIONS: Patient is currently participating in cardiac rehab for recent MI  PATIENT  GOALS: Pain relief, improving walking and standing tolerance   OBJECTIVE:  Note: Objective measures were completed at Evaluation unless otherwise noted. PATIENT SURVEYS:  PSFS: 5 Walking 22 laps on track / begin walking on treadmill without pain in left thigh/knee: 4 Standing for long periods due to left thigh pain: 5 Heavy lifting due to lower back: 6  07/05/2024: PSFS: 7.33 Walking 22 laps on track / begin walking on treadmill without pain in left thigh/knee: 6 Standing for long periods due to left thigh pain: 8 Heavy lifting due to lower back: 8  MUSCLE LENGTH: Limitations with bilateral hamstring and hip flexor/quad  POSTURE:   Rounded shoulder posture  PALPATION: Tender to palpation lumbar with hypomobility noted with lumbar CPAs  LUMBAR ROM:   AROM eval  Flexion WFL  Extension 50%  Right lateral flexion   Left lateral flexion   Right rotation 75%  Left rotation 75%   (Blank rows = not tested)  LOWER EXTREMITY ROM:      Hip PROM grossly WFL and non-painful  LOWER EXTREMITY MMT:    MMT Right eval Left eval Left 06/10/2024 Left 07/05/2024  Hip flexion 4- 4-    Hip extension 4- 4-  4-  Hip abduction 3+ 3 3 4-  Hip adduction      Hip internal rotation      Hip external rotation      Knee flexion 5 5    Knee extension 5 5    Ankle dorsiflexion      Ankle plantarflexion      Ankle inversion      Ankle eversion       (Blank rows = not tested)  FUNCTIONAL TESTS:  30 sec stand test: 11 reps  07/05/2024: 15 reps  GAIT: Assistive device utilized: None Level of assistance: Complete Independence Comments: Trendelenburg on left   TREATMENT  [facility] Adult PT Treatment:                                                DATE: 07/22/2024 Treadmill walking at 3.2 mph x 5 min to improve endurance and workload capacity Deadlift with 35# 3 x 12 Lateral band walk with black at knee 3 x 20 down/back TRX squat 3 x 15 Forward and backward tandem walking  PATIENT  EDUCATION:  Education details: HEP Person educated: Patient Education method: Programmer, Multimedia, Demonstration, Actor cues, Verbal cues Education comprehension: verbalized understanding, returned demonstration, verbal cues required, tactile cues required, and needs further education  HOME EXERCISE PROGRAM: Access Code: [email or web address]   ASSESSMENT: CLINICAL IMPRESSION: Patient tolerated therapy well with no adverse effects. Therapy continues to focus on progressing her  strength and stability with good tolerance. She was able to progress with her hip strengthening this visit. She does not occasionally feeling unsteady when walking up and [place] at [place] so worked on tandem walking with patient demonstrating difficulty especially in backward direction. No changes made to her HEP this visit. Patient would benefit from continued skilled PT to progress her strength in order to reduce pain and maximize functional ability.   Eval: Patient is a 69 y.o. female who was seen today for physical therapy evaluation and treatment for chronic left thigh and knee pain that do seem consistent with left lumbar radicular pain, but she also reports pain consistent with left GTPS and left knee pain. She does exhibit mobility deficit of the lumbar spine and gross strength deficit of the core and hip region. Extensive time spent discussing her current exercise and rehab routine as noted above. Patient demonstrated current core and hip exercises and provided patient quadruped pelvic tilt exercises to work of core neuromuscular control. She did report some lower back discomfort with supine core exercises likely due to weakness and mobility deficit, and reports left hip and thigh pain with hip abductor strengthening exercises. Patient would benefit from continued skilled PT to progress mobility and strength in order to reduce pain and maximize functional ability.  OBJECTIVE IMPAIRMENTS: Abnormal gait, decreased activity  tolerance, decreased balance, decreased ROM, decreased strength, hypomobility, impaired flexibility, postural dysfunction, and pain.   ACTIVITY LIMITATIONS: lifting, standing, sleeping, and locomotion level  PARTICIPATION LIMITATIONS: meal prep, cleaning, shopping, and community activity  PERSONAL FACTORS: Fitness, Past/current experiences, and Time since onset of injury/illness/exacerbation are also affecting patient's functional outcome.    GOALS: Goals reviewed with patient? Yes  SHORT TERM GOALS: Target date: 06/08/2024  Patient will be I with initial HEP in order to progress with therapy. Baseline: HEP provided at eval 06/08/2024: independent Goal status: MET  2.  Patient will report left thigh, hip, knee pain </= 2/10 in order to reduce functional limitations and improve walking tolerance Baseline: 4-5/10 06/08/2024: 3-4/10  07/05/2024: denies pain Goal status: MET  LONG TERM GOALS: Target date: 08/30/2024  Patient will be I with final HEP to maintain progress from PT. Baseline: HEP provided at eval 07/05/2024: progressing Goal status: ONGOING  2.  Patient will report PSFS >/= 8 in order to indicate improvement in their functional ability. Baseline: 5 07/05/2024: 7.33 Goal status: ONGOING  3.  Patient will demonstrate gross hip strength >/= 4/5 MMT to improve her walking and standing tolerance Baseline: see limitations above 07/05/2024: 4-/5 MMT Goal status: ONGOING  4.  Patient will perform 30 sec stand test >/= 15 reps to indicate improved strength and endurance for better walking and standing tolerance Baseline: 11 reps 07/05/2024: 15 reps Goal status: MET   PLAN: PT FREQUENCY: 1-2x/week  PT DURATION: 8 weeks  PLANNED INTERVENTIONS: 97164- PT Re-evaluation, 97750- Physical Performance Testing, 97110-Therapeutic exercises, 97530- Therapeutic activity, 97112- Neuromuscular re-education, 97535- Self Care, [id number]- Manual therapy, 97116- Gait training, [phone] (1-2  muscles), [phone] (3+ muscles)- Dry Needling, Patient/Family education, Balance training, Joint mobilization, Joint manipulation, Spinal manipulation, Spinal mobilization, Cryotherapy, and Moist heat.  PLAN FOR NEXT SESSION: Review HEP and progress PRN, manual for lumbar mobility, progress lumbar motion and stretching, progress hip and core strengthening/stabilization   Elaine Daring, PT, DPT, LAT, ATC 07/22/24  10:12 AM Phone: [phone] Fax: [phone]  "

## 2024-07-23 ENCOUNTER — Ambulatory Visit: Admitting: Clinical

## 2024-07-28 ENCOUNTER — Encounter: Payer: Self-pay | Admitting: Physical Therapy

## 2024-07-29 ENCOUNTER — Encounter: Admitting: Physical Therapy

## 2024-08-05 ENCOUNTER — Encounter: Admitting: Physical Therapy

## 2024-08-10 ENCOUNTER — Other Ambulatory Visit (HOSPITAL_BASED_OUTPATIENT_CLINIC_OR_DEPARTMENT_OTHER): Payer: Self-pay

## 2024-12-15 ENCOUNTER — Ambulatory Visit (HOSPITAL_BASED_OUTPATIENT_CLINIC_OR_DEPARTMENT_OTHER): Payer: Self-pay | Admitting: Obstetrics & Gynecology

## 2024-12-24 ENCOUNTER — Ambulatory Visit (HOSPITAL_BASED_OUTPATIENT_CLINIC_OR_DEPARTMENT_OTHER): Admitting: Obstetrics & Gynecology
